# Patient Record
Sex: Female | Born: 1950 | Race: White | Hispanic: No | Marital: Married | Smoking: Never smoker
Health system: Southern US, Community
[De-identification: ages and names within clinical notes are randomized; demographics above are authoritative.]

## PROBLEM LIST (undated history)

## (undated) DIAGNOSIS — F419 Anxiety disorder, unspecified: Secondary | ICD-10-CM

## (undated) DIAGNOSIS — M1990 Unspecified osteoarthritis, unspecified site: Secondary | ICD-10-CM

## (undated) DIAGNOSIS — F329 Major depressive disorder, single episode, unspecified: Secondary | ICD-10-CM

## (undated) DIAGNOSIS — I639 Cerebral infarction, unspecified: Secondary | ICD-10-CM

## (undated) DIAGNOSIS — E538 Deficiency of other specified B group vitamins: Secondary | ICD-10-CM

## (undated) DIAGNOSIS — G4730 Sleep apnea, unspecified: Secondary | ICD-10-CM

## (undated) DIAGNOSIS — I10 Essential (primary) hypertension: Secondary | ICD-10-CM

## (undated) DIAGNOSIS — F32A Depression, unspecified: Secondary | ICD-10-CM

## (undated) DIAGNOSIS — Z8719 Personal history of other diseases of the digestive system: Secondary | ICD-10-CM

## (undated) DIAGNOSIS — J45909 Unspecified asthma, uncomplicated: Secondary | ICD-10-CM

## (undated) DIAGNOSIS — R0602 Shortness of breath: Secondary | ICD-10-CM

## (undated) DIAGNOSIS — J189 Pneumonia, unspecified organism: Secondary | ICD-10-CM

## (undated) DIAGNOSIS — K219 Gastro-esophageal reflux disease without esophagitis: Secondary | ICD-10-CM

## (undated) DIAGNOSIS — E7800 Pure hypercholesterolemia, unspecified: Secondary | ICD-10-CM

## (undated) HISTORY — DX: Unspecified asthma, uncomplicated: J45.909

## (undated) HISTORY — PX: KNEE ARTHROSCOPY: SUR90

## (undated) HISTORY — DX: Sleep apnea, unspecified: G47.30

## (undated) HISTORY — PX: UMBILICAL HERNIA REPAIR: SHX196

## (undated) HISTORY — PX: COLONOSCOPY: SHX174

## (undated) HISTORY — DX: Cerebral infarction, unspecified: I63.9

## (undated) HISTORY — PX: TUBAL LIGATION: SHX77

---

## 2011-03-11 ENCOUNTER — Other Ambulatory Visit: Payer: Self-pay | Admitting: Otolaryngology

## 2011-04-20 NOTE — H&P (Signed)
Chief Complaint:  right knee pain.  Subjective: Patient is admitted for right total knee arthroplasty.  Patient is a 61 y.o. female who has been seen by Dr. Shelle Iron for ongoing hip pain.  They have been followed and found to have continued progressive pain.  They have been treated conservatively in the past including medications.  Despite conservative measures, they continue to have pain.  X-rays show that the patient has tricompartmental OA of the right knee with slight valgus deformity.  It is felt that they would benefit from undergoing surgical intervention.  Risks and benefits have been discussed with the patient and they elect to proceed with surgery.  The patient has no contraindications to the upcoming procedure such as ongoing infection or progressive neurological disease.  Allergies: NKDA  Medications: Lisinopril Diclofenac Effexor Diclysomin Wellbutrin Advair Pro-air   Past Medical History: HTN Asthma Depression/Aniexty    Past Surgical History: Tubal ligation and hernia repair   Family History: Cancer. father Cerebrovascular Accident. mother and grandmother mothers side Depression. mother, father, sister, brother and grandfather fathers side Diabetes Mellitus. father Hypertension. mother, father, sister, brother, grandmother mothers side, grandfather mothers side, grandmother fathers side and grandfather fathers side Osteoporosis. grandfather mothers side and grandmother fathers side    Social History: Children. 3 Current work status. retired Financial planner (Currently). no Drug/Alcohol Rehab (Previously). no Exercise. Exercises weekly; does running / walking Illicit drug use. yes Living situation. live with spouse Marital status. married Number of flights of stairs before winded. greater than 5 Pain Contract. no Tobacco use. never smoker    Review of Systems General: No chills, fevers, night sweats, fatigue. Neuro:  No seizures,  syncope, paralysis, visual problems. Respiratory:  No shortness of breath, productive cough, hemoptysis. Cardiovascular: No chest pain, angina, palpitations, orthopnea. GI: No nausea, vomiting, diarrhea, constipation. GU:  No dysuria, hematuria, discharge. Musculoskeletal:  Joint pain   Physical Exam:  Vitals: Pulse:70 Respirations:10 Blood Pressure:96/70  GENERAL: Patient is a 61 y.o. female, well-nourished, well-developed, no acute distress. Alert, oriented, cooperative. HENT:  Normocephalic, atraumatic. Pupils round and reactive. EOMs intact. NECK:  Supple, no bruits. CHEST:  Clear to anterior and posterior chest walls. No rhonchi, rales, wheezes. HEART:  Regular, rate and rhythm.  No murmurs.  S1 and S2 noted. ABDOMEN:  Soft, nontender, bowel sounds present. RECTAL/BREAST/GENITALIA:  Not done, not pertinent to present illness. EXTREMITIES: TTP medial joint line right knee, mild effusion, -3 to 110, mild valgus deformity   Assessment/Plan: End stage arthritis, right knee  The patient is being admitted to [facility] to undergo a right total knee arthroplasty.  Surgery will be performed by Dr. Jene Every.  Risks and benefits have been discussed with the patient and they elect to proceed wth the procedure.

## 2011-04-27 ENCOUNTER — Encounter (HOSPITAL_COMMUNITY)
Admission: RE | Admit: 2011-04-27 | Discharge: 2011-04-27 | Disposition: A | Discharge status: Home / Self Care | Payer: BC Managed Care – PPO | Source: Ambulatory Visit | Attending: Specialist | Admitting: Specialist

## 2011-04-27 ENCOUNTER — Encounter (HOSPITAL_COMMUNITY): Payer: Self-pay

## 2011-04-27 ENCOUNTER — Ambulatory Visit (HOSPITAL_COMMUNITY)
Admission: RE | Admit: 2011-04-27 | Discharge: 2011-04-27 | Disposition: A | Discharge status: Home / Self Care | Payer: BC Managed Care – PPO | Source: Ambulatory Visit | Attending: Otolaryngology | Admitting: Otolaryngology

## 2011-04-27 ENCOUNTER — Ambulatory Visit (HOSPITAL_COMMUNITY)
Admission: RE | Admit: 2011-04-27 | Discharge: 2011-04-27 | Disposition: A | Discharge status: Home / Self Care | Payer: BC Managed Care – PPO | Source: Ambulatory Visit | Attending: Specialist | Admitting: Specialist

## 2011-04-27 DIAGNOSIS — M1710 Unilateral primary osteoarthritis, unspecified knee: Secondary | ICD-10-CM | POA: Insufficient documentation

## 2011-04-27 DIAGNOSIS — Z01818 Encounter for other preprocedural examination: Secondary | ICD-10-CM | POA: Insufficient documentation

## 2011-04-27 DIAGNOSIS — Z01812 Encounter for preprocedural laboratory examination: Secondary | ICD-10-CM | POA: Insufficient documentation

## 2011-04-27 HISTORY — DX: Depression, unspecified: F32.A

## 2011-04-27 HISTORY — DX: Essential (primary) hypertension: I10

## 2011-04-27 HISTORY — DX: Shortness of breath: R06.02

## 2011-04-27 HISTORY — DX: Deficiency of other specified B group vitamins: E53.8

## 2011-04-27 HISTORY — DX: Anxiety disorder, unspecified: F41.9

## 2011-04-27 HISTORY — DX: Unspecified osteoarthritis, unspecified site: M19.90

## 2011-04-27 HISTORY — DX: Personal history of other diseases of the digestive system: Z87.19

## 2011-04-27 HISTORY — DX: Pure hypercholesterolemia, unspecified: E78.00

## 2011-04-27 HISTORY — DX: Major depressive disorder, single episode, unspecified: F32.9

## 2011-04-27 HISTORY — DX: Gastro-esophageal reflux disease without esophagitis: K21.9

## 2011-04-27 HISTORY — DX: Pneumonia, unspecified organism: J18.9

## 2011-04-27 LAB — URINALYSIS, ROUTINE W REFLEX MICROSCOPIC
Bilirubin Urine: NEGATIVE
Hgb urine dipstick: NEGATIVE
Ketones, ur: NEGATIVE mg/dL
Nitrite: NEGATIVE
Specific Gravity, Urine: 1.008 (ref 1.005–1.030)
Urobilinogen, UA: 0.2 mg/dL (ref 0.0–1.0)

## 2011-04-27 LAB — COMPREHENSIVE METABOLIC PANEL
ALT: 21 U/L (ref 0–35)
Alkaline Phosphatase: 95 U/L (ref 39–117)
BUN: 19 mg/dL (ref 6–23)
CO2: 26 mEq/L (ref 19–32)
Chloride: 97 mEq/L (ref 96–112)
GFR calc Af Amer: 73 mL/min — ABNORMAL LOW (ref >90)
GFR calc non Af Amer: 63 mL/min — ABNORMAL LOW (ref >90)
Glucose, Bld: 125 mg/dL — ABNORMAL HIGH (ref 70–99)
Potassium: 4 mEq/L (ref 3.5–5.1)
Sodium: 133 mEq/L — ABNORMAL LOW (ref 135–145)
Total Bilirubin: 0.1 mg/dL — ABNORMAL LOW (ref 0.3–1.2)
Total Protein: 7.3 g/dL (ref 6.0–8.3)

## 2011-04-27 LAB — DIFFERENTIAL
Basophils Absolute: 0.1 10*3/uL (ref 0.0–0.1)
Basophils Relative: 2 % — ABNORMAL HIGH (ref 0–1)
Eosinophils Absolute: 0.5 10*3/uL (ref 0.0–0.7)
Eosinophils Relative: 6 % — ABNORMAL HIGH (ref 0–5)
Neutrophils Relative %: 63 % (ref 43–77)

## 2011-04-27 LAB — ABO/RH: ABO/RH(D): O POS

## 2011-04-27 LAB — CBC
HCT: 39.7 % (ref 36.0–46.0)
Hemoglobin: 13.6 g/dL (ref 12.0–15.0)
MCHC: 34.3 g/dL (ref 30.0–36.0)
RBC: 4.15 MIL/uL (ref 3.87–5.11)

## 2011-04-27 LAB — PROTIME-INR
INR: 0.93 (ref 0.00–1.49)
Prothrombin Time: 12.7 seconds (ref 11.6–15.2)

## 2011-04-27 LAB — APTT: aPTT: 30 seconds (ref 24–37)

## 2011-04-27 LAB — SURGICAL PCR SCREEN: Staphylococcus aureus: NEGATIVE

## 2011-04-27 IMAGING — CR DG KNEE 1-2V*R*
2 series · 2 of 2 positions shown · non-contrast
Comparison: None.

CLINICAL DATA: Preoperative evaluation prior to arthroplasty.

RIGHT KNEE - 1-2 VIEW [DATE]:

[t knee ap right]
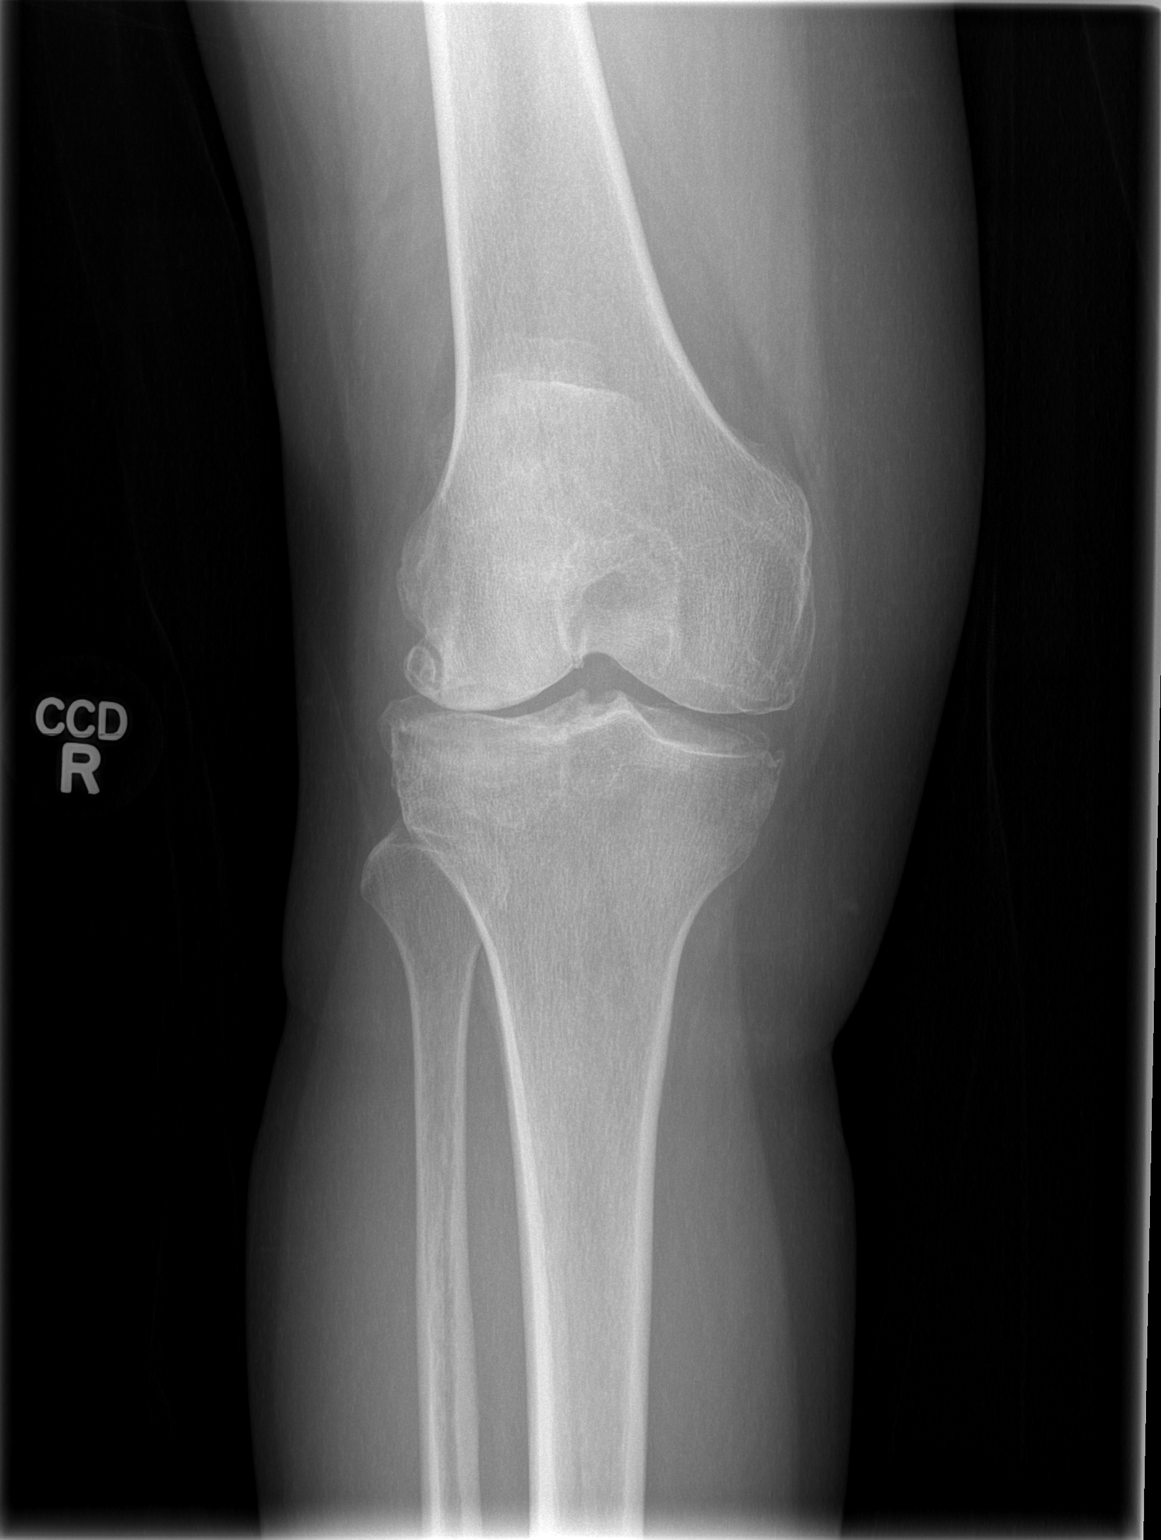

[t knee lat right]
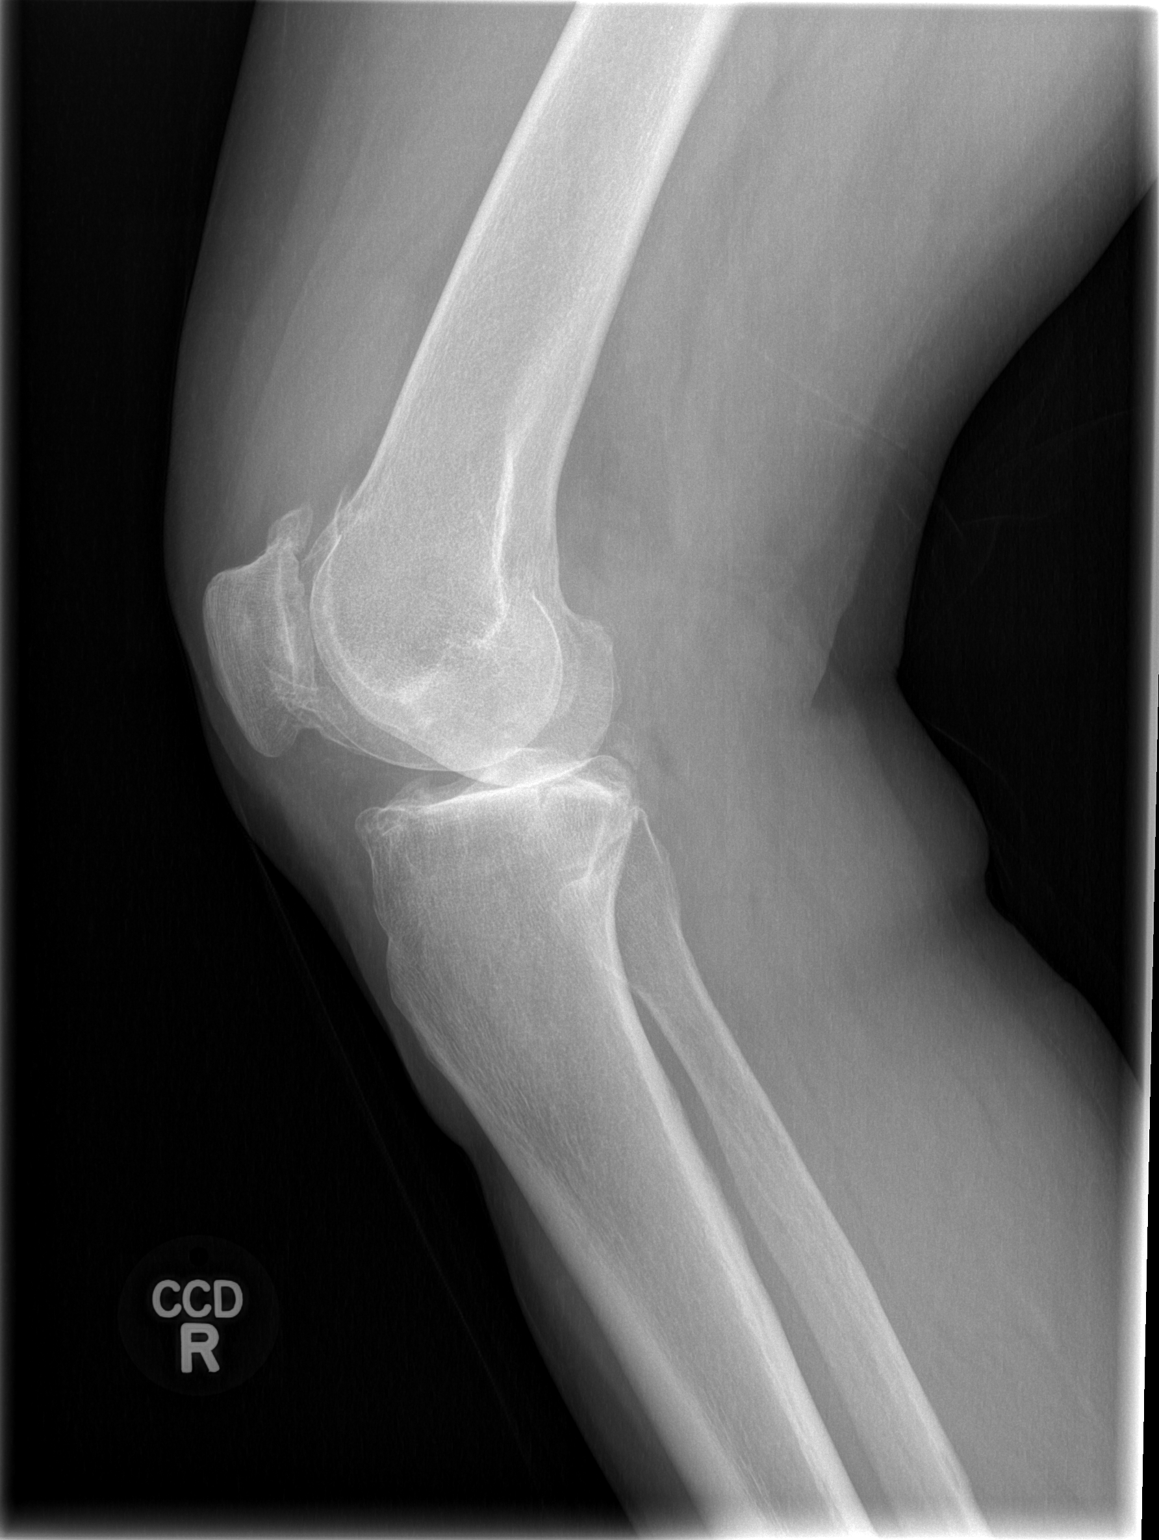

[2 of 2 positions shown; findings below may reference images not displayed]

FINDINGS: Near complete loss of the lateral compartment joint
space.  Severe patellofemoral compartment joint space narrowing and
mild medial compartment joint space narrowing.  Associated
hypertrophic spurring involving all compartments.  Moderately large
joint effusion.  Well-preserved bone mineral density.
IMPRESSION: Tricompartment osteoarthritis, worst in the lateral and
patellofemoral compartments.

## 2011-04-27 MED ORDER — CHLORHEXIDINE GLUCONATE 4 % EX LIQD: 60.0000 mL | Freq: Once | CUTANEOUS | Status: DC

## 2011-04-27 NOTE — Patient Instructions (Signed)
20 Chloe Gray  04/27/2011   Your procedure is scheduled on:  04/30/11  Surgery 0730-1000  Thursday  Report to [facility] at 0515 AM.  Call this number if you have problems the morning of surgery: [phone]   Remember:   Do not eat food:After Midnight.  Wednesday NIGHT  May have clear liquids:until Midnight . Wednesday NIGHT  Clear liquids include soda, tea, black coffee, apple or grape juice, broth.  Take these medicines the morning of surgery with A SIP OF WATER:  Beryle Flock, ZEGRID, EFFEXOR  With sip water     ADVAIR BEFORE YOU COME TO HOSPITAL     MAy use Proventil  If needed    BRING INHALERS WITH YOU TO HOSPITAL   Do not wear jewelry, make-up or nail polish.  Do not wear lotions, powders, or perfumes. You may wear deodorant.  Do not shave 48 hours prior to surgery.  Do not bring valuables to the hospital.  Contacts, dentures or bridgework may not be worn into surgery.  Leave suitcase in the car. After surgery it may be brought to your room.  For patients admitted to the hospital, checkout time is 11:00 AM the day of discharge.   Patients discharged the day of surgery will not be allowed to drive home.  Name and phone number of your driver: husband  Special Instructions: CHG Shower Use Special Wash: 1/2 bottle night before surgery and 1/2 bottle morning of surgery. Regular soap face and privates   Please read over the following fact sheets that you were given: MRSA Information

## 2011-04-30 ENCOUNTER — Encounter (HOSPITAL_COMMUNITY)
Admission: RE | Disposition: A | Discharge status: Home Health | Payer: Self-pay | Source: Ambulatory Visit | Attending: Specialist

## 2011-04-30 ENCOUNTER — Encounter (HOSPITAL_COMMUNITY): Payer: Self-pay | Admitting: Anesthesiology

## 2011-04-30 ENCOUNTER — Inpatient Hospital Stay (HOSPITAL_COMMUNITY): Payer: BC Managed Care – PPO | Admitting: Anesthesiology

## 2011-04-30 ENCOUNTER — Inpatient Hospital Stay (HOSPITAL_COMMUNITY)
Admission: RE | Admit: 2011-04-30 | Discharge: 2011-05-03 | DRG: 209 | Disposition: A | Discharge status: Home Health | Payer: BC Managed Care – PPO | Source: Ambulatory Visit | Attending: Specialist | Admitting: Specialist

## 2011-04-30 ENCOUNTER — Encounter (HOSPITAL_COMMUNITY): Payer: Self-pay | Admitting: *Deleted

## 2011-04-30 ENCOUNTER — Inpatient Hospital Stay (HOSPITAL_COMMUNITY): Payer: BC Managed Care – PPO

## 2011-04-30 DIAGNOSIS — M21869 Other specified acquired deformities of unspecified lower leg: Secondary | ICD-10-CM | POA: Diagnosis present

## 2011-04-30 DIAGNOSIS — M1710 Unilateral primary osteoarthritis, unspecified knee: Principal | ICD-10-CM | POA: Diagnosis present

## 2011-04-30 DIAGNOSIS — I10 Essential (primary) hypertension: Secondary | ICD-10-CM | POA: Diagnosis present

## 2011-04-30 DIAGNOSIS — K219 Gastro-esophageal reflux disease without esophagitis: Secondary | ICD-10-CM | POA: Diagnosis present

## 2011-04-30 DIAGNOSIS — M1711 Unilateral primary osteoarthritis, right knee: Secondary | ICD-10-CM | POA: Diagnosis present

## 2011-04-30 DIAGNOSIS — K449 Diaphragmatic hernia without obstruction or gangrene: Secondary | ICD-10-CM | POA: Diagnosis present

## 2011-04-30 DIAGNOSIS — E871 Hypo-osmolality and hyponatremia: Secondary | ICD-10-CM | POA: Diagnosis not present

## 2011-04-30 DIAGNOSIS — M1990 Unspecified osteoarthritis, unspecified site: Secondary | ICD-10-CM

## 2011-04-30 DIAGNOSIS — J45909 Unspecified asthma, uncomplicated: Secondary | ICD-10-CM | POA: Diagnosis present

## 2011-04-30 HISTORY — PX: TOTAL KNEE ARTHROPLASTY: SHX125

## 2011-04-30 LAB — TYPE AND SCREEN

## 2011-04-30 SURGERY — ARTHROPLASTY, KNEE, TOTAL
Anesthesia: General | Site: Knee | Laterality: Right | Wound class: Clean

## 2011-04-30 MED ORDER — PROPOFOL 10 MG/ML IV EMUL
INTRAVENOUS | Status: DC | PRN
  Administered 2011-04-30: 140 mg via INTRAVENOUS

## 2011-04-30 MED ORDER — ACETAMINOPHEN 650 MG RE SUPP: 650.0000 mg | Freq: Four times a day (QID) | RECTAL | Status: DC | PRN

## 2011-04-30 MED ORDER — SODIUM CHLORIDE 0.9 % IR SOLN
Status: DC | PRN
  Administered 2011-04-30: 3000 mL

## 2011-04-30 MED ORDER — PHENOL 1.4 % MT LIQD
1.0000 | OROMUCOSAL | Status: DC | PRN
  Filled 2011-04-30: qty 177

## 2011-04-30 MED ORDER — OXYCODONE HCL 5 MG PO TABS
5.0000 mg | ORAL_TABLET | ORAL | Status: DC
  Administered 2011-04-30 – 2011-05-02 (×12): 10 mg via ORAL
  Filled 2011-04-30 (×12): qty 2

## 2011-04-30 MED ORDER — BUPIVACAINE-EPINEPHRINE 0.5% -1:200000 IJ SOLN
INTRAMUSCULAR | Status: DC | PRN
  Administered 2011-04-30: 27 mL

## 2011-04-30 MED ORDER — DOCUSATE SODIUM 100 MG PO CAPS
100.0000 mg | ORAL_CAPSULE | Freq: Two times a day (BID) | ORAL | Status: DC
  Administered 2011-04-30 – 2011-05-03 (×7): 100 mg via ORAL
  Filled 2011-04-30 (×8): qty 1

## 2011-04-30 MED ORDER — ACETAMINOPHEN 325 MG PO TABS
650.0000 mg | ORAL_TABLET | Freq: Four times a day (QID) | ORAL | Status: DC | PRN
  Administered 2011-05-01: 650 mg via ORAL
  Filled 2011-04-30: qty 2

## 2011-04-30 MED ORDER — ACETAMINOPHEN 10 MG/ML IV SOLN
INTRAVENOUS | Status: DC | PRN
  Administered 2011-04-30: 1000 mg via INTRAVENOUS

## 2011-04-30 MED ORDER — MIDAZOLAM HCL 5 MG/5ML IJ SOLN
INTRAMUSCULAR | Status: DC | PRN
  Administered 2011-04-30: 2 mg via INTRAVENOUS

## 2011-04-30 MED ORDER — VENLAFAXINE HCL ER 150 MG PO CP24
150.0000 mg | ORAL_CAPSULE | Freq: Every day | ORAL | Status: DC
  Filled 2011-04-30: qty 1

## 2011-04-30 MED ORDER — SODIUM CHLORIDE 0.9 % IR SOLN
Status: DC | PRN
  Administered 2011-04-30: 1000 mL

## 2011-04-30 MED ORDER — LISINOPRIL-HYDROCHLOROTHIAZIDE 20-12.5 MG PO TABS: 1.0000 | ORAL_TABLET | Freq: Two times a day (BID) | ORAL | Status: DC

## 2011-04-30 MED ORDER — ALBUTEROL SULFATE HFA 108 (90 BASE) MCG/ACT IN AERS
2.0000 | INHALATION_SPRAY | Freq: Four times a day (QID) | RESPIRATORY_TRACT | Status: DC | PRN
  Filled 2011-04-30: qty 6.7

## 2011-04-30 MED ORDER — BUPROPION HCL ER (XL) 300 MG PO TB24: 450.0000 mg | ORAL_TABLET | Freq: Every day | ORAL | Status: DC

## 2011-04-30 MED ORDER — PANTOPRAZOLE SODIUM 40 MG PO PACK
40.0000 mg | PACK | Freq: Every day | ORAL | Status: DC
  Administered 2011-05-01 – 2011-05-02 (×2): 40 mg via ORAL
  Filled 2011-04-30 (×6): qty 20

## 2011-04-30 MED ORDER — BUPROPION HCL ER (XL) 300 MG PO TB24
300.0000 mg | ORAL_TABLET | Freq: Every day | ORAL | Status: DC
  Administered 2011-05-01 – 2011-05-03 (×3): 300 mg via ORAL
  Filled 2011-04-30 (×3): qty 1

## 2011-04-30 MED ORDER — METHOCARBAMOL 500 MG PO TABS
500.0000 mg | ORAL_TABLET | Freq: Four times a day (QID) | ORAL | Status: DC | PRN
  Administered 2011-05-01 – 2011-05-03 (×3): 500 mg via ORAL
  Filled 2011-04-30 (×4): qty 1

## 2011-04-30 MED ORDER — ONDANSETRON HCL 4 MG PO TABS: 4.0000 mg | ORAL_TABLET | Freq: Four times a day (QID) | ORAL | Status: DC | PRN

## 2011-04-30 MED ORDER — SUCCINYLCHOLINE CHLORIDE 20 MG/ML IJ SOLN
INTRAMUSCULAR | Status: DC | PRN
  Administered 2011-04-30: 100 mg via INTRAVENOUS

## 2011-04-30 MED ORDER — ONDANSETRON HCL 4 MG/2ML IJ SOLN
4.0000 mg | Freq: Four times a day (QID) | INTRAMUSCULAR | Status: DC | PRN
  Administered 2011-05-01: 4 mg via INTRAVENOUS
  Filled 2011-04-30: qty 2

## 2011-04-30 MED ORDER — MENTHOL 3 MG MT LOZG
1.0000 | LOZENGE | OROMUCOSAL | Status: DC | PRN
  Filled 2011-04-30: qty 9

## 2011-04-30 MED ORDER — HYDROMORPHONE HCL PF 1 MG/ML IJ SOLN
INTRAMUSCULAR | Status: AC
  Administered 2011-05-01: 1 mg via INTRAVENOUS
  Filled 2011-04-30: qty 1

## 2011-04-30 MED ORDER — LISINOPRIL 20 MG PO TABS
20.0000 mg | ORAL_TABLET | Freq: Two times a day (BID) | ORAL | Status: DC
  Administered 2011-04-30 – 2011-05-03 (×7): 20 mg via ORAL
  Filled 2011-04-30 (×8): qty 1

## 2011-04-30 MED ORDER — GABAPENTIN 400 MG PO CAPS
400.0000 mg | ORAL_CAPSULE | Freq: Three times a day (TID) | ORAL | Status: DC
  Administered 2011-04-30 – 2011-05-03 (×9): 400 mg via ORAL
  Filled 2011-04-30 (×11): qty 1

## 2011-04-30 MED ORDER — ALPRAZOLAM 1 MG PO TABS
1.0000 mg | ORAL_TABLET | Freq: Three times a day (TID) | ORAL | Status: DC
  Administered 2011-04-30 – 2011-05-03 (×7): 1 mg via ORAL
  Filled 2011-04-30 (×7): qty 1

## 2011-04-30 MED ORDER — BUPROPION HCL ER (XL) 150 MG PO TB24
150.0000 mg | ORAL_TABLET | Freq: Every day | ORAL | Status: DC
  Administered 2011-04-30 – 2011-05-02 (×3): 150 mg via ORAL
  Filled 2011-04-30 (×4): qty 1

## 2011-04-30 MED ORDER — CEFAZOLIN SODIUM-DEXTROSE 2-3 GM-% IV SOLR
2.0000 g | Freq: Four times a day (QID) | INTRAVENOUS | Status: AC
  Administered 2011-04-30 – 2011-05-01 (×3): 2 g via INTRAVENOUS
  Filled 2011-04-30 (×5): qty 50

## 2011-04-30 MED ORDER — FENTANYL CITRATE 0.05 MG/ML IJ SOLN
INTRAMUSCULAR | Status: DC | PRN
  Administered 2011-04-30 (×3): 50 ug via INTRAVENOUS

## 2011-04-30 MED ORDER — SODIUM CHLORIDE 0.9 % IV SOLN
INTRAVENOUS | Status: DC
  Administered 2011-04-30: 20:00:00 via INTRAVENOUS
  Administered 2011-04-30: 100 mL/h via INTRAVENOUS
  Administered 2011-05-01 (×2): via INTRAVENOUS

## 2011-04-30 MED ORDER — CEFAZOLIN SODIUM-DEXTROSE 2-3 GM-% IV SOLR
2.0000 g | INTRAVENOUS | Status: AC
  Administered 2011-04-30: 2 g via INTRAVENOUS

## 2011-04-30 MED ORDER — FLUTICASONE-SALMETEROL 250-50 MCG/DOSE IN AEPB
1.0000 | INHALATION_SPRAY | Freq: Two times a day (BID) | RESPIRATORY_TRACT | Status: DC
  Administered 2011-04-30 – 2011-05-03 (×5): 1 via RESPIRATORY_TRACT
  Filled 2011-04-30 (×3): qty 14

## 2011-04-30 MED ORDER — RIVAROXABAN 10 MG PO TABS
10.0000 mg | ORAL_TABLET | Freq: Every day | ORAL | Status: DC
  Administered 2011-05-01 – 2011-05-03 (×3): 10 mg via ORAL
  Filled 2011-04-30 (×3): qty 1

## 2011-04-30 MED ORDER — HYDROCHLOROTHIAZIDE 12.5 MG PO CAPS
12.5000 mg | ORAL_CAPSULE | Freq: Every day | ORAL | Status: DC
  Administered 2011-04-30 – 2011-05-01 (×2): 12.5 mg via ORAL
  Filled 2011-04-30 (×3): qty 1

## 2011-04-30 MED ORDER — SODIUM CHLORIDE 0.9 % IR SOLN
Status: DC | PRN
  Administered 2011-04-30: 08:00:00

## 2011-04-30 MED ORDER — LACTATED RINGERS IV SOLN
INTRAVENOUS | Status: DC
  Administered 2011-04-30 (×2): via INTRAVENOUS

## 2011-04-30 MED ORDER — ROPIVACAINE HCL 5 MG/ML IJ SOLN
INTRAMUSCULAR | Status: DC | PRN
  Administered 2011-04-30: 30 mL

## 2011-04-30 MED ORDER — DIPHENHYDRAMINE HCL 12.5 MG/5ML PO ELIX
12.5000 mg | ORAL_SOLUTION | ORAL | Status: DC | PRN
  Administered 2011-05-02 (×2): 12.5 mg via ORAL
  Filled 2011-04-30 (×2): qty 5

## 2011-04-30 MED ORDER — VENLAFAXINE HCL ER 150 MG PO CP24
225.0000 mg | ORAL_CAPSULE | Freq: Every day | ORAL | Status: DC
  Administered 2011-04-30 – 2011-05-02 (×3): 225 mg via ORAL
  Filled 2011-04-30 (×4): qty 1

## 2011-04-30 MED ORDER — BUPROPION HCL ER (XL) 150 MG PO TB24
150.0000 mg | ORAL_TABLET | Freq: Every day | ORAL | Status: DC
  Administered 2011-04-30: 150 mg via ORAL
  Filled 2011-04-30: qty 1

## 2011-04-30 MED ORDER — VENLAFAXINE HCL ER 150 MG PO CP24: 150.0000 mg | ORAL_CAPSULE | Freq: Two times a day (BID) | ORAL | Status: DC

## 2011-04-30 MED ORDER — METOCLOPRAMIDE HCL 5 MG/ML IJ SOLN: 5.0000 mg | Freq: Three times a day (TID) | INTRAMUSCULAR | Status: DC | PRN

## 2011-04-30 MED ORDER — HYDROMORPHONE HCL PF 1 MG/ML IJ SOLN
0.2500 mg | INTRAMUSCULAR | Status: DC | PRN
  Administered 2011-04-30 (×2): 0.25 mg via INTRAVENOUS
  Administered 2011-04-30: 0.5 mg via INTRAVENOUS

## 2011-04-30 MED ORDER — ALBUTEROL SULFATE (5 MG/ML) 0.5% IN NEBU: 2.5000 mg | INHALATION_SOLUTION | RESPIRATORY_TRACT | Status: DC | PRN

## 2011-04-30 MED ORDER — PROMETHAZINE HCL 25 MG/ML IJ SOLN: 6.2500 mg | INTRAMUSCULAR | Status: DC | PRN

## 2011-04-30 MED ORDER — HYDROMORPHONE HCL PF 1 MG/ML IJ SOLN
0.5000 mg | INTRAMUSCULAR | Status: DC | PRN
  Administered 2011-04-30 – 2011-05-02 (×5): 1 mg via INTRAVENOUS
  Filled 2011-04-30 (×6): qty 1

## 2011-04-30 MED ORDER — VENLAFAXINE HCL ER 150 MG PO CP24
150.0000 mg | ORAL_CAPSULE | Freq: Every day | ORAL | Status: DC
  Administered 2011-05-01 – 2011-05-03 (×3): 150 mg via ORAL
  Filled 2011-04-30 (×3): qty 1

## 2011-04-30 MED ORDER — BUPROPION HCL ER (XL) 300 MG PO TB24
300.0000 mg | ORAL_TABLET | Freq: Every day | ORAL | Status: DC
  Filled 2011-04-30: qty 1

## 2011-04-30 MED ORDER — METOCLOPRAMIDE HCL 10 MG PO TABS: 5.0000 mg | ORAL_TABLET | Freq: Three times a day (TID) | ORAL | Status: DC | PRN

## 2011-04-30 MED ORDER — NON FORMULARY: 40.0000 mg | Freq: Every day | Status: DC

## 2011-04-30 MED ORDER — ONDANSETRON HCL 4 MG/2ML IJ SOLN
INTRAMUSCULAR | Status: DC | PRN
  Administered 2011-04-30: 4 mg via INTRAVENOUS

## 2011-04-30 MED ORDER — METHOCARBAMOL 100 MG/ML IJ SOLN
500.0000 mg | Freq: Four times a day (QID) | INTRAVENOUS | Status: DC | PRN
  Administered 2011-04-30 – 2011-05-01 (×2): 500 mg via INTRAVENOUS
  Filled 2011-04-30 (×2): qty 5

## 2011-04-30 SURGICAL SUPPLY — 61 items
BAG ZIPLOCK 12X15 (MISCELLANEOUS) ×2 IMPLANT
BANDAGE ELASTIC 4 VELCRO ST LF (GAUZE/BANDAGES/DRESSINGS) ×2 IMPLANT
BANDAGE ELASTIC 6 VELCRO ST LF (GAUZE/BANDAGES/DRESSINGS) ×2 IMPLANT
BANDAGE ESMARK 6X9 LF (GAUZE/BANDAGES/DRESSINGS) ×1 IMPLANT
BLADE SAG 18X100X1.27 (BLADE) ×2 IMPLANT
BLADE SAW SGTL 13.0X1.19X90.0M (BLADE) ×2 IMPLANT
BNDG ESMARK 6X9 LF (GAUZE/BANDAGES/DRESSINGS) ×2
CEMENT HV SMART SET (Cement) ×4 IMPLANT
CHLORAPREP W/TINT 26ML (MISCELLANEOUS) IMPLANT
CLOTH BEACON ORANGE TIMEOUT ST (SAFETY) ×2 IMPLANT
CUFF TOURN SGL QUICK 34 (TOURNIQUET CUFF) ×1
CUFF TRNQT CYL 34X4X40X1 (TOURNIQUET CUFF) ×1 IMPLANT
DECANTER SPIKE VIAL GLASS SM (MISCELLANEOUS) ×2 IMPLANT
DRAPE LG THREE QUARTER DISP (DRAPES) ×2 IMPLANT
DRAPE ORTHO SPLIT 77X108 STRL (DRAPES) ×2
DRAPE POUCH INSTRU U-SHP 10X18 (DRAPES) ×2 IMPLANT
DRAPE SURG ORHT 6 SPLT 77X108 (DRAPES) ×2 IMPLANT
DRAPE U-SHAPE 47X51 STRL (DRAPES) ×2 IMPLANT
DRSG ADAPTIC 3X8 NADH LF (GAUZE/BANDAGES/DRESSINGS) ×2 IMPLANT
DRSG PAD ABDOMINAL 8X10 ST (GAUZE/BANDAGES/DRESSINGS) ×2 IMPLANT
DURAPREP 26ML APPLICATOR (WOUND CARE) ×2 IMPLANT
ELECT REM PT RETURN 9FT ADLT (ELECTROSURGICAL) ×2
ELECTRODE REM PT RTRN 9FT ADLT (ELECTROSURGICAL) ×1 IMPLANT
EVACUATOR 1/8 PVC DRAIN (DRAIN) ×2 IMPLANT
FACESHIELD LNG OPTICON STERILE (SAFETY) ×10 IMPLANT
GLOVE BIO SURGEON STRL SZ 6.5 (GLOVE) IMPLANT
GLOVE BIOGEL PI IND STRL 6.5 (GLOVE) ×1 IMPLANT
GLOVE BIOGEL PI IND STRL 8 (GLOVE) ×1 IMPLANT
GLOVE BIOGEL PI INDICATOR 6.5 (GLOVE) ×1
GLOVE BIOGEL PI INDICATOR 8 (GLOVE) ×1
GLOVE ECLIPSE 6.5 STRL STRAW (GLOVE) IMPLANT
GLOVE SURG SS PI 6.5 STRL IVOR (GLOVE) ×12 IMPLANT
GLOVE SURG SS PI 7.5 STRL IVOR (GLOVE) ×4 IMPLANT
GLOVE SURG SS PI 8.0 STRL IVOR (GLOVE) ×4 IMPLANT
GOWN PREVENTION PLUS LG XLONG (DISPOSABLE) ×6 IMPLANT
GOWN PREVENTION PLUS XLARGE (GOWN DISPOSABLE) IMPLANT
GOWN STRL REIN XL XLG (GOWN DISPOSABLE) ×4 IMPLANT
HANDPIECE INTERPULSE COAX TIP (DISPOSABLE) ×1
IMMOBILIZER KNEE 20 (SOFTGOODS) ×2
IMMOBILIZER KNEE 20 THIGH 36 (SOFTGOODS) ×1 IMPLANT
KIT BASIN OR (CUSTOM PROCEDURE TRAY) ×2 IMPLANT
MANIFOLD NEPTUNE II (INSTRUMENTS) ×2 IMPLANT
NS IRRIG 1000ML POUR BTL (IV SOLUTION) ×2 IMPLANT
PACK TOTAL JOINT (CUSTOM PROCEDURE TRAY) ×2 IMPLANT
PADDING CAST COTTON 6X4 STRL (CAST SUPPLIES) ×2 IMPLANT
POSITIONER SURGICAL ARM (MISCELLANEOUS) ×2 IMPLANT
SET HNDPC FAN SPRY TIP SCT (DISPOSABLE) ×1 IMPLANT
SPONGE GAUZE 4X4 12PLY (GAUZE/BANDAGES/DRESSINGS) ×2 IMPLANT
SPONGE SURGIFOAM ABS GEL 100 (HEMOSTASIS) ×2 IMPLANT
STAPLER VISISTAT (STAPLE) ×2 IMPLANT
SUCTION FRAZIER 12FR DISP (SUCTIONS) ×2 IMPLANT
SUT BONE WAX W31G (SUTURE) ×2 IMPLANT
SUT VIC AB 1 CT1 27 (SUTURE) ×4
SUT VIC AB 1 CT1 27XBRD ANTBC (SUTURE) ×4 IMPLANT
SUT VIC AB 2-0 CT1 27 (SUTURE) ×3
SUT VIC AB 2-0 CT1 TAPERPNT 27 (SUTURE) ×3 IMPLANT
SYR 30ML LL (SYRINGE) ×2 IMPLANT
TOWER CARTRIDGE SMART MIX (DISPOSABLE) ×2 IMPLANT
TRAY FOLEY CATH 14FRSI W/METER (CATHETERS) ×2 IMPLANT
WATER STERILE IRR 1500ML POUR (IV SOLUTION) ×2 IMPLANT
WRAP KNEE MAXI GEL POST OP (GAUZE/BANDAGES/DRESSINGS) ×2 IMPLANT

## 2011-04-30 NOTE — Anesthesia Postprocedure Evaluation (Signed)
  Anesthesia Post-op Note  Patient: Chloe Gray  Procedure(s) Performed:  TOTAL KNEE ARTHROPLASTY - Femoral nerve block  Patient Location: PACU  Anesthesia Type: General  Level of Consciousness: awake and alert   Airway and Oxygen Therapy: Patient Spontanous Breathing  Post-op Pain: mild  Post-op Assessment: Post-op Vital signs reviewed, Patient's Cardiovascular Status Stable, Respiratory Function Stable, Patent Airway and No signs of Nausea or vomiting  Post-op Vital Signs: stable  Complications: No apparent anesthesia complications

## 2011-04-30 NOTE — Interval H&P Note (Signed)
History and Physical Interval Note:  04/30/2011 7:21 AM  Chloe Gray  has presented today for surgery, with the diagnosis of osteoarthritis right knee  The various methods of treatment have been discussed with the patient and family. After consideration of risks, benefits and other options for treatment, the patient has consented to  Procedure(s): TOTAL KNEE ARTHROPLASTY as a surgical intervention .  The patients' history has been reviewed, patient examined, no change in status, stable for surgery.  I have reviewed the patients' chart and labs.  Questions were answered to the patient's satisfaction.     [name] C

## 2011-04-30 NOTE — Transfer of Care (Signed)
Immediate Anesthesia Transfer of Care Note  Patient: Chloe Gray  Procedure(s) Performed:  TOTAL KNEE ARTHROPLASTY - Femoral nerve block  Patient Location: PACU  Anesthesia Type: GA combined with regional for post-op pain  Level of Consciousness: awake, sedated and patient cooperative  Airway & Oxygen Therapy: Patient Spontanous Breathing and Patient connected to face mask oxygen  Post-op Assessment: Report given to PACU RN and Post -op Vital signs reviewed and stable  Post vital signs: Reviewed and stable  Complications: No apparent anesthesia complications

## 2011-04-30 NOTE — Anesthesia Procedure Notes (Signed)
Anesthesia Regional Block:  Femoral nerve block  Pre-Anesthetic Checklist: ,, timeout performed, Correct Patient, Correct Site, Correct Laterality, Correct Procedure, Correct Position, site marked, Risks and benefits discussed,  Surgical consent,  Pre-op evaluation,  At surgeon's request and post-op pain management  Laterality: Right  Prep: chloraprep       Needles:  Injection technique: Single-shot  Needle Type: Stimiplex     Needle Length:cm 9 cm Needle Gauge: 21 G    Additional Needles:  Procedures: ultrasound guided Femoral nerve block Narrative:  Start time: 04/30/2011 7:01 AM End time: 04/30/2011 7:11 AM Injection made incrementally with aspirations every 5 mL.  Performed by: Personally  Anesthesiologist: Eilene Ghazi MD  Additional Notes: Patient tolerated the procedure well without complications  Femoral nerve block

## 2011-04-30 NOTE — Brief Op Note (Signed)
04/30/2011  9:26 AM  PATIENT:  Chloe Gray  61 y.o. female  PRE-OPERATIVE DIAGNOSIS:  osteoarthritis right knee  POST-OPERATIVE DIAGNOSIS:  osteoarthritis right knee  PROCEDURE:  Procedure(s): TOTAL KNEE ARTHROPLASTY  SURGEON:  Surgeon(s): Javier Docker, MD  PHYSICIAN ASSISTANT:   ASSISTANTS: Strader   ANESTHESIA:   general  EBL:  Total I/O In: 1000 [I.V.:1000] Out: 250 [Urine:250]  BLOOD ADMINISTERED:none  DRAINS: (1) Hemovact drain(s) in the o with  Suction Open   LOCAL MEDICATIONS USED:  MARCAINE 25CC  SPECIMEN:  No Specimen  DISPOSITION OF SPECIMEN:  N/A  COUNTS:  YES  TOURNIQUET:   Total Tourniquet Time Documented: Thigh (Right) - 74 minutes  DICTATION: .Other Dictation: Dictation Number [id number]  PLAN OF CARE: Admit to inpatient   PATIENT DISPOSITION:  PACU - hemodynamically stable.   Delay start of Pharmacological VTE agent (>24hrs) due to surgical blood loss or risk of bleeding:  {YES/NO/NOT APPLICABLE:20182

## 2011-04-30 NOTE — Anesthesia Preprocedure Evaluation (Addendum)
Anesthesia Evaluation    Airway Mallampati: II TM Distance: >3 FB Neck ROM: Full    Dental No notable dental hx.    Pulmonary asthma ,  clear to auscultation  Pulmonary exam normal       Cardiovascular hypertension, Pt. on medications Regular Normal    Neuro/Psych    GI/Hepatic hiatal hernia, GERD-  ,  Endo/Other    Renal/GU      Musculoskeletal   Abdominal   Peds  Hematology   Anesthesia Other Findings   Reproductive/Obstetrics                           Anesthesia Physical Anesthesia Plan  ASA: II  Anesthesia Plan: General   Post-op Pain Management:    Induction: Intravenous  Airway Management Planned: Oral ETT  Additional Equipment:   Intra-op Plan:   Post-operative Plan: Extubation in OR  Informed Consent: I have reviewed the patients History and Physical, chart, labs and discussed the procedure including the risks, benefits and alternatives for the proposed anesthesia with the patient or authorized representative who has indicated his/her understanding and acceptance.   Dental advisory given  Plan Discussed with: CRNA  Anesthesia Plan Comments:        Anesthesia Quick Evaluation

## 2011-05-01 ENCOUNTER — Encounter (HOSPITAL_COMMUNITY): Payer: Self-pay | Admitting: Specialist

## 2011-05-01 DIAGNOSIS — M1710 Unilateral primary osteoarthritis, unspecified knee: Secondary | ICD-10-CM | POA: Diagnosis present

## 2011-05-01 DIAGNOSIS — M1711 Unilateral primary osteoarthritis, right knee: Secondary | ICD-10-CM | POA: Diagnosis present

## 2011-05-01 HISTORY — DX: Unilateral primary osteoarthritis, right knee: M17.11

## 2011-05-01 LAB — CBC
Platelets: 314 10*3/uL (ref 150–400)
RBC: 3.45 MIL/uL — ABNORMAL LOW (ref 3.87–5.11)
WBC: 12.7 10*3/uL — ABNORMAL HIGH (ref 4.0–10.5)

## 2011-05-01 LAB — BASIC METABOLIC PANEL
CO2: 26 mEq/L (ref 19–32)
Chloride: 94 mEq/L — ABNORMAL LOW (ref 96–112)
Sodium: 128 mEq/L — ABNORMAL LOW (ref 135–145)

## 2011-05-01 MED ORDER — RIVAROXABAN 10 MG PO TABS: 10.0000 mg | ORAL_TABLET | Freq: Every day | ORAL | Status: DC

## 2011-05-01 MED ORDER — OXYCODONE HCL 5 MG PO TABS: 5.0000 mg | ORAL_TABLET | ORAL | Status: AC

## 2011-05-01 MED ORDER — DSS 100 MG PO CAPS: 100.0000 mg | ORAL_CAPSULE | Freq: Two times a day (BID) | ORAL | Status: AC

## 2011-05-01 NOTE — Op Note (Signed)
NAMEMarland Kitchen  Chloe Gray, Chloe Gray NO.:  [id number]  MEDICAL RECORD NO.:  [id number]  LOCATION:  1601                         FACILITY:  [facility]  PHYSICIAN:  Jene Every, M.D.    DATE OF BIRTH:  [birth year]  DATE OF PROCEDURE:  04/30/2011 DATE OF DISCHARGE:                              OPERATIVE REPORT   PREOPERATIVE DIAGNOSIS:  End-stage osteoarthritis with the varus deformity of the right knee.  POSTOPERATIVE DIAGNOSIS:  End-stage osteoarthritis with the varus deformity of the right knee.  PROCEDURE PERFORMED:  Right total knee arthroplasty.  ANESTHESIA:  General.  ASSISTANT:  Roma Schanz, P.A.  HISTORY:  A 61 year old woman with end-stage bone-on-bone arthritis, refractory to conservative treatment, indicated for replacement of degenerative joint.  Risks and benefits discussed including bleeding, infection, damage to neurovascular structures, suboptimal range of motion, DVT, PE, anesthetic complications, need for manipulation etc.  TECHNIQUE:  Patient in supine position, after induction of adequate general anesthesia, 2 g of Kefzol, the right lower extremity was prepped and draped, and exsanguinated in the usual sterile fashion, titrated to 250 mmHg.  Midline incision was made, knee flexed, medial parapatellar arthrotomy performed.  Tricompartmental bone-on-bone osteoarthritis was noted.  Medial and lateral menisci remnants and ACL were removed, step drill was utilized to enter the femoral canal and irrigated.  The __________ reamer placed 5 degrees right.  Oscillating saw performed the distal femoral cut.  Sizer was then incised off the anterior cortex to a 3 and pinned in 3 degrees of external rotation.  The anterior and posterior chamfer cuts performed, soft tissues protected at all times. Attention turned towards the tibia, the subluxed external alignment guide 10 off high side, which was medial and bisecting the ankle joint parallel to the tibial shaft  and oscillating saw performed that cut. Next, the flexion and extension gaps were tested and they were equivalent utilized a 10 spacer.  We then completed the tibia maximizing the component to a 3, just to the medial portion of the tibial tubercle. Maximal coverage, pin drilled, punch guide utilized.  We then completed the box cut of the femur protecting the soft tissues.  We then placed a trial of femur and tibia 10-mm insert, full extension and full flexion. Good stability of varus and valgus stress at 0 and 30 degrees for range. Patellofemoral tracking was unremarkable.  We then cleaned the patella, sized to a 15  from a 23.  Drilled our peg holes  medializing this and placed a trial patella.  We then checked patellofemoral tracking, which was satisfactory.  All instrumentation was then removed, checked posteriorly.  No residual intervening soft tissue.  __________ cauterized pulsatile lavage cleaning the joint.  Cement mixed on the back table in appropriate fashion.  Knee was flexed.  All surfaces thoroughly dried.  Cement was injected into the proximal tibia, digitally pressurized.  We impacted the 3 tibial tray and then cemented femur with 10-mm insert reduced, held axial load throughout the curing of the cement.  The __________ patella and clamped.  After curing of the cement, all redundant cement was then removed.  A trialed of 10 to be optimal, then removed that and again, redundant cement  removed, placing a  permanent 10 insert after copious irrigation with flexion, good extension, good patellofemoral tracking, good stability to varus and valgus stress from 0 to 30 degrees, negative anterior drawer.  Next, Hemovac placed and brought out through a stab wound in the skin. Marcaine with epinephrine injected into the joint.  Patellar arthrotomy with #1 Vicryl interrupted figure-of-8 suture subcutaneous and #2-0 Vicryl sutures.  Skin was reapproximated with 4-0 subcuticular  Prolene. Wound reinforced with Steri-Strips, sterile dressing applied, placed supine on the hospital bed, extubated without difficulty and transported to the recovery room in satisfactory condition.  She had flexion to 90 degrees against gravity.  Patient tolerated the procedure well and no complications.  Tourniquet time was 74 minutes. No blood loss.     Jene Every, M.D.     Cordelia Pen  D:  04/30/2011  T:  05/01/2011  Job:  [id number]

## 2011-05-01 NOTE — Progress Notes (Signed)
  CARE MANAGEMENT NOTE 05/01/2011  Patient:  SOLARIS, KRAM   Account Number:  [id number]  Date Initiated:  05/01/2011  Documentation initiated by:  Colleen Can  Subjective/Objective Assessment:   DX: osteoarthritis rt knee:  TOTAL KNEE REPLACEMNT     Action/Plan:   CM SPOKE WITH PATIENT AND SPOUSE. zcurrent plans are for patient to return to her home in [place] where her spouse will be caregiver. Patient already has access to DME   Anticipated DC Date:  05/03/2011   Anticipated DC Plan:  HOME W HOME HEALTH SERVICES  In-house referral  NA      DC Planning Services  CM consult      [facility] Choice  HOME HEALTH   Choice offered to / List presented to:  C-3 Spouse   DME arranged  NA      DME agency  NA        Status of service:  In process, will continue to follow Medicare Important Message given?   (If response is "NO", the following Medicare IM given date fields will be blank) Date Medicare IM given:   Date Additional Medicare IM given:    Discharge Disposition:    Per UR Regulation:    Comments:  05/01/2011 Raynelle Bring BSN CCM [phone] Spouse wants to review list of agencies with his daughter before making a decision. Wll advise wekend case manager to follow up for [facility] choice. List of agencies placed in shadow chart.

## 2011-05-01 NOTE — Evaluation (Signed)
Physical Therapy Evaluation Patient Details Name: Chloe Gray MRN: [id number] DOB: [birth year] Today's Date: 05/01/2011  [phone] EVII, TA  Problem List: There is no problem list on file for this patient.   Past Medical History:  Past Medical History  Diagnosis Date  . Vitamin B 12 deficiency   . Hypercholesterolemia   . Shortness of breath     with asthma  . Pneumonia   . GERD (gastroesophageal reflux disease)   . H/O hiatal hernia   . Arthritis   . Anxiety   . Depression     states well controlled  . Hypertension     clearance note with OV Dr Lorin Picket, EKG on chart 03/09/11  . Asthma     PFT 12/12  PCP on chart   Past Surgical History:  Past Surgical History  Procedure Date  . Umbilical hernia repair   . Tubal ligation   . Colonoscopy   . Knee arthroscopy     PT Assessment/Plan/Recommendation PT Assessment Clinical Impression Statement: Pt presents s/p R TKR POD 1 with decreased strength and mobility.  PT noted increased anxiety with mobility/ambulation, however, pt did well as ambulation continued.  Pt will benefit from skilled PT in acute venue in order to address mobility/strength deficits.  PT recommends HHPT for pt at D/C for follow up therapy.   PT Recommendation/Assessment: Patient will need skilled PT in the acute care venue PT Problem List: Decreased strength;Decreased range of motion;Decreased activity tolerance;Decreased balance;Decreased mobility;Decreased knowledge of use of DME;Pain Barriers to Discharge: None PT Therapy Diagnosis : Difficulty walking;Abnormality of gait;Generalized weakness;Acute pain PT Plan PT Frequency: 7X/week PT Treatment/Interventions: DME instruction;Gait training;Stair training;Functional mobility training;Therapeutic activities;Therapeutic exercise;Patient/family education PT Recommendation Follow Up Recommendations: Home health PT Equipment Recommended: None recommended by PT PT Goals  Acute Rehab PT Goals PT Goal  Formulation: With patient Time For Goal Achievement: 7 days Pt will go Supine/Side to Sit: with supervision PT Goal: Supine/Side to Sit - Progress: Goal set today Pt will go Sit to Supine/Side: with supervision PT Goal: Sit to Supine/Side - Progress: Goal set today Pt will go Sit to Stand: with supervision PT Goal: Sit to Stand - Progress: Goal set today Pt will go Stand to Sit: with supervision PT Goal: Stand to Sit - Progress: Goal set today Pt will Ambulate: 51 - 150 feet;with supervision;with least restrictive assistive device PT Goal: Ambulate - Progress: Goal set today Pt will Go Up / Down Stairs: 3-5 stairs;with min assist;with least restrictive assistive device PT Goal: Up/Down Stairs - Progress: Goal set today Pt will Perform Home Exercise Program: with supervision, verbal cues required/provided PT Goal: Perform Home Exercise Program - Progress: Goal set today  PT Evaluation Precautions/Restrictions  Precautions Required Braces or Orthoses: Yes Knee Immobilizer: Discontinue once straight leg raise with < 10 degree lag Restrictions Weight Bearing Restrictions: Yes RUE Weight Bearing: Weight bearing as tolerated Prior Functioning  Home Living Lives With: Spouse Receives Help From: Family Type of Home: House Home Layout: One level Home Access: Stairs to enter Entrance Stairs-Rails: Left Entrance Stairs-Number of Steps: 3 Home Adaptive Equipment: Bedside commode/3-in-1;Walker - rolling Prior Function Level of Independence: Independent with basic ADLs;Independent with gait;Independent with transfers Driving: Yes Cognition Cognition Arousal/Alertness: Awake/alert Overall Cognitive Status: Appears within functional limits for tasks assessed Orientation Level: Oriented X4 Sensation/Coordination Sensation Light Touch: Appears Intact Coordination Gross Motor Movements are Fluid and Coordinated: Yes Extremity Assessment RLE Assessment RLE Assessment: Exceptions to  [facility] RLE Strength RLE Overall Strength Comments: Ankle motions  WFL, required assist for SLR with increased pain noted.  LLE Assessment LLE Assessment: Within Functional Limits Mobility (including Balance) Bed Mobility Bed Mobility: Yes Supine to Sit: 1: +2 Total assist;Patient percentage (comment);HOB elevated (Comment degrees) Supine to Sit Details (indicate cue type and reason): Pt assist 50%, +2 assist for B LE and to elevate trunk into sitting position.  Cues for hand placement and breathing to control pain.  Transfers Transfers: Yes Sit to Stand: 1: +2 Total assist;Patient percentage (comment);With upper extremity assist;From bed Sit to Stand Details (indicate cue type and reason): Pt assist 50-60%.  +2 assist for forward weight shift with cues for hand placement and LE management.   Stand to Sit: 1: +2 Total assist;With upper extremity assist;With armrests;To chair/3-in-1 Stand to Sit Details: Pt assist 70%.  +2 for controlled descent with assist for R LE and cues for hand placement and RLE management.  Ambulation/Gait Ambulation/Gait: Yes Ambulation/Gait Assistance: 1: +2 Total assist;Patient percentage (comment) Ambulation/Gait Assistance Details (indicate cue type and reason): Pt assist 70%.  + 2 for safety and line management with cues for sequencing/technique with RW and for pursed lip breathing to control pain/anxiety.  Ambulation Distance (Feet): 20 Feet Assistive device: Rolling walker Gait Pattern: Step-to pattern;Decreased stride length Gait velocity: decreased    Exercise    End of Session PT - End of Session Equipment Utilized During Treatment: Gait belt;Right knee immobilizer Activity Tolerance: Patient limited by pain Patient left: in chair;with call bell in reach;with family/visitor present Nurse Communication: Mobility status for transfers;Mobility status for ambulation General Behavior During Session: [facility] for tasks performed Cognition: [facility] for tasks  performed  Page, Meribeth Mattes 05/01/2011, 9:08 AM

## 2011-05-01 NOTE — Progress Notes (Signed)
05/01/11 Foley discontinued per md order.

## 2011-05-01 NOTE — Progress Notes (Signed)
Subjective: 1 Day Post-Op Procedure(s) (LRB): TOTAL KNEE ARTHROPLASTY (Right) Patient reports pain as 8 on 0-10 scale.  Denies CP or SOB  Patient has complaints of pain as expected.  We will start therapy today. Plan is to go home after hospital stay.  Objective: Vital signs in last 24 hours: Temp:  [97.4 F (36.3 C)-98.9 F (37.2 C)] 98.9 F (37.2 C) (02/01 0610) Pulse Rate:  [79-101] 101  (02/01 0610) Resp:  [12-16] 16  (02/01 0610) BP: (124-151)/(68-89) 150/89 mmHg (02/01 0610) SpO2:  [93 %-100 %] 96 % (02/01 0610) FiO2 (%):  [97 %] 97 % (01/31 1345) Weight:  [77.111 kg (170 lb)] 77.111 kg (170 lb) (01/31 1345)  Intake/Output from previous day:  Intake/Output Summary (Last 24 hours) at 05/01/11 0748 Last data filed at 05/01/11 0547  Gross per 24 hour  Intake   3790 ml  Output   3160 ml  Net    630 ml    Intake/Output this shift:    Labs: Results for orders placed during the hospital encounter of 04/30/11  CBC      Component Value Range   WBC 12.7 (*) 4.0 - 10.5 (K/uL)   RBC 3.45 (*) 3.87 - 5.11 (MIL/uL)   Hemoglobin 11.3 (*) 12.0 - 15.0 (g/dL)   HCT 62.1 (*) 30.8 - 46.0 (%)   MCV 96.8  78.0 - 100.0 (fL)   MCH 32.8  26.0 - 34.0 (pg)   MCHC 33.8  30.0 - 36.0 (g/dL)   RDW 65.7  84.6 - 96.2 (%)   Platelets 314  150 - 400 (K/uL)  BASIC METABOLIC PANEL      Component Value Range   Sodium 128 (*) 135 - 145 (mEq/L)   Potassium 3.6  3.5 - 5.1 (mEq/L)   Chloride 94 (*) 96 - 112 (mEq/L)   CO2 26  19 - 32 (mEq/L)   Glucose, Bld 134 (*) 70 - 99 (mg/dL)   BUN 9  6 - 23 (mg/dL)   Creatinine, Ser 9.52  0.50 - 1.10 (mg/dL)   Calcium 8.6  8.4 - 84.1 (mg/dL)   GFR calc non Af Amer 77 (*) >90 (mL/min)   GFR calc Af Amer 89 (*) >90 (mL/min)    Exam - Neurologically intact Neurovascular intact Sensation intact distally Dorsiflexion/Plantar flexion intact Incision: dressing C/D/I Compartment soft Dressing - clean, dry Motor function intact - moving foot and toes well on  exam.  Hemovac pulled without difficulty.  Assessment/Plan: 1 Day Post-Op Procedure(s) (LRB): TOTAL KNEE ARTHROPLASTY (Right)  Advance diet Up with therapy Continue foley due to anxiety with getting out of bed until after first PT visit Past Medical History  Diagnosis Date  . Vitamin B 12 deficiency   . Hypercholesterolemia   . Shortness of breath     with asthma  . Pneumonia   . GERD (gastroesophageal reflux disease)   . H/O hiatal hernia   . Arthritis   . Anxiety   . Depression     states well controlled  . Hypertension     clearance note with OV Dr Lorin Picket, EKG on chart 03/09/11  . Asthma     PFT 12/12  PCP on chart    DVT Prophylaxis - Xarelto Protocol Weight-Bearing as tolerated to right leg No vaccines. Hyponatremia will monitor - decrease IV fluids, fluid restrictions Repeat Na in am Pain control issues secondary to anxiety may need to adjust medications accordingly.    Chloe Gray R. 05/01/2011, 7:48 AM

## 2011-05-01 NOTE — Progress Notes (Signed)
Physical Therapy Treatment Patient Details Name: Chloe Gray MRN: [id number] DOB: April 02, 1950 Today's Date: 05/01/2011  [id number] 2TE  PT Assessment/Plan  PT - Assessment/Plan Comments on Treatment Session: Pt continues to have increased pain with activity.  Exercises required increased assist from therapist in order to complete with cues for pursed lip breathing to control pain.  Issued handout with exercises.  PT Plan: Discharge plan remains appropriate PT Frequency: 7X/week Recommendations for Other Services: OT consult Follow Up Recommendations: Home health PT Equipment Recommended: None recommended by PT PT Goals  Acute Rehab PT Goals PT Goal Formulation: With patient Time For Goal Achievement: 7 days Pt will go Supine/Side to Sit: with supervision PT Goal: Supine/Side to Sit - Progress: Goal set today Pt will go Sit to Supine/Side: with supervision PT Goal: Sit to Supine/Side - Progress: Goal set today Pt will go Sit to Stand: with supervision PT Goal: Sit to Stand - Progress: Goal set today Pt will go Stand to Sit: with supervision PT Goal: Stand to Sit - Progress: Goal set today Pt will Ambulate: 51 - 150 feet;with supervision;with least restrictive assistive device PT Goal: Ambulate - Progress: Goal set today Pt will Go Up / Down Stairs: 3-5 stairs;with min assist;with least restrictive assistive device PT Goal: Up/Down Stairs - Progress: Goal set today Pt will Perform Home Exercise Program: with supervision, verbal cues required/provided PT Goal: Perform Home Exercise Program - Progress: Progressing toward goal  PT Treatment Precautions/Restrictions  Precautions Precautions: Knee Required Braces or Orthoses: Yes Knee Immobilizer: Discontinue once straight leg raise with < 10 degree lag Restrictions Weight Bearing Restrictions: Yes RUE Weight Bearing: Weight bearing as tolerated Mobility (including Balance)   Exercise  Total Joint Exercises Ankle Circles/Pumps:  AROM;Both;20 reps;Supine Quad Sets: AROM;Right;10 reps;Supine Short Arc Quad: AAROM;Right;10 reps;Supine Heel Slides: AAROM;Right;10 reps;Supine Hip ABduction/ADduction: AAROM;Right;10 reps;Supine Straight Leg Raises: AAROM;Right;10 reps;Supine End of Session PT - End of Session Equipment Utilized During Treatment: Gait belt;Right knee immobilizer Activity Tolerance: Patient limited by pain Patient left: in bed;with call bell in reach;with family/visitor present Nurse Communication: Mobility status for transfers;Mobility status for ambulation General Behavior During Session: [facility] for tasks performed Cognition: [facility] for tasks performed  Page, Meribeth Mattes 05/01/2011, 12:45 PM

## 2011-05-02 LAB — CBC
HCT: 29.9 % — ABNORMAL LOW (ref 36.0–46.0)
Hemoglobin: 10.1 g/dL — ABNORMAL LOW (ref 12.0–15.0)
MCV: 94.9 fL (ref 78.0–100.0)
Platelets: 321 10*3/uL (ref 150–400)
RBC: 3.15 MIL/uL — ABNORMAL LOW (ref 3.87–5.11)
RDW: 12.2 % (ref 11.5–15.5)
WBC: 16.4 10*3/uL — ABNORMAL HIGH (ref 4.0–10.5)

## 2011-05-02 LAB — BASIC METABOLIC PANEL
Calcium: 8.6 mg/dL (ref 8.4–10.5)
GFR calc Af Amer: >90 mL/min (ref >90)
GFR calc non Af Amer: >90 mL/min (ref >90)
Glucose, Bld: 152 mg/dL — ABNORMAL HIGH (ref 70–99)
Potassium: 3.4 mEq/L — ABNORMAL LOW (ref 3.5–5.1)
Sodium: 124 mEq/L — ABNORMAL LOW (ref 135–145)

## 2011-05-02 MED ORDER — OXYCODONE HCL 5 MG PO TABS
5.0000 mg | ORAL_TABLET | ORAL | Status: DC | PRN
  Administered 2011-05-02 – 2011-05-03 (×4): 10 mg via ORAL
  Filled 2011-05-02 (×4): qty 2

## 2011-05-02 NOTE — Progress Notes (Signed)
Physical Therapy Treatment Patient Details Name: Caryn Gienger MRN: [id number] DOB: [birth year] Today's Date: 05/02/2011  [id number] 2G  PT Assessment/Plan  PT - Assessment/Plan Comments on Treatment Session: Pt continues to have increased pain and anxiety with ambulation/mobility.  O2 sats at 90%  on EOB with cues for pursed lip breathing throughout session.  O2 sats increased to 93% on room air.  Pt to D/C tomorrow.  PT Plan: Discharge plan remains appropriate PT Frequency: 7X/week Recommendations for Other Services: OT consult Follow Up Recommendations: Home health PT Equipment Recommended: None recommended by PT PT Goals  Acute Rehab PT Goals PT Goal Formulation: With patient Time For Goal Achievement: 7 days Pt will go Supine/Side to Sit: with supervision PT Goal: Supine/Side to Sit - Progress: Progressing toward goal Pt will go Sit to Stand: with supervision PT Goal: Sit to Stand - Progress: Progressing toward goal Pt will go Stand to Sit: with supervision PT Goal: Stand to Sit - Progress: Progressing toward goal Pt will Ambulate: 51 - 150 feet;with supervision;with least restrictive assistive device PT Goal: Ambulate - Progress: Progressing toward goal  PT Treatment Precautions/Restrictions  Precautions Precautions: Knee Required Braces or Orthoses: Yes Knee Immobilizer: Discontinue once straight leg raise with < 10 degree lag Restrictions Weight Bearing Restrictions: Yes RUE Weight Bearing: Weight bearing as tolerated Mobility (including Balance) Bed Mobility Bed Mobility: Yes Supine to Sit: 4: Min assist;HOB elevated (Comment degrees) Supine to Sit Details (indicate cue type and reason): Requires assist for R LE with cues for hand placement to assist trunk into sitting.  Transfers Transfers: Yes Sit to Stand: 4: Min assist;From elevated surface;With upper extremity assist;From bed Sit to Stand Details (indicate cue type and reason): +2 assist for safety due to  dizziness.  Cues for hand placement and LE management.  Stand to Sit: 4: Min assist;3: Mod assist;With upper extremity assist;With armrests;To chair/3-in-1 Stand to Sit Details: +2 assist for safety.  Assist for R LE and controlled descent with cues for hand placement and LE management  Ambulation/Gait Ambulation/Gait: Yes Ambulation/Gait Assistance: 4: Min assist;3: Mod assist Ambulation/Gait Assistance Details (indicate cue type and reason): +2 for safety due to slight lightheadedness.  Cues given for sequencing/technique and for pursed lip breathing to control pain/anxiety.  Ambulation Distance (Feet): 40 Feet Assistive device: Rolling walker Gait Pattern: Step-to pattern;Decreased stride length Gait velocity: decreased    Exercise    End of Session PT - End of Session Equipment Utilized During Treatment: Gait belt;Right knee immobilizer Activity Tolerance: Patient limited by pain Patient left: in chair;with call bell in reach;with family/visitor present General Behavior During Session: [facility] for tasks performed Cognition: [facility] for tasks performed  Page, Meribeth Mattes 05/02/2011, 10:54 AM

## 2011-05-02 NOTE — Progress Notes (Signed)
Physical Therapy Treatment Patient Details Name: Chloe Gray MRN: [id number] DOB: September 05, 1950 Today's Date: 05/02/2011  [id number] TA  PT Assessment/Plan  PT - Assessment/Plan Comments on Treatment Session: Pt with increased amounts of pain during pm session.  Pt refused ambulation and exercises, however was assisted back to bed from chair.  RN notified and meds given.  Husband states concern with pt not participating in therapy, however PT explained that it was best to defer therapy due to pain issues. Pt does perform mobility well, however pain is main limitation at this point.   PT Plan: Discharge plan remains appropriate PT Frequency: 7X/week Recommendations for Other Services: OT consult Follow Up Recommendations: Home health PT Equipment Recommended: None recommended by OT PT Goals  Acute Rehab PT Goals PT Goal Formulation: With patient Time For Goal Achievement: 7 days Pt will go Sit to Supine/Side: with supervision PT Goal: Sit to Supine/Side - Progress: Progressing toward goal Pt will go Sit to Stand: with supervision PT Goal: Sit to Stand - Progress: Progressing toward goal Pt will go Stand to Sit: with supervision PT Goal: Stand to Sit - Progress: Progressing toward goal  PT Treatment Precautions/Restrictions  Precautions Precautions: Knee Required Braces or Orthoses: Yes Knee Immobilizer: Discontinue once straight leg raise with < 10 degree lag Restrictions Weight Bearing Restrictions: No RUE Weight Bearing: Weight bearing as tolerated Mobility (including Balance) Bed Mobility Sit to Supine: 1: +2 Total assist;Patient percentage (comment) Sit to Supine - Details (indicate cue type and reason): Pt assist 60%.  Requires assist with B LE and min assist at trunk to get back into bed due to increased pain. Cues for hand placement to assist trunk.  Transfers Transfers: Yes Sit to Stand: 4: Min assist;With upper extremity assist;With armrests;From chair/3-in-1 Sit to  Stand Details (indicate cue type and reason): Requires cues for hand placement and LE management.  Stand to Sit: 4: Min assist;With upper extremity assist;To bed Stand to Sit Details: +2 for safety due to increased pain.  Cues for hand placement and LE management.  Stand Pivot Transfers: 4: Min assist Stand Pivot Transfer Details (indicate cue type and reason): Performed stand pivot only from chair to bed due to increased pain.  Cues for sequencing/technique with RW Ambulation/Gait Ambulation/Gait: No Assistive device: Rolling walker Gait Pattern: Step-to pattern;Decreased stride length    Exercise    End of Session PT - End of Session Equipment Utilized During Treatment: Right knee immobilizer Activity Tolerance: Patient limited by pain Patient left: in bed;with call bell in reach;with family/visitor present Nurse Communication: Other (comment) (RN notified of pain) General Behavior During Session: [facility] for tasks performed Cognition: [facility] for tasks performed  Page, Meribeth Mattes 05/02/2011, 3:16 PM

## 2011-05-02 NOTE — Evaluation (Signed)
Occupational Therapy Evaluation Patient Details Name: Chloe Gray MRN: [id number] DOB: 01/02/1951 Today's Date: 05/02/2011 11:05-11:28  Problem List:  Patient Active Problem List  Diagnoses  . Osteoarthritis of right knee    Past Medical History:  Past Medical History  Diagnosis Date  . Vitamin B 12 deficiency   . Hypercholesterolemia   . Shortness of breath     with asthma  . Pneumonia   . GERD (gastroesophageal reflux disease)   . H/O hiatal hernia   . Arthritis   . Anxiety   . Depression     states well controlled  . Hypertension     clearance note with OV Dr Lorin Picket, EKG on chart 03/09/11  . Asthma     PFT 12/12  PCP on chart   Past Surgical History:  Past Surgical History  Procedure Date  . Umbilical hernia repair   . Tubal ligation   . Colonoscopy   . Knee arthroscopy   . Total knee arthroplasty 04/30/2011    Procedure: TOTAL KNEE ARTHROPLASTY;  Surgeon: Javier Docker, MD;  Location: [facility];  Service: Orthopedics;  Laterality: Right;  Femoral nerve block    OT Assessment/Plan/Recommendation OT Assessment Clinical Impression Statement: This 61 yo female s/p RTKR presents to acute OT with all education now completed with pt and husband. No further OT needs, will D/C from acute OT. Pt's daughter is an OT so she can A prn in the evenings. OT Recommendation/Assessment: Patient does not need any further OT services OT Recommendation Follow Up Recommendations: No OT follow up Equipment Recommended: None recommended by OT OT Goals    OT Evaluation Precautions/Restrictions  Precautions Precautions: Knee Required Braces or Orthoses: Yes Knee Immobilizer: Discontinue once straight leg raise with < 10 degree lag Restrictions Weight Bearing Restrictions: No RUE Weight Bearing: Weight bearing as tolerated Prior Functioning Home Living Lives With: Spouse Receives Help From: Family;Other (Comment) (pt's daughter is an OT in [place]) Type of Home: House Home  Layout: One level Home Access: Stairs to enter Entrance Stairs-Rails: Left Entrance Stairs-Number of Steps: 3 Bathroom Shower/Tub: Walk-in Contractor: Standard Bathroom Accessibility: Yes How Accessible: Accessible via walker Home Adaptive Equipment: Bedside commode/3-in-1;Walker - rolling;Grab bars in shower;Hand-held shower hose;Shower chair with back;Other (comment) (one bar on inside wall) Prior Function Level of Independence: Independent with basic ADLs;Independent with homemaking with ambulation;Independent with gait;Independent with transfers Driving: Yes ADL ADL ADL Comments: Went over with pt and pt's husband observing how to get into/out of the shower stall with RW to/from 3-n-1 two ways backing into with LLE first and out last as well as going in sideways (more of a process for this), also showed husband (who has also had knee surgery 1 1/2 years ago) how he could  steady the RW for  pt to be able to use it for sit to stand from a shower seat if she wanted to do this instead. Asked if they had any questions about any BADLs and they said no. Did make them aware  that with dressing  the RLE goes in the clothes first and comes out last.  Made husband aware how the KI should fit on the pt. Vision/Perception    Cognition Cognition Arousal/Alertness: Awake/alert Overall Cognitive Status: Appears within functional limits for tasks assessed Orientation Level: Oriented X4 Sensation/Coordination   Extremity Assessment   Mobility  Exercises   End of Session General Behavior During Session: [facility] for tasks performed Cognition: [facility] for tasks performed   Evette Georges  [phone] 05/02/2011, 11:43 AM

## 2011-05-02 NOTE — Progress Notes (Signed)
Subjective: Patient bedside chair sitting up comfortable-appearing patient has no other complaints   Objective: Vital signs in last 24 hours: Temp:  [98.5 F (36.9 C)-99.4 F (37.4 C)] 99.4 F (37.4 C) (02/02 0544) Pulse Rate:  [104-109] 104  (02/02 0544) Resp:  [16] 16  (02/02 0544) BP: (132-137)/(82) 137/82 mmHg (02/02 0544) SpO2:  [93 %-96 %] 93 % (02/02 0544)  Intake/Output from previous day: 02/01 0701 - 02/02 0700 In: 3111.7 [P.O.:1200; I.V.:1911.7] Out: 2475 [Urine:2475] Intake/Output this shift:     Basename 05/02/11 0348 05/01/11 0410  HGB 10.1* 11.3*    Basename 05/02/11 0348 05/01/11 0410  WBC 16.4* 12.7*  RBC 3.15* 3.45*  HCT 29.9* 33.4*  PLT 321 314    Basename 05/02/11 0348 05/01/11 0410  NA 124* 128*  K 3.4* 3.6  CL 90* 94*  CO2 25 26  BUN 8 9  CREATININE 0.75 0.82  GLUCOSE 152* 134*  CALCIUM 8.6 8.6   No results found for this basename: LABPT:2,INR:2 in the last 72 hours  ppatient is conscious alert appropriate  sitting in a bedside chair.  Patient appears to be in no distress. Right lower extremity dressing taken down no signs of any pressure blisters room drainage  or erythema.  We'll well approximated with staples.  Calf and thigh are soft and nontender.  Foot is no neuromotor vascularly intact.  Assessment/Plan: A #2 status post right total knee arthroplasty doing well Hyponatremia currently asymptomatic IV is running 100 and hydrochlorothiazide. Will DC IV fluids free water restrictions and DC hydrochlorothiazide we'll recheck sodium levels tomorrow A chest pain medicines to when necessary schedule Continue total knee protocol physical therapy and then CPM   Classie Weng W 05/02/2011, 7:44 AM

## 2011-05-03 LAB — BASIC METABOLIC PANEL
CO2: 27 mEq/L (ref 19–32)
Calcium: 8.8 mg/dL (ref 8.4–10.5)
GFR calc non Af Amer: 73 mL/min — ABNORMAL LOW (ref >90)
Glucose, Bld: 136 mg/dL — ABNORMAL HIGH (ref 70–99)
Potassium: 3.5 mEq/L (ref 3.5–5.1)
Sodium: 129 mEq/L — ABNORMAL LOW (ref 135–145)

## 2011-05-03 LAB — CBC
Hemoglobin: 9.3 g/dL — ABNORMAL LOW (ref 12.0–15.0)
MCH: 32.4 pg (ref 26.0–34.0)
Platelets: 317 10*3/uL (ref 150–400)
RBC: 2.87 MIL/uL — ABNORMAL LOW (ref 3.87–5.11)

## 2011-05-03 NOTE — Progress Notes (Signed)
Physical Therapy Treatment Patient Details Name: Chloe Gray MRN: [id number] DOB: 01-May-1950 Today's Date: 05/03/2011  [phone] 2G  PT Assessment/Plan  PT - Assessment/Plan Comments on Treatment Session: Pt continues to have increased pain.  Also noted that pt is more lethargic during session.  Spoke with RN about lethargy, RN stated to trial ambulation while wearing O2.  Will ambulate in pm w/ O2 and monitor sats.   PT Plan: Discharge plan remains appropriate PT Frequency: 7X/week Follow Up Recommendations: Home health PT Equipment Recommended: Other (comment) (May need O2 for  home use) PT Goals  Acute Rehab PT Goals PT Goal Formulation: With patient Time For Goal Achievement: 7 days Pt will go Supine/Side to Sit: with supervision PT Goal: Supine/Side to Sit - Progress: Progressing toward goal Pt will go Sit to Supine/Side: with supervision PT Goal: Sit to Supine/Side - Progress: Progressing toward goal Pt will go Sit to Stand: with supervision PT Goal: Sit to Stand - Progress: Progressing toward goal Pt will go Stand to Sit: with supervision PT Goal: Stand to Sit - Progress: Progressing toward goal Pt will Ambulate: 51 - 150 feet;with supervision;with least restrictive assistive device PT Goal: Ambulate - Progress: Progressing toward goal Pt will Go Up / Down Stairs: 3-5 stairs;with min assist;with least restrictive assistive device PT Goal: Up/Down Stairs - Progress: Met  PT Treatment Precautions/Restrictions  Precautions Precautions: Knee Required Braces or Orthoses: Yes Knee Immobilizer: Discontinue once straight leg raise with < 10 degree lag Restrictions Weight Bearing Restrictions: No RUE Weight Bearing: Weight bearing as tolerated Mobility (including Balance) Bed Mobility Bed Mobility: Yes Supine to Sit: 4: Min assist Supine to Sit Details (indicate cue type and reason): Requires assist with RLE and cues for hand placement. Sit to Supine: 4: Min assist Sit to  Supine - Details (indicate cue type and reason): Requires assist with RLE with cues to bend LLE for hip adjustment in bed Transfers Transfers: Yes Sit to Stand: 4: Min assist;With upper extremity assist;From bed Sit to Stand Details (indicate cue type and reason): Min/guard for safety with cues for hand placement and RLE management Stand to Sit: 4: Min assist;With upper extremity assist;To bed Stand to Sit Details: Requires cues for hand placement, keeping RW in place until ready to sit and LE managemetn.  Ambulation/Gait Ambulation/Gait: Yes Ambulation/Gait Assistance: 4: Min assist Ambulation/Gait Assistance Details (indicate cue type and reason): Requires verbal and tactile cuing for sequencing/placement of RW, cues for pursed lip breathing.  Ambulation Distance (Feet): 40 Feet (then another 40') Assistive device: Rolling walker Gait Pattern: Step-to pattern;Decreased stride length Gait velocity: decreased Stairs: Yes Stairs Assistance: 4: Min assist Stairs Assistance Details (indicate cue type and reason): Requires cues for sequencing/technique and to continue pursed lip breathing.  Stair Management Technique: No rails;Backwards;Forwards;With walker;Step to pattern Number of Stairs: 4  Height of Stairs: 6     Exercise    End of Session PT - End of Session Equipment Utilized During Treatment: Right knee immobilizer Activity Tolerance: Patient limited by pain Patient left: in bed;with call bell in reach;with family/visitor present Nurse Communication: Other (comment) (Communicated w/ RN about pt lethargic, RN states to amb w/O2) General Behavior During Session: Lethargic Cognition: [facility] for tasks performed  Page, Meribeth Mattes 05/03/2011, 10:03 AM

## 2011-05-03 NOTE — Progress Notes (Addendum)
CARE MANAGEMENT NOTE 05/03/2011  Patient:  Chloe Gray, Chloe Gray   Account Number:  [id number]  Date Initiated:  05/01/2011  Documentation initiated by:  Colleen Can  Subjective/Objective Assessment:   DX: osteoarthritis rt knee:  TOTAL KNEE REPLACEMNT     Action/Plan:   CM SPOKE WITH PATIENT AND SPOUSE. zcurrent plans are for patient to return to her home in [place] where her spouse will be caregiver. Patient already has access to DME   Anticipated DC Date:  05/03/2011   Anticipated DC Plan:  HOME W HOME HEALTH SERVICES  In-house referral  NA      DC Planning Services  CM consult      [facility] Choice  HOME HEALTH   Choice offered to / List presented to:  C-3 Spouse   DME arranged  NA      DME agency  NA     HH arranged  HH-2 PT  HH-3 OT  HH-4 NURSE'S AIDE      HH agency  [facility]   Status of service:  Completed, signed off Medicare Important Message given?   (If response is "NO", the following Medicare IM given date fields will be blank) Date Medicare IM given:   Date Additional Medicare IM given:    Discharge Disposition:    Per UR Regulation:    Comments: 05/03/11 Roxy Manns Shaquoya Cosper,RN,BSN [phone] Patient discharged 05/03/11. Per pt choice [facility] contacted concerning pt discharge. CM spoke with On-call rep Tomma Lightning contacted via hospital operator at [phone]. Per Tomma Lightning, pt contact concerning start of care will occur 05/04/11. CM left voicemail with clinical agency voicemail at [phone]. Pt discharged home without oxygen per MD. Pt agreed with plan of care.  05/03/11 0949 Leonie Green [phone] CM spoke with pt with husband at bedside concerning d/c planning. Per pt choice [facility] to provide [facility] services. Cm faxed documentation and MD orders to [facility] at [phone]. Pt may need home oxygen therapy. Currenlty on 2l 02 n/c. Per pt choice [facility] to provide home oxygen euipment if  needed for home use upon discharge. Spouse to assist in home care. [facility] rep Talmadge Coventry notified of referral.    05/01/2011 Raynelle Bring BSN CCM [phone] Spouse wants to review list of agencies with his daughter before making a decision. Wll advise wekend case manager to follow up for [facility] choice. List of agencies placed in shadow chart.

## 2011-05-03 NOTE — Progress Notes (Signed)
Chloe Gray  MRN: [id number] DOB/Age: 60/14/1952 60 y.o. Physician: Jacquelyne Balint Procedure: Procedure(s) (LRB): TOTAL KNEE ARTHROPLASTY (Right)     Subjective: Pt eating and comfortable. Ready to go home.   Vital Signs Temp:  [97.2 F (36.2 C)-99.3 F (37.4 C)] 97.2 F (36.2 C) (02/03 0530) Pulse Rate:  [100-104] 104  (02/03 0530) Resp:  [15-16] 16  (02/03 0530) BP: (114-121)/(69-73) 114/72 mmHg (02/03 0530) SpO2:  [85 %-93 %] 91 % (02/03 0530)  Lab Results  Basename 05/03/11 0425 05/02/11 0348  WBC 13.6* 16.4*  HGB 9.3* 10.1*  HCT 27.1* 29.9*  PLT 317 321   BMET  Basename 05/03/11 0425 05/02/11 0348  NA 129* 124*  K 3.5 3.4*  CL 93* 90*  CO2 27 25  GLUCOSE 136* 152*  BUN 10 8  CREATININE 0.86 0.75  CALCIUM 8.8 8.6   INR  Date Value Range Status  04/27/2011 0.93  0.00-1.49 (no units) Final     Exam NVI to operative extremity        Plan S/p R TKA  DC home today  [facility] for Dr.Kevin Supple 05/03/2011, 9:21 AM

## 2011-05-03 NOTE — Progress Notes (Signed)
Discharged from floor via w/c, family with pt. No changes in assessment. Chloe Gray  

## 2011-05-03 NOTE — Progress Notes (Signed)
CARE MANAGEMENT NOTE 05/03/2011  Patient:  MERRY, POND   Account Number:  [id number]  Date Initiated:  05/01/2011  Documentation initiated by:  Colleen Can  Subjective/Objective Assessment:   DX: osteoarthritis rt knee:  TOTAL KNEE REPLACEMNT     Action/Plan:   CM SPOKE WITH PATIENT AND SPOUSE. zcurrent plans are for patient to return to her home in [place] where her spouse will be caregiver. Patient already has access to DME   Anticipated DC Date:  05/03/2011   Anticipated DC Plan:  HOME W HOME HEALTH SERVICES  In-house referral  NA      DC Planning Services  CM consult      [facility] Choice  HOME HEALTH   Choice offered to / List presented to:  C-3 Spouse   DME arranged  NA      DME agency  NA     HH arranged  HH-2 PT  HH-3 OT  HH-4 NURSE'S AIDE      HH agency  [facility]   Status of service:  In process, will continue to follow Medicare Important Message given?   (If response is "NO", the following Medicare IM given date fields will be blank) Date Medicare IM given:   Date Additional Medicare IM given:    Discharge Disposition:    Per UR Regulation:    Comments:  05/03/11 0949 Leonie Green [phone] CM spoke with pt with husband at bedside concerning d/c planning. Per pt choice [facility] to provide [facility] services. Cm faxed documentation and MD orders to [facility] at [phone]. Pt may need home oxygen therapy. Currenlty on 2l 02 n/c. Per pt choice [facility] to provide home oxygen euipment if needed for home use upon discharge. Spouse to assist in home care. [facility] rep Talmadge Coventry notified of referral.

## 2011-05-04 NOTE — Discharge Summary (Signed)
Patient ID: Chloe Gray MRN: [id number] DOB/AGE: Oct 15, 1950 61 y.o.  Admit date: 04/30/2011 Discharge date: 05/04/2011  Admission Diagnoses:  Principal Problem:  *Osteoarthritis of right knee  Past Medical History  Diagnosis Date  . Vitamin B 12 deficiency   . Hypercholesterolemia   . Shortness of breath     with asthma  . Pneumonia   . GERD (gastroesophageal reflux disease)   . H/O hiatal hernia   . Arthritis   . Anxiety   . Depression     states well controlled  . Hypertension     clearance note with OV Dr Lorin Picket, EKG on chart 03/09/11  . Asthma     PFT 12/12  PCP on chart   Discharge Diagnoses:  Same s/p right TKA  Surgeries: Procedure(s): TOTAL KNEE ARTHROPLASTY on 04/30/2011   Consultants:  PT/OT  Discharged Condition: Improved  Hospital Course: Shalese Strahan is an 61 y.o. female who was admitted 04/30/2011 for operative treatment ofOsteoarthritis of right knee. Patient has severe unremitting pain that affects sleep, daily activities, and work/hobbies. After pre-op clearance the patient was taken to the operating room on 04/30/2011 and underwent  Procedure(s): TOTAL KNEE ARTHROPLASTY.    Patient was given perioperative antibiotics: Anti-infectives     Start     Dose/Rate Route Frequency Ordered Stop   04/30/11 1400   ceFAZolin (ANCEF) IVPB 2 g/50 mL premix        2 g 100 mL/hr over 30 Minutes Intravenous Every 6 hours 04/30/11 1332 05/01/11 0156   04/30/11 0816   polymyxin B 500,000 Units, bacitracin 50,000 Units in sodium chloride irrigation 0.9 % 500 mL irrigation  Status:  Discontinued          As needed 04/30/11 0817 04/30/11 0930   04/30/11 0515   ceFAZolin (ANCEF) IVPB 2 g/50 mL premix        2 g 100 mL/hr over 30 Minutes Intravenous 60 min pre-op 04/30/11 0509 04/30/11 0745           Patient was given sequential compression devices, early ambulation, and chemoprophylaxis to prevent DVT.  Patient benefited maximally from hospital stay and there were no  complications.  She did have asymptomatic hyponatremia but this improved prior to discharge.  Recent vital signs: No data found.    Recent laboratory studies:  Basename 05/03/11 0425 May 25, 2011 0348  WBC 13.6* 16.4*  HGB 9.3* 10.1*  HCT 27.1* 29.9*  PLT 317 321  NA 129* 124*  K 3.5 3.4*  CL 93* 90*  CO2 27 25  BUN 10 8  CREATININE 0.86 0.75  GLUCOSE 136* 152*  INR -- --  CALCIUM 8.8 --     Discharge Medications:   Medication List  As of 05/04/2011  8:17 AM   STOP taking these medications         diclofenac 75 MG EC tablet      ibuprofen 200 MG tablet         TAKE these medications         albuterol 108 (90 BASE) MCG/ACT inhaler   Commonly known as: PROVENTIL HFA;VENTOLIN HFA   Inhale 2 puffs into the lungs every 6 (six) hours as needed. For shortness of breath.      albuterol (2.5 MG/3ML) 0.083% nebulizer solution   Commonly known as: PROVENTIL   Take 2.5 mg by nebulization every 6 (six) hours as needed. For shortness of breath.      ALPRAZolam 1 MG tablet   Commonly known as: Prudy Feeler  Take 1 mg by mouth 3 (three) times daily with meals.      BIOTIN PO   Take 1 tablet by mouth daily.      buPROPion 300 MG 24 hr tablet   Commonly known as: WELLBUTRIN XL   Take 450 mg by mouth daily before breakfast.      buPROPion 300 MG 24 hr tablet   Commonly known as: WELLBUTRIN XL   Take 300 mg by mouth daily.      buPROPion 150 MG 24 hr tablet   Commonly known as: WELLBUTRIN XL   Take 150 mg by mouth daily.      cyanocobalamin 1000 MCG/ML injection   Commonly known as: (VITAMIN B-12)   Inject 100 mcg into the muscle every 30 (thirty) days.      DSS 100 MG Caps   Take 100 mg by mouth 2 (two) times daily.      Fluticasone-Salmeterol 250-50 MCG/DOSE Aepb   Commonly known as: ADVAIR   Inhale 1 puff into the lungs every 12 (twelve) hours.      gabapentin 400 MG capsule   Commonly known as: NEURONTIN   Take 400 mg by mouth 3 (three) times daily.       lisinopril-hydrochlorothiazide 20-12.5 MG per tablet   Commonly known as: PRINZIDE,ZESTORETIC   Take 1 tablet by mouth 2 (two) times daily.      omeprazole-sodium bicarbonate 40-1100 MG per capsule   Commonly known as: ZEGERID   Take 1 capsule by mouth 2 (two) times daily.      oxyCODONE 5 MG immediate release tablet   Commonly known as: Oxy IR/ROXICODONE   Take 1-2 tablets (5-10 mg total) by mouth every 3 (three) hours.      rivaroxaban 10 MG Tabs tablet   Commonly known as: XARELTO   Take 1 tablet (10 mg total) by mouth daily with breakfast.      venlafaxine 75 MG 24 hr capsule   Commonly known as: EFFEXOR-XR   Take 150-225 mg by mouth 2 (two) times daily. 3 capsules in the morning and 2 capsule at night.            Diagnostic Studies: Dg Chest 2 View  04/27/2011  *RADIOLOGY REPORT*  Clinical Data: Preop.  CHEST - 2 VIEW  Comparison: CT chest 09/05/2010 and chest radiograph 02/25/2006.  Findings: Trachea is midline.  Heart size normal.  Lungs are clear. No pleural fluid.  IMPRESSION: No acute findings.  Original Report Authenticated By: Reyes Ivan, M.D.   Dg Knee 1-2 Views Right  04/27/2011  *RADIOLOGY REPORT*  Clinical Data: Preoperative evaluation prior to arthroplasty.  RIGHT KNEE - 1-2 VIEW 04/27/2011:  Comparison: None.  Findings: Near complete loss of the lateral compartment joint space.  Severe patellofemoral compartment joint space narrowing and mild medial compartment joint space narrowing.  Associated hypertrophic spurring involving all compartments.  Moderately large joint effusion.  Well-preserved bone mineral density.  IMPRESSION: Tricompartment osteoarthritis, worst in the lateral and patellofemoral compartments.  Original Report Authenticated By: Arnell Sieving, M.D.   X-ray Knee Right Port  04/30/2011  *RADIOLOGY REPORT*  Clinical Data: Status post knee replacement.  PORTABLE RIGHT KNEE - 1-2 VIEW  Comparison: Plain films 04/27/2011.  Findings: The patient  has a new right total knee arthroplasty.  The device is located and no fracture is identified.  Gas in the soft tissues and surgical drain are noted.  Surgical staples are in place.  IMPRESSION: Right total knee replacement without  evidence of complication.  Original Report Authenticated By: Bernadene Bell. Maricela Curet, M.D.    Disposition: Home-Health Care Svc  Discharge Orders    Future Orders Please Complete By Expires   Diet - low sodium heart healthy      Call MD / Call 911      Comments:   If you experience chest pain or shortness of breath, CALL 911 and be transported to the hospital emergency room.  If you develope a fever above 101 F, pus (white drainage) or increased drainage or redness at the wound, or calf pain, call your surgeon's office.   Constipation Prevention      Comments:   Drink plenty of fluids.  Prune juice may be helpful.  You may use a stool softener, such as Colace (over the counter) 100 mg twice a day.  Use MiraLax (over the counter) for constipation as needed.   Increase activity slowly as tolerated      Discharge instructions      Comments:   Use knee immobilizer until can SLR x 10 Elevate above heart level at least 6 x a day for 20 minutes Daily dressing change OK to shower  No driving    TED hose      Comments:   Use stockings (TED hose) for 4 weeks on both leg(s).  You may remove them at night for sleeping.   Change dressing      Comments:   change the dressing daily with sterile 4 x 4 inch gauze dressing and apply TED hose.      Follow-up Information    Follow up with BEANE,JEFFREY C, MD in 12 days.   Contact information:   [facility] [address], [address] [place] [address] [phone]           Signed: Liam Graham. 05/04/2011, 8:17 AM

## 2013-09-26 DIAGNOSIS — Z1151 Encounter for screening for human papillomavirus (HPV): Secondary | ICD-10-CM | POA: Diagnosis not present

## 2013-09-26 DIAGNOSIS — Z1239 Encounter for other screening for malignant neoplasm of breast: Secondary | ICD-10-CM | POA: Diagnosis not present

## 2013-09-26 DIAGNOSIS — Z01419 Encounter for gynecological examination (general) (routine) without abnormal findings: Secondary | ICD-10-CM | POA: Diagnosis not present

## 2013-10-05 DIAGNOSIS — E785 Hyperlipidemia, unspecified: Secondary | ICD-10-CM | POA: Diagnosis not present

## 2013-10-05 DIAGNOSIS — R5381 Other malaise: Secondary | ICD-10-CM | POA: Diagnosis not present

## 2013-10-09 DIAGNOSIS — F3342 Major depressive disorder, recurrent, in full remission: Secondary | ICD-10-CM | POA: Diagnosis not present

## 2013-10-10 DIAGNOSIS — Z1231 Encounter for screening mammogram for malignant neoplasm of breast: Secondary | ICD-10-CM | POA: Diagnosis not present

## 2013-10-17 DIAGNOSIS — I10 Essential (primary) hypertension: Secondary | ICD-10-CM | POA: Diagnosis not present

## 2013-10-17 DIAGNOSIS — F341 Dysthymic disorder: Secondary | ICD-10-CM | POA: Diagnosis not present

## 2013-10-17 DIAGNOSIS — Z6831 Body mass index (BMI) 31.0-31.9, adult: Secondary | ICD-10-CM | POA: Diagnosis not present

## 2013-10-17 DIAGNOSIS — R7301 Impaired fasting glucose: Secondary | ICD-10-CM | POA: Diagnosis not present

## 2013-10-17 DIAGNOSIS — E785 Hyperlipidemia, unspecified: Secondary | ICD-10-CM | POA: Diagnosis not present

## 2014-03-20 DIAGNOSIS — F3341 Major depressive disorder, recurrent, in partial remission: Secondary | ICD-10-CM | POA: Diagnosis not present

## 2014-04-19 DIAGNOSIS — I10 Essential (primary) hypertension: Secondary | ICD-10-CM | POA: Diagnosis not present

## 2014-04-19 DIAGNOSIS — E785 Hyperlipidemia, unspecified: Secondary | ICD-10-CM | POA: Diagnosis not present

## 2014-08-27 DIAGNOSIS — E785 Hyperlipidemia, unspecified: Secondary | ICD-10-CM | POA: Diagnosis not present

## 2014-08-27 DIAGNOSIS — R4789 Other speech disturbances: Secondary | ICD-10-CM | POA: Diagnosis present

## 2014-08-27 DIAGNOSIS — R4182 Altered mental status, unspecified: Secondary | ICD-10-CM | POA: Diagnosis not present

## 2014-08-27 DIAGNOSIS — R079 Chest pain, unspecified: Secondary | ICD-10-CM | POA: Diagnosis not present

## 2014-08-27 DIAGNOSIS — Z91040 Latex allergy status: Secondary | ICD-10-CM | POA: Diagnosis not present

## 2014-08-27 DIAGNOSIS — I10 Essential (primary) hypertension: Secondary | ICD-10-CM | POA: Diagnosis present

## 2014-08-27 DIAGNOSIS — F418 Other specified anxiety disorders: Secondary | ICD-10-CM | POA: Diagnosis present

## 2014-08-27 DIAGNOSIS — I63412 Cerebral infarction due to embolism of left middle cerebral artery: Secondary | ICD-10-CM | POA: Diagnosis present

## 2014-08-27 DIAGNOSIS — E042 Nontoxic multinodular goiter: Secondary | ICD-10-CM | POA: Diagnosis present

## 2014-08-27 DIAGNOSIS — E780 Pure hypercholesterolemia: Secondary | ICD-10-CM | POA: Diagnosis present

## 2014-08-27 DIAGNOSIS — Z7951 Long term (current) use of inhaled steroids: Secondary | ICD-10-CM | POA: Diagnosis not present

## 2014-08-27 DIAGNOSIS — R0602 Shortness of breath: Secondary | ICD-10-CM | POA: Diagnosis not present

## 2014-08-27 DIAGNOSIS — M1990 Unspecified osteoarthritis, unspecified site: Secondary | ICD-10-CM | POA: Diagnosis present

## 2014-08-27 DIAGNOSIS — R2689 Other abnormalities of gait and mobility: Secondary | ICD-10-CM | POA: Diagnosis present

## 2014-08-27 DIAGNOSIS — Z96659 Presence of unspecified artificial knee joint: Secondary | ICD-10-CM | POA: Diagnosis present

## 2014-08-27 DIAGNOSIS — Z7982 Long term (current) use of aspirin: Secondary | ICD-10-CM | POA: Diagnosis not present

## 2014-08-27 DIAGNOSIS — R42 Dizziness and giddiness: Secondary | ICD-10-CM | POA: Diagnosis not present

## 2014-08-27 DIAGNOSIS — I639 Cerebral infarction, unspecified: Secondary | ICD-10-CM | POA: Diagnosis not present

## 2014-08-27 DIAGNOSIS — K219 Gastro-esophageal reflux disease without esophagitis: Secondary | ICD-10-CM | POA: Diagnosis present

## 2014-08-27 DIAGNOSIS — I5032 Chronic diastolic (congestive) heart failure: Secondary | ICD-10-CM | POA: Diagnosis present

## 2014-09-05 DIAGNOSIS — E041 Nontoxic single thyroid nodule: Secondary | ICD-10-CM | POA: Diagnosis not present

## 2014-09-05 DIAGNOSIS — Z6830 Body mass index (BMI) 30.0-30.9, adult: Secondary | ICD-10-CM | POA: Diagnosis not present

## 2014-09-05 DIAGNOSIS — I69328 Other speech and language deficits following cerebral infarction: Secondary | ICD-10-CM | POA: Diagnosis not present

## 2014-09-05 DIAGNOSIS — E871 Hypo-osmolality and hyponatremia: Secondary | ICD-10-CM | POA: Diagnosis not present

## 2014-09-11 DIAGNOSIS — I639 Cerebral infarction, unspecified: Secondary | ICD-10-CM | POA: Diagnosis not present

## 2014-09-11 DIAGNOSIS — E042 Nontoxic multinodular goiter: Secondary | ICD-10-CM | POA: Diagnosis not present

## 2014-09-18 DIAGNOSIS — F3341 Major depressive disorder, recurrent, in partial remission: Secondary | ICD-10-CM | POA: Diagnosis not present

## 2014-09-20 DIAGNOSIS — E871 Hypo-osmolality and hyponatremia: Secondary | ICD-10-CM | POA: Diagnosis not present

## 2014-09-20 DIAGNOSIS — I10 Essential (primary) hypertension: Secondary | ICD-10-CM | POA: Diagnosis not present

## 2014-10-16 DIAGNOSIS — E785 Hyperlipidemia, unspecified: Secondary | ICD-10-CM | POA: Diagnosis not present

## 2014-10-16 DIAGNOSIS — I10 Essential (primary) hypertension: Secondary | ICD-10-CM | POA: Diagnosis not present

## 2014-10-16 DIAGNOSIS — Z6830 Body mass index (BMI) 30.0-30.9, adult: Secondary | ICD-10-CM | POA: Diagnosis not present

## 2014-10-16 DIAGNOSIS — E871 Hypo-osmolality and hyponatremia: Secondary | ICD-10-CM | POA: Diagnosis not present

## 2014-10-26 DIAGNOSIS — M8589 Other specified disorders of bone density and structure, multiple sites: Secondary | ICD-10-CM | POA: Diagnosis not present

## 2014-10-26 DIAGNOSIS — Z1231 Encounter for screening mammogram for malignant neoplasm of breast: Secondary | ICD-10-CM | POA: Diagnosis not present

## 2014-12-17 DIAGNOSIS — F330 Major depressive disorder, recurrent, mild: Secondary | ICD-10-CM | POA: Diagnosis not present

## 2015-01-02 DIAGNOSIS — I639 Cerebral infarction, unspecified: Secondary | ICD-10-CM | POA: Diagnosis not present

## 2015-01-23 DIAGNOSIS — Z23 Encounter for immunization: Secondary | ICD-10-CM | POA: Diagnosis not present

## 2015-04-02 DIAGNOSIS — F330 Major depressive disorder, recurrent, mild: Secondary | ICD-10-CM | POA: Diagnosis not present

## 2015-04-03 DIAGNOSIS — J0100 Acute maxillary sinusitis, unspecified: Secondary | ICD-10-CM | POA: Diagnosis not present

## 2015-05-14 DIAGNOSIS — L814 Other melanin hyperpigmentation: Secondary | ICD-10-CM | POA: Diagnosis not present

## 2015-05-14 DIAGNOSIS — L820 Inflamed seborrheic keratosis: Secondary | ICD-10-CM | POA: Diagnosis not present

## 2015-05-14 DIAGNOSIS — L578 Other skin changes due to chronic exposure to nonionizing radiation: Secondary | ICD-10-CM | POA: Diagnosis not present

## 2015-05-14 DIAGNOSIS — L853 Xerosis cutis: Secondary | ICD-10-CM | POA: Diagnosis not present

## 2015-05-15 DIAGNOSIS — E785 Hyperlipidemia, unspecified: Secondary | ICD-10-CM | POA: Diagnosis not present

## 2015-05-15 DIAGNOSIS — I10 Essential (primary) hypertension: Secondary | ICD-10-CM | POA: Diagnosis not present

## 2015-05-15 DIAGNOSIS — R7301 Impaired fasting glucose: Secondary | ICD-10-CM | POA: Diagnosis not present

## 2015-05-15 DIAGNOSIS — Z8673 Personal history of transient ischemic attack (TIA), and cerebral infarction without residual deficits: Secondary | ICD-10-CM | POA: Diagnosis not present

## 2015-05-15 DIAGNOSIS — E871 Hypo-osmolality and hyponatremia: Secondary | ICD-10-CM | POA: Diagnosis not present

## 2015-05-15 DIAGNOSIS — E538 Deficiency of other specified B group vitamins: Secondary | ICD-10-CM | POA: Diagnosis not present

## 2015-05-15 DIAGNOSIS — F341 Dysthymic disorder: Secondary | ICD-10-CM | POA: Diagnosis not present

## 2015-05-15 DIAGNOSIS — Z6828 Body mass index (BMI) 28.0-28.9, adult: Secondary | ICD-10-CM | POA: Diagnosis not present

## 2015-06-11 DIAGNOSIS — F330 Major depressive disorder, recurrent, mild: Secondary | ICD-10-CM | POA: Diagnosis not present

## 2015-06-27 DIAGNOSIS — R799 Abnormal finding of blood chemistry, unspecified: Secondary | ICD-10-CM | POA: Diagnosis not present

## 2015-09-23 DIAGNOSIS — F330 Major depressive disorder, recurrent, mild: Secondary | ICD-10-CM | POA: Diagnosis not present

## 2015-10-22 DIAGNOSIS — Z01419 Encounter for gynecological examination (general) (routine) without abnormal findings: Secondary | ICD-10-CM | POA: Diagnosis not present

## 2015-10-22 DIAGNOSIS — Z1159 Encounter for screening for other viral diseases: Secondary | ICD-10-CM | POA: Diagnosis not present

## 2015-10-22 DIAGNOSIS — Z1239 Encounter for other screening for malignant neoplasm of breast: Secondary | ICD-10-CM | POA: Diagnosis not present

## 2015-11-11 DIAGNOSIS — F341 Dysthymic disorder: Secondary | ICD-10-CM | POA: Diagnosis not present

## 2015-11-11 DIAGNOSIS — J4530 Mild persistent asthma, uncomplicated: Secondary | ICD-10-CM | POA: Diagnosis not present

## 2015-11-11 DIAGNOSIS — E041 Nontoxic single thyroid nodule: Secondary | ICD-10-CM | POA: Diagnosis not present

## 2015-11-11 DIAGNOSIS — E538 Deficiency of other specified B group vitamins: Secondary | ICD-10-CM | POA: Diagnosis not present

## 2015-11-11 DIAGNOSIS — I10 Essential (primary) hypertension: Secondary | ICD-10-CM | POA: Diagnosis not present

## 2015-11-11 DIAGNOSIS — E871 Hypo-osmolality and hyponatremia: Secondary | ICD-10-CM | POA: Diagnosis not present

## 2015-11-11 DIAGNOSIS — E785 Hyperlipidemia, unspecified: Secondary | ICD-10-CM | POA: Diagnosis not present

## 2015-11-11 DIAGNOSIS — R7301 Impaired fasting glucose: Secondary | ICD-10-CM | POA: Diagnosis not present

## 2015-11-14 DIAGNOSIS — E041 Nontoxic single thyroid nodule: Secondary | ICD-10-CM | POA: Diagnosis not present

## 2015-11-14 DIAGNOSIS — E042 Nontoxic multinodular goiter: Secondary | ICD-10-CM | POA: Diagnosis not present

## 2015-12-09 DIAGNOSIS — F4320 Adjustment disorder, unspecified: Secondary | ICD-10-CM | POA: Diagnosis not present

## 2015-12-09 DIAGNOSIS — F330 Major depressive disorder, recurrent, mild: Secondary | ICD-10-CM | POA: Diagnosis not present

## 2015-12-10 DIAGNOSIS — Z1231 Encounter for screening mammogram for malignant neoplasm of breast: Secondary | ICD-10-CM | POA: Diagnosis not present

## 2016-01-07 DIAGNOSIS — Z23 Encounter for immunization: Secondary | ICD-10-CM | POA: Diagnosis not present

## 2017-08-24 DIAGNOSIS — I10 Essential (primary) hypertension: Secondary | ICD-10-CM | POA: Diagnosis not present

## 2017-08-24 DIAGNOSIS — R918 Other nonspecific abnormal finding of lung field: Secondary | ICD-10-CM

## 2017-08-24 DIAGNOSIS — R471 Dysarthria and anarthria: Secondary | ICD-10-CM

## 2017-08-24 DIAGNOSIS — I639 Cerebral infarction, unspecified: Secondary | ICD-10-CM

## 2017-08-24 DIAGNOSIS — K219 Gastro-esophageal reflux disease without esophagitis: Secondary | ICD-10-CM | POA: Diagnosis not present

## 2017-08-24 DIAGNOSIS — I5032 Chronic diastolic (congestive) heart failure: Secondary | ICD-10-CM | POA: Diagnosis not present

## 2017-08-24 DIAGNOSIS — E785 Hyperlipidemia, unspecified: Secondary | ICD-10-CM | POA: Diagnosis not present

## 2017-08-24 DIAGNOSIS — R531 Weakness: Secondary | ICD-10-CM

## 2017-08-25 DIAGNOSIS — I10 Essential (primary) hypertension: Secondary | ICD-10-CM | POA: Diagnosis not present

## 2017-08-25 DIAGNOSIS — R471 Dysarthria and anarthria: Secondary | ICD-10-CM | POA: Diagnosis not present

## 2017-08-25 DIAGNOSIS — R531 Weakness: Secondary | ICD-10-CM | POA: Diagnosis not present

## 2017-08-25 DIAGNOSIS — R918 Other nonspecific abnormal finding of lung field: Secondary | ICD-10-CM | POA: Diagnosis not present

## 2017-08-25 DIAGNOSIS — I5032 Chronic diastolic (congestive) heart failure: Secondary | ICD-10-CM | POA: Diagnosis not present

## 2017-08-25 DIAGNOSIS — I639 Cerebral infarction, unspecified: Secondary | ICD-10-CM | POA: Diagnosis not present

## 2017-08-25 DIAGNOSIS — K219 Gastro-esophageal reflux disease without esophagitis: Secondary | ICD-10-CM | POA: Diagnosis not present

## 2017-08-25 DIAGNOSIS — E785 Hyperlipidemia, unspecified: Secondary | ICD-10-CM | POA: Diagnosis not present

## 2017-08-25 DIAGNOSIS — I6789 Other cerebrovascular disease: Secondary | ICD-10-CM

## 2017-10-14 ENCOUNTER — Encounter

## 2017-10-14 ENCOUNTER — Encounter: Payer: Self-pay | Admitting: Neurology

## 2017-10-14 ENCOUNTER — Ambulatory Visit: Payer: Medicare Other | Admitting: Neurology

## 2017-10-14 VITALS — BP 102/65 | HR 76 | Ht 65.0 in | Wt 178.0 lb

## 2017-10-14 DIAGNOSIS — I6381 Other cerebral infarction due to occlusion or stenosis of small artery: Secondary | ICD-10-CM

## 2017-10-14 DIAGNOSIS — I671 Cerebral aneurysm, nonruptured: Secondary | ICD-10-CM | POA: Diagnosis not present

## 2017-10-14 DIAGNOSIS — I639 Cerebral infarction, unspecified: Secondary | ICD-10-CM

## 2017-10-14 DIAGNOSIS — G3184 Mild cognitive impairment, so stated: Secondary | ICD-10-CM | POA: Diagnosis not present

## 2017-10-14 HISTORY — DX: Cerebral infarction, unspecified: I63.9

## 2017-10-14 HISTORY — DX: Cerebral aneurysm, nonruptured: I67.1

## 2017-10-14 HISTORY — DX: Other cerebral infarction due to occlusion or stenosis of small artery: I63.81

## 2017-10-14 HISTORY — DX: Mild cognitive impairment of uncertain or unknown etiology: G31.84

## 2017-10-14 NOTE — Patient Instructions (Signed)
I had a long d/w patient and her daughter about her recent lacunar stroke, risk for recurrent stroke/TIAs, personally independently reviewed imaging studies and stroke evaluation results and answered questions.Continue aspirin 81 mg daily  for secondary stroke prevention but discontinued Plavix added has been greater than 3 weeks since her stroke and maintain strict control of hypertension with blood pressure goal below 130/90, diabetes with hemoglobin A1c goal below 6.5% and lipids with LDL cholesterol goal below 70 mg/dL. I also advised the patient to eat a healthy diet with plenty of whole grains, cereals, fruits and vegetables, exercise regularly and maintain ideal body weight check echocardiogram, lipid profile and hemoglobin A1c..I also discussed memory compensation strategies for her mild cognitive impairment. I recommend conservative follow-up for her small unruptured intracranial aneurysms. Recommend follow-up CT angiogram in 6 months. Followup in the future with me in  3 months or call earlier if necessary   Stroke Prevention Some medical conditions and behaviors are associated with a higher chance of having a stroke. You can help prevent a stroke by making nutrition, lifestyle, and other changes, including managing any medical conditions you may have. What nutrition changes can be made?  Eat healthy foods. You can do this by: ? Choosing foods high in fiber, such as fresh fruits and vegetables and whole grains. ? Eating at least 5 or more servings of fruits and vegetables a day. Try to fill half of your plate at each meal with fruits and vegetables. ? Choosing lean protein foods, such as lean cuts of meat, poultry without skin, fish, tofu, beans, and nuts. ? Eating low-fat dairy products. ? Avoiding foods that are high in salt (sodium). This can help lower blood pressure. ? Avoiding foods that have saturated fat, trans fat, and cholesterol. This can help prevent high cholesterol. ? Avoiding  processed and premade foods.  Follow your health care provider's specific guidelines for losing weight, controlling high blood pressure (hypertension), lowering high cholesterol, and managing diabetes. These may include: ? Reducing your daily calorie intake. ? Limiting your daily sodium intake to 1,500 milligrams (mg). ? Using only healthy fats for cooking, such as olive oil, canola oil, or sunflower oil. ? Counting your daily carbohydrate intake. What lifestyle changes can be made?  Maintain a healthy weight. Talk to your health care provider about your ideal weight.  Get at least 30 minutes of moderate physical activity at least 5 days a week. Moderate activity includes brisk walking, biking, and swimming.  Do not use any products that contain nicotine or tobacco, such as cigarettes and e-cigarettes. If you need help quitting, ask your health care provider. It may also be helpful to avoid exposure to secondhand smoke.  Limit alcohol intake to no more than 1 drink a day for nonpregnant women and 2 drinks a day for men. One drink equals 12 oz of beer, 5 oz of wine, or 1 oz of hard liquor.  Stop any illegal drug use.  Avoid taking birth control pills. Talk to your health care provider about the risks of taking birth control pills if: ? You are over 8 years old. ? You smoke. ? You get migraines. ? You have ever had a blood clot. What other changes can be made?  Manage your cholesterol levels. ? Eating a healthy diet is important for preventing high cholesterol. If cholesterol cannot be managed through diet alone, you may also need to take medicines. ? Take any prescribed medicines to control your cholesterol as told by your health  care provider.  Manage your diabetes. ? Eating a healthy diet and exercising regularly are important parts of managing your blood sugar. If your blood sugar cannot be managed through diet and exercise, you may need to take medicines. ? Take any prescribed  medicines to control your diabetes as told by your health care provider.  Control your hypertension. ? To reduce your risk of stroke, try to keep your blood pressure below 130/80. ? Eating a healthy diet and exercising regularly are an important part of controlling your blood pressure. If your blood pressure cannot be managed through diet and exercise, you may need to take medicines. ? Take any prescribed medicines to control hypertension as told by your health care provider. ? Ask your health care provider if you should monitor your blood pressure at home. ? Have your blood pressure checked every year, even if your blood pressure is normal. Blood pressure increases with age and some medical conditions.  Get evaluated for sleep disorders (sleep apnea). Talk to your health care provider about getting a sleep evaluation if you snore a lot or have excessive sleepiness.  Take over-the-counter and prescription medicines only as told by your health care provider. Aspirin or blood thinners (antiplatelets or anticoagulants) may be recommended to reduce your risk of forming blood clots that can lead to stroke.  Make sure that any other medical conditions you have, such as atrial fibrillation or atherosclerosis, are managed. What are the warning signs of a stroke? The warning signs of a stroke can be easily remembered as BEFAST.  B is for balance. Signs include: ? Dizziness. ? Loss of balance or coordination. ? Sudden trouble walking.  E is for eyes. Signs include: ? A sudden change in vision. ? Trouble seeing.  F is for face. Signs include: ? Sudden weakness or numbness of the face. ? The face or eyelid drooping to one side.  A is for arms. Signs include: ? Sudden weakness or numbness of the arm, usually on one side of the body.  S is for speech. Signs include: ? Trouble speaking (aphasia). ? Trouble understanding.  T is for time. ? These symptoms may represent a serious problem that is  an emergency. Do not wait to see if the symptoms will go away. Get medical help right away. Call your local emergency services (911 in the [place] Do not drive yourself to the hospital.  Other signs of stroke may include: ? A sudden, severe headache with no known cause. ? Nausea or vomiting. ? Seizure.  Where to find more information: For more information, visit:  American Stroke Association: www.strokeassociation.org  National Stroke Association: www.stroke.org  Summary  You can prevent a stroke by eating healthy, exercising, not smoking, limiting alcohol intake, and managing any medical conditions you may have.  Do not use any products that contain nicotine or tobacco, such as cigarettes and e-cigarettes. If you need help quitting, ask your health care provider. It may also be helpful to avoid exposure to secondhand smoke.  Remember BEFAST for warning signs of stroke. Get help right away if you or a loved one has any of these signs. This information is not intended to replace advice given to you by your health care provider. Make sure you discuss any questions you have with your health care provider. Document Released: 04/23/2004 Document Revised: 04/21/2016 Document Reviewed: 04/21/2016 Elsevier Interactive Patient Education  2018 [employer].  Memory Compensation Strategies  1. Use "WARM" strategy.  W= write it down  A=  associate it  R= repeat it  M= make a mental note  2.   You can keep a Glass blower/designerMemory Notebook.  Use a 3-ring notebook with sections for the following: calendar, important names and phone numbers,  medications, doctors' names/phone numbers, lists/reminders, and a section to journal what you did  each day.   3.    Use a calendar to write appointments down.  4.    Write yourself a schedule for the day.  This can be placed on the calendar or in a separate section of the Memory Notebook.  Keeping a  regular schedule can help memory.  5.    Use medication organizer  with sections for each day or morning/evening pills.  You may need help loading it  6.    Keep a basket, or pegboard by the door.  Place items that you need to take out with you in the basket or on the pegboard.  You may also want to  include a message board for reminders.  7.    Use sticky notes.  Place sticky notes with reminders in a place where the task is performed.  For example: " turn off the  stove" placed by the stove, "lock the door" placed on the door at eye level, " take your medications" on  the bathroom mirror or by the place where you normally take your medications.  8.    Use alarms/timers.  Use while cooking to remind yourself to check on food or as a reminder to take your medicine, or as a  reminder to make a call, or as a reminder to perform another task, etc.

## 2017-10-14 NOTE — Progress Notes (Signed)
[facility] [address] [place]. [place] [id number] [phone]       OFFICE CONSULT NOTE  Ms. Chloe BravoNancy Gray Date of Birth:  April 07, 1950 Medical Record Number:  [id number]   Referring MD:  Bertell MariaMai Won  Reason for Referral:  stroke  HPI: [name] is a 67 year Caucasian lady seen today for first office consultation visit for stroke. She is accompanied by her daughter Chloe Gray. History is obtained from them as well as review of medical records sent with her and have personally reviewed imaging [facility].. On 08/24/17 the patient went to [employer] for dinner with her husband and grandson. She was holding a cheeseburger and a drink in her hand and family noticed that she was about to drop it and appeared to be sleepy and unable to speak clearly. She was taken to [facility] where she was seen by attending neurologist. There was some mention in history about having some trouble the night before and patient's deficits were fluctuating and mild and TPA was not given. She subsequently was found to have right facial droop and some mild right-sided weakness. Eventually MRI was obtained which showed a left thalamic acute lacunar infarct. Patient did have a prior history of left brain stroke in 2016 at that time she had right approximately numbness and speech difficulties but she made a full recovery from that. CT angiogram of the brain and neck were obtained at [facility] showed only mild orthostatic changes. There were  small 2 x 3 mm aneurysm noted in posterior communicating arteries bilaterally. Patient states she's done well since discharge. She is still having some mild short-term memory difficulties and has trouble remembering recent information. She has been started on aspirin and Plavix which is taken to the last 3 weeks. She is tolerating well without bruising or bleeding. She has not had an echocardiogram and it's unclear whether lipid profile and hemoglobin A 1C  were checked. Patient does have family history of any examined one of her cousins who had a brain aneurysm requiring treatment. She denies any headaches, slurred speech or focal extremity weakness or any new neurological problems.  ROS:   14 system review of systems is positive for  chills, fatigue, hearing loss, snoring, constipation, easy bruising, feeling cold, slurred speech, sleepiness, snoring, depression, anxiety, decreased energy, disinterest in activities and all other systems negative  PMH:  Past Medical History:  Diagnosis Date  . Anxiety   . Arthritis   . Asthma    PFT 12/12  PCP on chart  . Depression    states well controlled  . GERD (gastroesophageal reflux disease)   . H/O hiatal hernia   . Hypercholesterolemia   . Hypertension    clearance note with OV Dr Lorin PicketScott, EKG on chart 03/09/11  . Pneumonia   . Shortness of breath    with asthma  . Stroke (HCC)   . Vitamin B 12 deficiency     Social History:  Social History   Socioeconomic History  . Marital status: Married    Spouse name: Not on file  . Number of children: Not on file  . Years of education: Not on file  . Highest education level: Not on file  Occupational History  . Not on file  Social Needs  . Financial resource strain: Not on file  . Food insecurity:    Worry: Not on file    Inability: Not on file  . Transportation needs:    Medical: Not on  file    Non-medical: Not on file  Tobacco Use  . Smoking status: Never Smoker  . Smokeless tobacco: Never Used  Substance and Sexual Activity  . Alcohol use: No  . Drug use: No  . Sexual activity: Not on file  Lifestyle  . Physical activity:    Days per week: Not on file    Minutes per session: Not on file  . Stress: Not on file  Relationships  . Social connections:    Talks on phone: Not on file    Gets together: Not on file    Attends religious service: Not on file    Active member of club or organization: Not on file    Attends meetings of  clubs or organizations: Not on file    Relationship status: Not on file  . Intimate partner violence:    Fear of current or ex partner: Not on file    Emotionally abused: Not on file    Physically abused: Not on file    Forced sexual activity: Not on file  Other Topics Concern  . Not on file  Social History Narrative  . Not on file    Medications:   Current Outpatient Medications on File Prior to Visit  Medication Sig Dispense Refill  . albuterol (PROVENTIL HFA;VENTOLIN HFA) 108 (90 BASE) MCG/ACT inhaler Inhale 2 puffs into the lungs every 6 (six) hours as needed. For shortness of breath.    Marland Kitchen albuterol (PROVENTIL) (2.5 MG/3ML) 0.083% nebulizer solution Take 2.5 mg by nebulization every 6 (six) hours as needed. For shortness of breath.    . ALPRAZolam (XANAX) 0.5 MG tablet Take by mouth.    . ALPRAZolam (XANAX) 1 MG tablet Take 1 mg by mouth 3 (three) times daily with meals.     Marland Kitchen amLODipine (NORVASC) 10 MG tablet Take 10 mg by mouth daily.    . ASPIRIN 81 PO Take by mouth.    Marland Kitchen buPROPion (WELLBUTRIN XL) 150 MG 24 hr tablet Take 150 mg by mouth daily.    . celecoxib (CELEBREX) 200 MG capsule TAKE 1 CAPSULE TWICE DAILY    . cyanocobalamin (,VITAMIN B-12,) 1000 MCG/ML injection Inject 100 mcg into the muscle every 30 (thirty) days.    Marland Kitchen dicyclomine (BENTYL) 20 MG tablet Take 20 mg by mouth every 6 (six) hours.    . Fluticasone-Salmeterol (ADVAIR) 250-50 MCG/DOSE AEPB Inhale 1 puff into the lungs every 12 (twelve) hours.    . furosemide (LASIX) 20 MG tablet TAKE ONE TABLET DAILY AS NEEDED FOR SWELLING OF LEGS  1  . LISINOPRIL PO Take 20 mg by mouth.    . ranitidine (ZANTAC) 75 MG tablet Take 75 mg by mouth 2 (two) times daily.    . rosuvastatin (CRESTOR) 10 MG tablet Take by mouth.    . sodium bicarbonate 650 MG tablet Take 1,300 mg by mouth 2 (two) times daily.  3  . traZODone (DESYREL) 100 MG tablet Take 100 mg by mouth at bedtime.    . TRINTELLIX 20 MG TABS tablet TAKE 1 TABLET BY  MOUTH EVERY DAY IN THE MORNING  1   No current facility-administered medications on file prior to visit.     Allergies:   Allergies  Allergen Reactions  . Latex Rash    Topical rash with peeling skin    Physical Exam General: well developed, well nourished middle-age Caucasian lady, seated, in no evident distress Head: head normocephalic and atraumatic.   Neck: supple with no carotid or  supraclavicular bruits Cardiovascular: regular rate and rhythm, no murmurs Musculoskeletal: no deformity Skin:  no rash/petichiae Vascular:  Normal pulses all extremities  Neurologic Exam Mental Status: Awake and fully alert. Oriented to place and time. Recent and remote memory intact. Attention span, concentration and fund of knowledge appropriate. Mood and affect appropriate. Diminished recall 2/3. Able to name only 6 animals with full legs. Cranial Nerves: Fundoscopic exam reveals sharp disc margins. Pupils equal, briskly reactive to light. Extraocular movements full without nystagmus. Visual fields full to confrontation. Hearing intact. Facial sensation intact. Mild right lower facial asymmetry. tongue, palate moves normally and symmetrically.  Motor: Normal bulk and tone. Normal strength in all tested extremity muscles. Sensory.: intact to touch , pinprick , position and vibratory sensation.  Coordination: Rapid alternating movements normal in all extremities. Finger-to-nose and heel-to-shin performed accurately bilaterally. Gait and Station: Arises from chair without difficulty. Stance is normal. Gait demonstrates normal stride length and balance . Able to heel, toe and tandem walk with mild  difficulty.  Reflexes: 1+ and symmetric. Toes downgoing.   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 Mild post stroke cognitive impairment. 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 Vascular risk factors of hypertension hyperlipidemia and cerebrovascular disease     PLAN: I had a long d/w patient and her daughter about her recent lacunar stroke, risk for recurrent stroke/TIAs, personally independently reviewed imaging studies and stroke evaluation results and answered questions.Continue aspirin 81 mg daily  for secondary stroke prevention but discontinued Plavix added has been greater than 3 weeks since her stroke and maintain strict control of hypertension with blood pressure goal below 130/90, diabetes with hemoglobin A1c goal below 6.5% and lipids with LDL cholesterol goal below 70 mg/dL. I also advised the patient to eat a healthy diet with plenty of whole grains, cereals, fruits and vegetables, exercise regularly and maintain ideal body weight check echocardiogram, lipid profile and hemoglobin A1c..I also discussed memory compensation strategies for her mild cognitive impairment. I recommend conservative follow-up for her small unruptured intracranial aneurysms. Recommend follow-up CT angiogram in 6 months. Followup in the future with me in  3 months or call earlier if necessary. Greater than 50% time during this 45 minute consultation visit for spent on counseling and coordination of care about her lacunar infarct, mild cognitive impairment, small intracranial aneurysms and answered questions Delia Heady, MD  [facility] [address] [place], [place] [address]  Phone [phone] Fax [phone] Note: This document was prepared with digital dictation and possible smart phrase technology. Any transcriptional errors that result from this process are unintentional.

## 2017-10-15 LAB — LIPID PANEL
CHOLESTEROL TOTAL: 146 mg/dL (ref 100–199)
Chol/HDL Ratio: 2.8 ratio (ref 0.0–4.4)
HDL: 53 mg/dL (ref >39)
LDL CALC: 66 mg/dL (ref 0–99)
TRIGLYCERIDES: 134 mg/dL (ref 0–149)
VLDL CHOLESTEROL CAL: 27 mg/dL (ref 5–40)

## 2017-10-15 LAB — HEMOGLOBIN A1C
Est. average glucose Bld gHb Est-mCnc: 117 mg/dL
Hgb A1c MFr Bld: 5.7 % — ABNORMAL HIGH (ref 4.8–5.6)

## 2017-10-20 ENCOUNTER — Telehealth: Payer: Self-pay

## 2017-10-20 NOTE — Telephone Encounter (Signed)
-----   Message from Micki RileyPramod S Sethi, MD sent at 10/19/2017  5:43 PM EDT ----- Joneen RoachKindly inform the patient that her lipid profile and screening tests for diabetes are  both satisfactory

## 2017-10-20 NOTE — Telephone Encounter (Signed)
Notes recorded by Hildred AlaminMurrell, Katrina Y, RN on 10/20/2017 at 5:19 PM EDT Rn call patient that the lipid panel and screening for diabetes were normal. PT verbalized understanding.

## 2017-10-21 ENCOUNTER — Ambulatory Visit (HOSPITAL_COMMUNITY): Payer: Medicare Other | Attending: Cardiology

## 2017-10-21 ENCOUNTER — Other Ambulatory Visit: Payer: Self-pay

## 2017-10-21 ENCOUNTER — Telehealth: Payer: Self-pay | Admitting: Neurology

## 2017-10-21 DIAGNOSIS — I10 Essential (primary) hypertension: Secondary | ICD-10-CM | POA: Insufficient documentation

## 2017-10-21 DIAGNOSIS — I639 Cerebral infarction, unspecified: Secondary | ICD-10-CM | POA: Insufficient documentation

## 2017-10-21 DIAGNOSIS — I071 Rheumatic tricuspid insufficiency: Secondary | ICD-10-CM | POA: Diagnosis not present

## 2017-10-21 DIAGNOSIS — E785 Hyperlipidemia, unspecified: Secondary | ICD-10-CM | POA: Diagnosis not present

## 2017-10-21 DIAGNOSIS — J45909 Unspecified asthma, uncomplicated: Secondary | ICD-10-CM | POA: Diagnosis not present

## 2017-10-21 DIAGNOSIS — I6381 Other cerebral infarction due to occlusion or stenosis of small artery: Secondary | ICD-10-CM

## 2017-10-21 DIAGNOSIS — I253 Aneurysm of heart: Secondary | ICD-10-CM | POA: Insufficient documentation

## 2017-10-21 NOTE — Telephone Encounter (Signed)
Called the patient and reviewed the Echo results with her husband listed on the DPR. He verbalized understanding. Informed him to let the patient know to call if she had questions.

## 2017-10-21 NOTE — Telephone Encounter (Signed)
-----   Message from Pramod S Sethi, MD sent at 10/21/2017  3:22 PM EDT ----- Kindly inform the patient that echocardiogram study was unremarkable 

## 2017-10-22 ENCOUNTER — Telehealth: Payer: Self-pay

## 2017-10-22 NOTE — Telephone Encounter (Signed)
I spoke with patient and made her aware of these results. She voiced understanding and appreciation.

## 2017-10-22 NOTE — Telephone Encounter (Signed)
-----   Message from Micki RileyPramod S Sethi, MD sent at 10/21/2017  3:22 PM EDT ----- Joneen RoachKindly inform the patient that echocardiogram study was unremarkable

## 2017-11-09 ENCOUNTER — Ambulatory Visit: Payer: Self-pay | Admitting: Neurology

## 2017-11-09 ENCOUNTER — Encounter

## 2018-01-17 ENCOUNTER — Encounter: Payer: Self-pay | Admitting: Neurology

## 2018-01-17 ENCOUNTER — Telehealth: Payer: Self-pay | Admitting: Neurology

## 2018-01-17 ENCOUNTER — Ambulatory Visit: Payer: Medicare Other | Admitting: Neurology

## 2018-01-17 VITALS — BP 120/76 | HR 86 | Ht 65.0 in | Wt 180.2 lb

## 2018-01-17 DIAGNOSIS — I671 Cerebral aneurysm, nonruptured: Secondary | ICD-10-CM | POA: Diagnosis not present

## 2018-01-17 DIAGNOSIS — I6381 Other cerebral infarction due to occlusion or stenosis of small artery: Secondary | ICD-10-CM

## 2018-01-17 MED ORDER — ASPIRIN 81 MG PO TBEC: 81.0000 mg | DELAYED_RELEASE_TABLET | Freq: Every day | ORAL | 12 refills | Status: DC

## 2018-01-17 NOTE — Telephone Encounter (Signed)
lvm for pt to be aware of this also left [facility] phone number of [phone] and to call them if she has not heard in the next 2-3 business days.

## 2018-01-17 NOTE — Progress Notes (Signed)
[facility] [address] [place]. [place] [address] [phone]       OFFICE CONSULT NOTE  [name] Date of Birth:  08/06/1950 Medical Record Number:  [id number]   Referring MD:  Bertell Maria  Reason for Referral:  stroke  HPI: Initial visit 10/14/17 :  [name] is a 67 year Caucasian lady seen today for first office consultation visit for stroke. She is accompanied by her daughter Chloe Gray. History is obtained from them as well as review of medical records sent with her and have personally reviewed imaging films from [facility].. On 08/24/17 the patient went to [employer] for dinner with her husband and grandson. She was holding a cheeseburger and a drink in her hand and family noticed that she was about to drop it and appeared to be sleepy and unable to speak clearly. She was taken to [facility] where she was seen by attending neurologist. There was some mention in history about having some trouble the night before and patient's deficits were fluctuating and mild and TPA was not given. She subsequently was found to have right facial droop and some mild right-sided weakness. Eventually MRI was obtained which showed a left thalamic acute lacunar infarct. Patient did have a prior history of left brain stroke in 2016 at that time she had right approximately numbness and speech difficulties but she made a full recovery from that. CT angiogram of the brain and neck were obtained at [facility] showed only mild orthostatic changes. There were  small 2 x 3 mm aneurysm noted in posterior communicating arteries bilaterally. Patient states she's done well since discharge. She is still having some mild short-term memory difficulties and has trouble remembering recent information. She has been started on aspirin and Plavix which is taken to the last 3 weeks. She is tolerating well without bruising or bleeding. She has not had an echocardiogram and it's unclear whether lipid  profile and hemoglobin A 1C were checked. Patient does have family history of any examined one of her cousins who had a brain aneurysm requiring treatment. She denies any headaches, slurred speech or focal extremity weakness or any new neurological problems. Update 01/17/2018 : She returns for follow-up after last visit 3 months ago.  She is accompanied by her husband.  She is doing well.  She has had no recurrent TIA or stroke symptoms.  She is tolerating aspirin well with only minor bruising and no bleeding.  Her blood pressure is well controlled and today it is 120/76.  She remains on Crestor which she is tolerating well without muscle aches and pains.  She had transthoracic echocardiogram done on 10/21/2017 which showed normal ejection fraction without cardiac source of embolism.  Lab work on 10/14/2017 showed LDL cholesterol of 66 mg percent and hemoglobin A1c of 5.7.  She has no new complaints.  She states of mild short-term memory difficulties unchanged. ROS:   14 system review of systems is positive for   activity change, fever, constipation, cold intolerance, neck pain, depression, nervousness, anxiety and all other systems negative  PMH:  Past Medical History:  Diagnosis Date  . Anxiety   . Arthritis   . Asthma    PFT 12/12  PCP on chart  . Depression    states well controlled  . GERD (gastroesophageal reflux disease)   . H/O hiatal hernia   . Hypercholesterolemia   . Hypertension    clearance note with OV Dr Lorin Picket, EKG on chart 03/09/11  .  Pneumonia   . Shortness of breath    with asthma  . Stroke (HCC)   . Vitamin B 12 deficiency     Social History:  Social History   Socioeconomic History  . Marital status: Married    Spouse name: Not on file  . Number of children: Not on file  . Years of education: Not on file  . Highest education level: Not on file  Occupational History  . Not on file  Social Needs  . Financial resource strain: Not on file  . Food insecurity:     Worry: Not on file    Inability: Not on file  . Transportation needs:    Medical: Not on file    Non-medical: Not on file  Tobacco Use  . Smoking status: Never Smoker  . Smokeless tobacco: Never Used  Substance and Sexual Activity  . Alcohol use: No  . Drug use: No  . Sexual activity: Not on file  Lifestyle  . Physical activity:    Days per week: Not on file    Minutes per session: Not on file  . Stress: Not on file  Relationships  . Social connections:    Talks on phone: Not on file    Gets together: Not on file    Attends religious service: Not on file    Active member of club or organization: Not on file    Attends meetings of clubs or organizations: Not on file    Relationship status: Not on file  . Intimate partner violence:    Fear of current or ex partner: Not on file    Emotionally abused: Not on file    Physically abused: Not on file    Forced sexual activity: Not on file  Other Topics Concern  . Not on file  Social History Narrative  . Not on file    Medications:   Current Outpatient Medications on File Prior to Visit  Medication Sig Dispense Refill  . albuterol (PROVENTIL HFA;VENTOLIN HFA) 108 (90 BASE) MCG/ACT inhaler Inhale 2 puffs into the lungs every 6 (six) hours as needed. For shortness of breath.    Marland Kitchen albuterol (PROVENTIL) (2.5 MG/3ML) 0.083% nebulizer solution Take 2.5 mg by nebulization every 6 (six) hours as needed. For shortness of breath.    . ALPRAZolam (XANAX) 1 MG tablet Take 1 mg by mouth 3 (three) times daily with meals.     Marland Kitchen amLODipine (NORVASC) 10 MG tablet Take 10 mg by mouth daily.    Marland Kitchen buPROPion (WELLBUTRIN XL) 150 MG 24 hr tablet Take 150 mg by mouth daily.    . celecoxib (CELEBREX) 200 MG capsule TAKE 1 CAPSULE TWICE DAILY    . cyanocobalamin (,VITAMIN B-12,) 1000 MCG/ML injection Inject 100 mcg into the muscle every 30 (thirty) days.    Marland Kitchen dicyclomine (BENTYL) 20 MG tablet Take 20 mg by mouth every 6 (six) hours.    . furosemide  (LASIX) 20 MG tablet TAKE ONE TABLET DAILY AS NEEDED FOR SWELLING OF LEGS  1  . LISINOPRIL PO Take 20 mg by mouth.    . ranitidine (ZANTAC) 75 MG tablet Take 75 mg by mouth 2 (two) times daily.    . rosuvastatin (CRESTOR) 10 MG tablet Take by mouth.    . sodium bicarbonate 650 MG tablet Take 1,300 mg by mouth 2 (two) times daily.  3  . traZODone (DESYREL) 100 MG tablet Take 100 mg by mouth at bedtime.    . TRINTELLIX 20 MG  TABS tablet TAKE 1 TABLET BY MOUTH EVERY DAY IN THE MORNING  1   No current facility-administered medications on file prior to visit.     Allergies:   Allergies  Allergen Reactions  . Latex Rash    Topical rash with peeling skin    Physical Exam General: well developed, well nourished middle-age Caucasian lady, seated, in no evident distress Head: head normocephalic and atraumatic.   Neck: supple with no carotid or supraclavicular bruits Cardiovascular: regular rate and rhythm, no murmurs Musculoskeletal: no deformity Skin:  no rash/petichiae Vascular:  Normal pulses all extremities  Neurologic Exam Mental Status: Awake and fully alert. Oriented to place and time. Recent and remote memory intact. Attention span, concentration and fund of knowledge appropriate. Mood and affect appropriate. Diminished recall 2/3. Able to name only 6 animals with full legs. Cranial Nerves: Fundoscopic exam  Not done. Pupils equal, briskly reactive to light. Extraocular movements full without nystagmus. Visual fields full to confrontation. Hearing intact. Facial sensation intact. Mild right lower facial asymmetry. tongue, palate moves normally and symmetrically.  Motor: Normal bulk and tone. Normal strength in all tested extremity muscles. Sensory.: intact to touch , pinprick , position and vibratory sensation.  Coordination: Rapid alternating movements normal in all extremities. Finger-to-nose and heel-to-shin performed accurately bilaterally. Gait and Station: Arises from chair  without difficulty. Stance is normal. Gait demonstrates normal stride length and balance . Able to heel, toe and tandem walk with mild  difficulty.  Reflexes: 1+ and symmetric. Toes downgoing.       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  . 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 Vascular risk factors of hypertension hyperlipidemia and cerebrovascular disease     PLAN: I had a long d/w patient and her husband about her recent  Lacunar thalamic stroke, asymptomatic cerebral aneurysms,risk for recurrent stroke/TIAs, personally independently reviewed imaging studies and stroke evaluation results and answered questions.Continue aspirin 81 mg daily  for secondary stroke prevention and maintain strict control of hypertension with blood pressure goal below 130/90, diabetes with hemoglobin A1c goal below 6.5% and lipids with LDL cholesterol goal below 70 mg/dL. I also advised the patient to eat a healthy diet with plenty of whole grains, cereals, fruits and vegetables, exercise regularly and maintain ideal body weight . Check f/u CT angiogram to follow her small bilateral  PCA aneurysmsFollowup in the future with me/ stroke NP  in 1 year or call earlier if necessary Greater than 50% time during this 25 minute   visit for spent on counseling and coordination of care about her lacunar infarct, mild cognitive impairment, small intracranial aneurysms and answered questions Delia Heady, MD  [facility] [address] [place], [place] [address]  Phone [phone] Fax [phone] Note: This document was prepared with digital dictation and possible smart phrase technology. Any transcriptional errors that result from this process are unintentional.

## 2018-01-17 NOTE — Telephone Encounter (Signed)
[employer] Medicare order sent to [facility]. No auth they will reach out to the pt to schedule.  °

## 2018-01-17 NOTE — Patient Instructions (Signed)
I had a long d/w patient and her husband about her recent  Lacunar thalamic stroke, asymptomatic cerebral aneurysms,risk for recurrent stroke/TIAs, personally independently reviewed imaging studies and stroke evaluation results and answered questions.Continue aspirin 81 mg daily  for secondary stroke prevention and maintain strict control of hypertension with blood pressure goal below 130/90, diabetes with hemoglobin A1c goal below 6.5% and lipids with LDL cholesterol goal below 70 mg/dL. I also advised the patient to eat a healthy diet with plenty of whole grains, cereals, fruits and vegetables, exercise regularly and maintain ideal body weight . Check f/u CT angiogram to follow her small bilateral  PCA aneurysmsFollowup in the future with me/ stroke NP  in 1 year or call earlier if necessary

## 2018-01-24 ENCOUNTER — Other Ambulatory Visit: Payer: Medicare Other

## 2018-01-28 ENCOUNTER — Ambulatory Visit
Admission: RE | Admit: 2018-01-28 | Discharge: 2018-01-28 | Disposition: A | Discharge status: Home / Self Care | Payer: Medicare Other | Source: Ambulatory Visit | Attending: Neurology | Admitting: Neurology

## 2018-01-28 DIAGNOSIS — I671 Cerebral aneurysm, nonruptured: Secondary | ICD-10-CM

## 2018-01-28 MED ORDER — IOPAMIDOL (ISOVUE-370) INJECTION 76%
75.0000 mL | Freq: Once | INTRAVENOUS | Status: AC | PRN
  Administered 2018-01-28: 75 mL via INTRAVENOUS

## 2018-02-02 ENCOUNTER — Telehealth: Payer: Self-pay | Admitting: *Deleted

## 2018-02-02 NOTE — Telephone Encounter (Signed)
LVM informing the patient that the CT angiogram of the brain shows stable appearance of a brain aneurysm without significant change from the study from May 2019. Advised her this is good news and left number for any questions.

## 2019-01-23 ENCOUNTER — Telehealth: Payer: Self-pay | Admitting: Adult Health

## 2019-01-23 ENCOUNTER — Other Ambulatory Visit: Payer: Self-pay

## 2019-01-23 ENCOUNTER — Encounter: Payer: Self-pay | Admitting: Adult Health

## 2019-01-23 ENCOUNTER — Ambulatory Visit: Payer: Medicare Other | Admitting: Adult Health

## 2019-01-23 VITALS — BP 127/80 | HR 89 | Temp 97.7°F | Ht 65.0 in | Wt 187.0 lb

## 2019-01-23 DIAGNOSIS — R2689 Other abnormalities of gait and mobility: Secondary | ICD-10-CM

## 2019-01-23 DIAGNOSIS — I639 Cerebral infarction, unspecified: Secondary | ICD-10-CM | POA: Diagnosis not present

## 2019-01-23 DIAGNOSIS — I671 Cerebral aneurysm, nonruptured: Secondary | ICD-10-CM

## 2019-01-23 DIAGNOSIS — G8191 Hemiplegia, unspecified affecting right dominant side: Secondary | ICD-10-CM

## 2019-01-23 DIAGNOSIS — E785 Hyperlipidemia, unspecified: Secondary | ICD-10-CM

## 2019-01-23 DIAGNOSIS — I10 Essential (primary) hypertension: Secondary | ICD-10-CM

## 2019-01-23 DIAGNOSIS — I6381 Other cerebral infarction due to occlusion or stenosis of small artery: Secondary | ICD-10-CM

## 2019-01-23 NOTE — Telephone Encounter (Signed)
Fax cover sheet will you fax referral to [facility] Telephone [phone] fax [phone] . Thanks Hinton Dyer

## 2019-01-23 NOTE — Telephone Encounter (Signed)
[employer] medicare order sent to [facility]. No auth they will reach out to the patient to schedule.  

## 2019-01-23 NOTE — Telephone Encounter (Signed)
Pt has called stating that her AVS states she is to still be taking the rosuvastatin (CRESTOR) 10 MG tablet, pt is asking for a call form RN to discuss

## 2019-01-23 NOTE — Telephone Encounter (Signed)
I called pt and relayed per JM/NP note pt is to take one daily baby 81mg  aspirin, and crestor 10mg  po daily.    Pt verbalized understanding.

## 2019-01-23 NOTE — Progress Notes (Signed)
[facility] [address] [place]. [place] [address] [phone]       FOLLOW UP NOTE  Chloe Gray Date of Birth:  [birth year] Medical Record Number:  [id number]    Reason for Referral:  Stroke  Chief Complaint  Patient presents with   Follow-up    Yearly f/u. Daughter present. Treatment room. Patient's daughter mentioned that she has is having gait issues. She has had one fall.      HPI: Initial visit 10/14/17 PS:  Chloe Gray is a 22 year Caucasian lady seen today for first office consultation visit for stroke. She is accompanied by her daughter Roselyn Reef. History is obtained from them as well as review of medical records sent with her and have personally reviewed imaging films from [facility].. On 08/24/17 the patient went to [employer] for dinner with her husband and grandson. She was holding a cheeseburger and a drink in her hand and family noticed that she was about to drop it and appeared to be sleepy and unable to speak clearly. She was taken to [facility] where she was seen by attending neurologist. There was some mention in history about having some trouble the night before and patient's deficits were fluctuating and mild and TPA was not given. She subsequently was found to have right facial droop and some mild right-sided weakness. Eventually MRI was obtained which showed a left thalamic acute lacunar infarct. Patient did have a prior history of left brain stroke in 2016 at that time she had right approximately numbness and speech difficulties but she made a full recovery from that. CT angiogram of the brain and neck were obtained at [facility] showed only mild orthostatic changes. There were  small 2 x 3 mm aneurysm noted in posterior communicating arteries bilaterally. Patient states she's done well since discharge. She is still having some mild short-term memory difficulties and has trouble remembering recent information. She has been started on  aspirin and Plavix which is taken to the last 3 weeks. She is tolerating well without bruising or bleeding. She has not had an echocardiogram and it's unclear whether lipid profile and hemoglobin A 1C were checked. Patient does have family history of any examined one of her cousins who had a brain aneurysm requiring treatment. She denies any headaches, slurred speech or focal extremity weakness or any new neurological problems. Update 01/17/2018 PS: She returns for follow-up after last visit 3 months ago.  She is accompanied by her husband.  She is doing well.  She has had no recurrent TIA or stroke symptoms.  She is tolerating aspirin well with only minor bruising and no bleeding.  Her blood pressure is well controlled and today it is 120/76.  She remains on Crestor which she is tolerating well without muscle aches and pains.  She had transthoracic echocardiogram done on 10/21/2017 which showed normal ejection fraction without cardiac source of embolism.  Lab work on 10/14/2017 showed LDL cholesterol of 66 mg percent and hemoglobin A1c of 5.7.  She has no new complaints.  She states of mild short-term memory difficulties unchanged.  Update 01/23/2019: Chloe Gray is a 68 year old female who is being seen today for stroke follow-up accompanied by her daughter.  Prior residual stroke deficits of mild right-sided weakness which has been gradually worsening over the past couple months with greater difficulty regarding balance resulting in falls.  She does admit to decreased activity and exercise due to pandemic restrictions.  Daughter states when she is ambulating, she  will frequently during her right foot or trip on her right toe leading to a fall.  Denies numbness/tingling, speech difficulty, visual changes or mental status changes.  She did have repeat CTA in 01/2018 for surveillance monitoring of known aneurysm which showed stable appearance of prior imaging in 07/2017.  She has been doing well from a stroke  standpoint without reoccurring or new stroke/TIA symptoms.  Continues on aspirin and Crestor for secondary stroke prevention with mild bruising but no bleeding or myalgias.  Blood pressure today 127/80.  No further concerns at this time.    ROS:   14 system review of systems is positive for activity change, gait difficulty and weakness and all other systems negative    PMH:  Past Medical History:  Diagnosis Date   Anxiety    Arthritis    Asthma    PFT 12/12  PCP on chart   Depression    states well controlled   GERD (gastroesophageal reflux disease)    H/O hiatal hernia    Hypercholesterolemia    Hypertension    clearance note with OV Dr Lorin PicketScott, EKG on chart 03/09/11   Pneumonia    Shortness of breath    with asthma   Stroke (HCC)    Vitamin B 12 deficiency     Social History:  Social History   Socioeconomic History   Marital status: Married    Spouse name: Not on file   Number of children: Not on file   Years of education: Not on file   Highest education level: Not on file  Occupational History   Not on file  Social Needs   Financial resource strain: Not on file   Food insecurity    Worry: Not on file    Inability: Not on file   Transportation needs    Medical: Not on file    Non-medical: Not on file  Tobacco Use   Smoking status: Never Smoker   Smokeless tobacco: Never Used  Substance and Sexual Activity   Alcohol use: No   Drug use: No   Sexual activity: Not on file  Lifestyle   Physical activity    Days per week: Not on file    Minutes per session: Not on file   Stress: Not on file  Relationships   Social connections    Talks on phone: Not on file    Gets together: Not on file    Attends religious service: Not on file    Active member of club or organization: Not on file    Attends meetings of clubs or organizations: Not on file    Relationship status: Not on file   Intimate partner violence    Fear of current or ex  partner: Not on file    Emotionally abused: Not on file    Physically abused: Not on file    Forced sexual activity: Not on file  Other Topics Concern   Not on file  Social History Narrative   Not on file    Medications:   Current Outpatient Medications on File Prior to Visit  Medication Sig Dispense Refill   albuterol (PROVENTIL HFA;VENTOLIN HFA) 108 (90 BASE) MCG/ACT inhaler Inhale 2 puffs into the lungs every 6 (six) hours as needed. For shortness of breath.     albuterol (PROVENTIL) (2.5 MG/3ML) 0.083% nebulizer solution Take 2.5 mg by nebulization every 6 (six) hours as needed. For shortness of breath.     ALPRAZolam (XANAX) 1 MG tablet Take 1  mg by mouth 3 (three) times daily with meals.      amLODipine (NORVASC) 10 MG tablet Take 10 mg by mouth daily.     aspirin (ASPIRIN 81) 81 MG EC tablet Take 1 tablet (81 mg total) by mouth daily. 30 tablet 12   buPROPion (WELLBUTRIN XL) 150 MG 24 hr tablet Take 150 mg by mouth daily.     celecoxib (CELEBREX) 200 MG capsule TAKE 1 CAPSULE TWICE DAILY     cyanocobalamin (,VITAMIN B-12,) 1000 MCG/ML injection Inject 100 mcg into the muscle every 30 (thirty) days.     dicyclomine (BENTYL) 20 MG tablet Take 20 mg by mouth every 6 (six) hours.     furosemide (LASIX) 20 MG tablet TAKE ONE TABLET DAILY AS NEEDED FOR SWELLING OF LEGS  1   LISINOPRIL PO Take 20 mg by mouth.     ranitidine (ZANTAC) 75 MG tablet Take 75 mg by mouth 2 (two) times daily.     rosuvastatin (CRESTOR) 10 MG tablet Take by mouth.     sodium bicarbonate 650 MG tablet Take 1,300 mg by mouth 2 (two) times daily.  3   traZODone (DESYREL) 100 MG tablet Take 100 mg by mouth at bedtime.     TRINTELLIX 20 MG TABS tablet TAKE 1 TABLET BY MOUTH EVERY DAY IN THE MORNING  1   No current facility-administered medications on file prior to visit.     Allergies:   Allergies  Allergen Reactions   Latex Rash    Topical rash with peeling skin    Physical  Exam  Today's Vitals   01/23/19 1030  BP: 127/80  Pulse: 89  Temp: 97.7 F (36.5 C)  TempSrc: Oral  Weight: 187 lb (84.8 kg)  Height: 5\' 5"  (1.651 m)   Body mass index is 31.12 kg/m.   General: well developed, well nourished pleasant middle-age Caucasian lady, seated, in no evident distress Head: head normocephalic and atraumatic.   Neck: supple with no carotid or supraclavicular bruits Cardiovascular: regular rate and rhythm, no murmurs Musculoskeletal: no deformity Skin:  no rash/petichiae Vascular:  Normal pulses all extremities  Neurologic Exam Mental Status: Awake and fully alert. Oriented to place and time. Recent and remote memory intact. Attention span, concentration and fund of knowledge appropriate. Mood and affect flat Cranial Nerves:  Pupils equal, briskly reactive to light. Extraocular movements full without nystagmus. Visual fields full to confrontation. Hearing intact. Facial sensation intact. Mild right lower facial asymmetry. tongue, palate moves normally and symmetrically.  Motor: Normal bulk and tone.  RUE: 5/5 with mildly weak grip strength and decreased dexterity RLE: 4/5 hip flexor weakness Full strength left upper and lower extremity Sensory.:  Decreased light touch, vibratory and pinprick sensation RLE Coordination: Rapid alternating movements diminished right hand. Finger-to-nose and heel-to-shin right-sided ataxia and normal on left. Gait and Station: Arises from chair without difficulty. Stance is normal. Gait demonstrates normal stride length and balance.  Difficulty raising on toes or going back on heels.  Tandem gait not attempted Reflexes: 1+ and symmetric. Toes downgoing.       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  . 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 Vascular risk factors of hypertension hyperlipidemia and  cerebrovascular disease.  Residual mild right hemiparesis with gradual worsening over the past couple months.     PLAN: -CT head to rule out acute infarct but likely worsening of  prior symptoms secondary to decreased activity and exercise -Referral home health PT/OT -Advised no driving at this time until strengthening and improve coordination of RLE -Repeat CTA head/neck for surveillance monitoring of known bilateral posterior communicating artery aneurysms -Continue aspirin 81 mg daily and atorvastatin for secondary stroke prevention -Continue to follow with PCP for HTN and HLD management -maintain strict control of hypertension with blood pressure goal below 130/90, diabetes with hemoglobin A1c goal below 6.5% and lipids with LDL cholesterol goal below 70 mg/dL. I also advised the patient to eat a healthy diet with plenty of whole grains, cereals, fruits and vegetables, exercise regularly and maintain ideal body weight .   Follow-up in 3 months or call earlier if needed  Greater than 50% time during this 25 minute visit was spent on counseling and coordination of care about her lacunar infarct, worsening of prior symptoms, history of mild cognitive impairment, indication for repeat imaging for surveillance monitoring of known small intracranial aneurysms and answered questions to patient and daughter satisfaction  Chloe Gray, AGNP-BC  [facility] [address] [place], [place] [address]  Phone [phone] Fax [phone] Note: This document was prepared with digital dictation and possible smart phrase technology. Any transcriptional errors that result from this process are unintentional.

## 2019-01-23 NOTE — Patient Instructions (Addendum)
Likely worsening of your prior stroke symptoms due to decreased activity but we will repeat a CT head to ensure there has not been any new stroke.  Referral placed to home health physical and Occupational Therapy  It will also be recommended to hold off on driving until improvement of your right leg weakness and coordination  We will also repeat imaging for monitoring of your aneurysms  Continue aspirin 81 mg daily  and Crestor  for secondary stroke prevention  Continue to follow up with PCP regarding cholesterol and blood pressure management   Continue to monitor blood pressure at home  Maintain strict control of hypertension with blood pressure goal below 130/90, diabetes with hemoglobin A1c goal below 6.5% and cholesterol with LDL cholesterol (bad cholesterol) goal below 70 mg/dL. I also advised the patient to eat a healthy diet with plenty of whole grains, cereals, fruits and vegetables, exercise regularly and maintain ideal body weight.  Followup in the future with me in 3 months or call earlier if needed       Thank you for coming to see [place] at [facility]. I hope we have been able to provide you high quality care today.  You may receive a patient satisfaction survey over the next few weeks. We would appreciate your feedback and comments so that we may continue to improve ourselves and the health of our patients.

## 2019-01-24 NOTE — Telephone Encounter (Signed)
I have faxed referral to the number below.

## 2019-01-24 NOTE — Progress Notes (Signed)
I agree with the above plan 

## 2019-01-30 ENCOUNTER — Ambulatory Visit
Admission: RE | Admit: 2019-01-30 | Discharge: 2019-01-30 | Disposition: A | Discharge status: Home / Self Care | Payer: Medicare Other | Source: Ambulatory Visit | Attending: Adult Health | Admitting: Adult Health

## 2019-01-30 ENCOUNTER — Other Ambulatory Visit: Payer: Self-pay

## 2019-01-30 ENCOUNTER — Telehealth: Payer: Self-pay | Admitting: Adult Health

## 2019-01-30 MED ORDER — IOPAMIDOL (ISOVUE-370) INJECTION 76%
75.0000 mL | Freq: Once | INTRAVENOUS | Status: AC | PRN
  Administered 2019-01-30: 11:00:00 75 mL via INTRAVENOUS

## 2019-01-30 NOTE — Telephone Encounter (Addendum)
I called and spoke to [place], PT.  I relayed that ok for PT once week for 9 wks at [facility].

## 2019-01-30 NOTE — Telephone Encounter (Signed)
Gretchen from [employer] called and left voicemail requesting Verbal Orders for Physical Therapy for the pt for 1 week 9  CB# [phone]

## 2019-02-01 ENCOUNTER — Telehealth: Payer: Self-pay

## 2019-02-01 NOTE — Telephone Encounter (Signed)
Called pt to go over her recent CT. Pt demonstrated understanding.  "Notes recorded by Frann Rider, NP on 01/30/2019 at 12:30 PM EST  Please advise patient that her recent imaging showed stable appearance of known aneurysm" - Per NP Frann Rider

## 2019-02-15 ENCOUNTER — Telehealth: Payer: Self-pay | Admitting: Adult Health

## 2019-02-15 NOTE — Telephone Encounter (Signed)
Cecil with [facility] called to inform patient has been taking ibprofen and that it has had a major interaction with her NTELLIX 20 MG TABS tablet and if patient should continue taking or if she should discontinue. Please follow up.

## 2019-02-15 NOTE — Telephone Encounter (Signed)
I called and LMVM for him to call me back.  We do not prescribe.

## 2019-02-15 NOTE — Telephone Encounter (Signed)
Ronalee Belts OT Therapist for [facility] called stating he just evaluated the pt and is needing VO for 1 time for 4 weeks. Please advise.

## 2019-02-15 NOTE — Telephone Encounter (Signed)
[facility] verbal order to proceed.

## 2019-02-22 ENCOUNTER — Telehealth: Payer: Self-pay | Admitting: *Deleted

## 2019-02-22 NOTE — Telephone Encounter (Signed)
Received orders from [place] for review and signature.  Also received 02-21-19 note stating pt had exposure to covid from daughter and is awaiting test results 02-21-19 prior to PT.  (pt refused). To JM/NP desk for review and signature.

## 2019-02-27 NOTE — Telephone Encounter (Signed)
Form has been signed by Frann Rider, NP and faxed back to [facility]. Confirmation fax has been received.

## 2019-03-06 ENCOUNTER — Telehealth: Payer: Self-pay | Admitting: Adult Health

## 2019-03-06 DIAGNOSIS — I517 Cardiomegaly: Secondary | ICD-10-CM

## 2019-03-06 DIAGNOSIS — R29898 Other symptoms and signs involving the musculoskeletal system: Secondary | ICD-10-CM | POA: Diagnosis not present

## 2019-03-06 DIAGNOSIS — R471 Dysarthria and anarthria: Secondary | ICD-10-CM | POA: Diagnosis not present

## 2019-03-06 DIAGNOSIS — R4781 Slurred speech: Secondary | ICD-10-CM | POA: Diagnosis not present

## 2019-03-06 NOTE — Telephone Encounter (Signed)
Pt is asking for a call from NP Janett Billow to discuss pt feeling that she may have had a very light stroke on Saturday.  Pt did not go to the ED and is unsure if she should go now.  Pt was encouraged to go if she feels like she should.  Please call

## 2019-03-06 NOTE — Telephone Encounter (Signed)
Spoke with the patient and she stated that on Saturday that she was having some issues getting her words out. She stated that the word finding difficulty didn't last long. Since Saturday she has not had any other issues. Please advise.

## 2019-03-06 NOTE — Telephone Encounter (Signed)
Unable to determine exact etiology of word finding difficulty.  Potentially due to new stroke or TIA.  Recent CTA head/neck largely unremarkable with stable appearance of known aneurysm.  Would recommend proceeding to ED for further evaluation if symptoms recur or any new stroke/TIA symptoms for further evaluation.  Please ensure she has continued all current medication regimens

## 2019-03-06 NOTE — Telephone Encounter (Signed)
Unable to get in contact with the patient. LVM letting her know what Jessica's instructions were. Office number was provided in case she has any other questions or concerns.     If patient calls back please let her know that Janett Billow recommends going to the ED to be evaluated if symptoms recur or any new stroke/TIA symptoms for further evaluation. And to make sure that she is taking her medication correctly.

## 2019-03-07 DIAGNOSIS — R471 Dysarthria and anarthria: Secondary | ICD-10-CM | POA: Diagnosis not present

## 2019-03-07 DIAGNOSIS — R29898 Other symptoms and signs involving the musculoskeletal system: Secondary | ICD-10-CM | POA: Diagnosis not present

## 2019-03-07 DIAGNOSIS — R4781 Slurred speech: Secondary | ICD-10-CM | POA: Diagnosis not present

## 2019-03-16 NOTE — Telephone Encounter (Signed)
Pt has called back and message from [place] was gone over with her.  Pt has asked to be able to come in before January, she accepted next available with NP Janett Billow for 12-21 with a 2:15 check in, this is FYI no call back requested.

## 2019-03-16 NOTE — Telephone Encounter (Signed)
Noted! Thank you

## 2019-03-20 ENCOUNTER — Ambulatory Visit: Payer: Medicare Other | Admitting: Adult Health

## 2019-03-20 ENCOUNTER — Encounter: Payer: Self-pay | Admitting: Adult Health

## 2019-03-20 ENCOUNTER — Telehealth: Payer: Self-pay

## 2019-03-20 ENCOUNTER — Other Ambulatory Visit: Payer: Self-pay

## 2019-03-20 VITALS — BP 116/72 | HR 77 | Temp 97.4°F | Ht 65.0 in | Wt 181.0 lb

## 2019-03-20 DIAGNOSIS — E785 Hyperlipidemia, unspecified: Secondary | ICD-10-CM

## 2019-03-20 DIAGNOSIS — G4733 Obstructive sleep apnea (adult) (pediatric): Secondary | ICD-10-CM | POA: Diagnosis not present

## 2019-03-20 DIAGNOSIS — I10 Essential (primary) hypertension: Secondary | ICD-10-CM

## 2019-03-20 DIAGNOSIS — Z8673 Personal history of transient ischemic attack (TIA), and cerebral infarction without residual deficits: Secondary | ICD-10-CM

## 2019-03-20 DIAGNOSIS — R7981 Abnormal blood-gas level: Secondary | ICD-10-CM | POA: Diagnosis not present

## 2019-03-20 DIAGNOSIS — I671 Cerebral aneurysm, nonruptured: Secondary | ICD-10-CM

## 2019-03-20 NOTE — Patient Instructions (Signed)
We will obtain records from [facility] for review of recent admission.  You will be called if any additional work-up needs to be completed  Referral will be placed to [facility] for further evaluation sleep apnea  Referral will be placed to pulmonology in regards to concerns of low oxygen levels -if you experience oxygen level less than 85 with symptoms including lightheadedness, fatigue, lethargy, confusion or dizziness please call 911 for further evaluation  Continue aspirin 81 mg daily and clopidogrel 75 mg daily  and Crestor for secondary stroke prevention  Continue aspirin Plavix for total of 3 weeks from recent stroke and then discontinue aspirin and continue Plavix alone  Continue to follow up with PCP regarding cholesterol and blood pressure management   Continue to work with home health physical and speech therapy  Continue to monitor blood pressure at home  Maintain strict control of hypertension with blood pressure goal below 130/90, diabetes with hemoglobin A1c goal below 6.5% and cholesterol with LDL cholesterol (bad cholesterol) goal below 70 mg/dL. I also advised the patient to eat a healthy diet with plenty of whole grains, cereals, fruits and vegetables, exercise regularly and maintain ideal body weight.        Thank you for coming to see [place] at [facility]. I hope we have been able to provide you high quality care today.  You may receive a patient satisfaction survey over the next few weeks. We would appreciate your feedback and comments so that we may continue to improve ourselves and the health of our patients.

## 2019-03-20 NOTE — Progress Notes (Signed)
[facility] [address] [place]. [place] [address] [phone]       FOLLOW UP NOTE  Chloe Gray Date of Birth:  03-27-1951 Medical Record Number:  [id number]    Reason for Referral:  Stroke  Chief Complaint  Patient presents with  . Follow-up    Tx RM, with daughter. states that she had another stroke december 7th. Having issues with CPAP Machine. Believes it may be O2 deprevation.     HPI: Initial visit 10/14/17 PS:  Ms Gray is a 53 year Caucasian lady seen today for first office consultation visit for stroke. She is accompanied by her daughter Asher Muir. History is obtained from them as well as review of medical records sent with her and have personally reviewed imaging films from [facility].. On 08/24/17 the patient went to [employer] for dinner with her husband and grandson. She was holding a cheeseburger and a drink in her hand and family noticed that she was about to drop it and appeared to be sleepy and unable to speak clearly. She was taken to [facility] where she was seen by attending neurologist. There was some mention in history about having some trouble the night before and patient's deficits were fluctuating and mild and TPA was not given. She subsequently was found to have right facial droop and some mild right-sided weakness. Eventually MRI was obtained which showed a left thalamic acute lacunar infarct. Patient did have a prior history of left brain stroke in 2016 at that time she had right approximately numbness and speech difficulties but she made a full recovery from that. CT angiogram of the brain and neck were obtained at [facility] showed only mild orthostatic changes. There were  small 2 x 3 mm aneurysm noted in posterior communicating arteries bilaterally. Patient states she's done well since discharge. She is still having some mild short-term memory difficulties and has trouble remembering recent information. She has been started  on aspirin and Plavix which is taken to the last 3 weeks. She is tolerating well without bruising or bleeding. She has not had an echocardiogram and it's unclear whether lipid profile and hemoglobin A 1C were checked. Patient does have family history of any examined one of her cousins who had a brain aneurysm requiring treatment. She denies any headaches, slurred speech or focal extremity weakness or any new neurological problems. Update 01/17/2018 PS: She returns for follow-up after last visit 3 months ago.  She is accompanied by her husband.  She is doing well.  She has had no recurrent TIA or stroke symptoms.  She is tolerating aspirin well with only minor bruising and no bleeding.  Her blood pressure is well controlled and today it is 120/76.  She remains on Crestor which she is tolerating well without muscle aches and pains.  She had transthoracic echocardiogram done on 10/21/2017 which showed normal ejection fraction without cardiac source of embolism.  Lab work on 10/14/2017 showed LDL cholesterol of 66 mg percent and hemoglobin A1c of 5.7.  She has no new complaints.  She states of mild short-term memory difficulties unchanged.  Update 01/23/2019: Chloe Gray is a 68 year old female who is being seen today for stroke follow-up accompanied by her daughter.  Prior residual stroke deficits of mild right-sided weakness which has been gradually worsening over the past couple months with greater difficulty regarding balance resulting in falls.  She does admit to decreased activity and exercise due to pandemic restrictions.  Daughter states when she is ambulating,  she will frequently during her right foot or trip on her right toe leading to a fall.  Denies numbness/tingling, speech difficulty, visual changes or mental status changes.  She did have repeat CTA in 01/2018 for surveillance monitoring of known aneurysm which showed stable appearance of prior imaging in 07/2017.  She has been doing well from a stroke  standpoint without reoccurring or new stroke/TIA symptoms.  Continues on aspirin and Crestor for secondary stroke prevention with mild bruising but no bleeding or myalgias.  Blood pressure today 127/80.  No further concerns at this time.  Update 03/20/2019: Chloe Gray is a 68 year old female who is being seen today per patient request due to recent stroke evaluated at [facility] on 03/06/2019 with concerns for word finding difficulty.  Requested paperwork from [facility] regarding recent admission but currently awaiting information.  Personally reviewed imaging through [facility] with MRI showing several scattered small acute infarcts in the posterior left MCA territory and a superimposed solitary small cortical infarcts in the posterior right PCA or MCA/PCA watershed area.  Per impression, felt as though acute ischemia more likely synchronous small vessel disease rather than indicating a recent embolic event.  MRA neck negative for large vessel occlusion.  She has continued to have expressive aphasia, left-sided weakness and cognitive impairment.  Currently receiving home health PT/ST and does endorse mild improvement.  Previously concern for worsening right-sided weakness but this has since improved.  She denies any recent falls and continues to ambulate without assistive device.  Continues to live with her husband but daughters rotate staying with her throughout the day.  Cognitive impairment consisting of delayed processing and difficulty following multiple step directions such as reading a recipe.  Daughter is concerned as she has been found to have lower oxygen saturation levels and can range from 87 to 91%.  At times, she is symptomatic with lower levels with lightheadedness and shortness of breath.  She apparently was seen by a pulmonologist in [place] but daughter requesting to be seen by a different pulmonologist along with possible need of oxygen.  She also endorses headache and increased  fatigue with headache typically present in the morning.  She has been previously diagnosed with sleep apnea with undergoing sleep study over 1 year ago by a sleep clinic in [place] and was provided a CPAP machine with patient and daughter both have a difficult time operating machine and endorses difficulty using.  She has not been able to adequately use machine.  During recent hospitalization, she was placed on DAPT for 3 weeks and then Plavix as previously on aspirin.  She was continued on Crestor.  Blood pressure today 116/72.  No further concerns at this time.  MR BRAIN WO CONTRAST MR ANGIO HEAD [facility] IMPRESSION: 1. Several scattered small acute infarcts in the posterior Left MCA territory, including an area of acute on chronic ischemia at the posterior left sylvian fissure. No associated hemorrhage or mass effect. 2. Superimposed solitary small cortical infarct in the posterior Right PCA or MCA/PCA watershed area. Given the absence of other acute ischemia this may be more likely synchronous small vessel disease rather than indicating a recent embolic event. 3. A few small scattered chronic cortical infarcts are identified, in addition to multiple chronic lacunar infarcts in the bilateral cerebellum and left thalamus. 4. Negative neck MRA. 5. Intracranial MRA is stable from the comparison CTAs, negative for large vessel occlusion and positive for a tiny right posterior communicating artery aneurysm or infundibulum.  ROS:   14 system review of systems is positive for weakness, speech difficulty and cognitive impairment and all other systems negative    PMH:  Past Medical History:  Diagnosis Date  . Anxiety   . Arthritis   . Asthma    PFT 12/12  PCP on chart  . Depression    states well controlled  . GERD (gastroesophageal reflux disease)   . H/O hiatal hernia   . Hypercholesterolemia   . Hypertension    clearance note with OV Dr Lorin PicketScott, EKG on chart  03/09/11  . Pneumonia   . Shortness of breath    with asthma  . Stroke (HCC)   . Vitamin B 12 deficiency     Social History:  Social History   Socioeconomic History  . Marital status: Married    Spouse name: Not on file  . Number of children: Not on file  . Years of education: Not on file  . Highest education level: Not on file  Occupational History  . Not on file  Tobacco Use  . Smoking status: Never Smoker  . Smokeless tobacco: Never Used  Substance and Sexual Activity  . Alcohol use: No  . Drug use: No  . Sexual activity: Not on file  Other Topics Concern  . Not on file  Social History Narrative  . Not on file   Social Determinants of Health   Financial Resource Strain:   . Difficulty of Paying Living Expenses: Not on file  Food Insecurity:   . Worried About Programme researcher, broadcasting/film/videounning Out of Food in the Last Year: Not on file  . Ran Out of Food in the Last Year: Not on file  Transportation Needs:   . Lack of Transportation (Medical): Not on file  . Lack of Transportation (Non-Medical): Not on file  Physical Activity:   . Days of Exercise per Week: Not on file  . Minutes of Exercise per Session: Not on file  Stress:   . Feeling of Stress : Not on file  Social Connections:   . Frequency of Communication with Friends and Family: Not on file  . Frequency of Social Gatherings with Friends and Family: Not on file  . Attends Religious Services: Not on file  . Active Member of Clubs or Organizations: Not on file  . Attends BankerClub or Organization Meetings: Not on file  . Marital Status: Not on file  Intimate Partner Violence:   . Fear of Current or Ex-Partner: Not on file  . Emotionally Abused: Not on file  . Physically Abused: Not on file  . Sexually Abused: Not on file    Medications:   Current Outpatient Medications on File Prior to Visit  Medication Sig Dispense Refill  . albuterol (PROVENTIL HFA;VENTOLIN HFA) 108 (90 BASE) MCG/ACT inhaler Inhale 2 puffs into the lungs every 6  (six) hours as needed. For shortness of breath.    Marland Kitchen. albuterol (PROVENTIL) (2.5 MG/3ML) 0.083% nebulizer solution Take 2.5 mg by nebulization every 6 (six) hours as needed. For shortness of breath.    . ALPRAZolam (XANAX) 1 MG tablet Take 1 mg by mouth 3 (three) times daily with meals.     Marland Kitchen. amLODipine (NORVASC) 10 MG tablet Take 10 mg by mouth daily.    Marland Kitchen. aspirin (ASPIRIN 81) 81 MG EC tablet Take 1 tablet (81 mg total) by mouth daily. 30 tablet 12  . buPROPion (WELLBUTRIN XL) 150 MG 24 hr tablet Take 150 mg by mouth daily.    . clopidogrel (  PLAVIX) 75 MG tablet Take 75 mg by mouth daily.    . cyanocobalamin (,VITAMIN B-12,) 1000 MCG/ML injection Inject 100 mcg into the muscle every 30 (thirty) days.    Marland Kitchen dicyclomine (BENTYL) 20 MG tablet Take 20 mg by mouth every 6 (six) hours.    . famotidine (PEPCID) 40 MG tablet Take 40 mg by mouth daily.    . furosemide (LASIX) 20 MG tablet TAKE ONE TABLET DAILY AS NEEDED FOR SWELLING OF LEGS  1  . lisinopril (ZESTRIL) 40 MG tablet Take 40 mg by mouth daily.    . montelukast (SINGULAIR) 10 MG tablet Take 10 mg by mouth daily.    . ranitidine (ZANTAC) 75 MG tablet Take 75 mg by mouth 2 (two) times daily.    . rosuvastatin (CRESTOR) 10 MG tablet Take by mouth.    . sodium bicarbonate 650 MG tablet Take 1,300 mg by mouth 2 (two) times daily.  3  . traZODone (DESYREL) 100 MG tablet Take 100 mg by mouth at bedtime.     No current facility-administered medications on file prior to visit.    Allergies:   Allergies  Allergen Reactions  . Latex Rash    Topical rash with peeling skin    Physical Exam  Today's Vitals   03/20/19 1433  BP: 116/72  Pulse: 77  Temp: (!) 97.4 F (36.3 C)  Weight: 181 lb (82.1 kg)  Height: 5\' 5"  (1.651 m)   Body mass index is 30.12 kg/m.   General: well developed, well nourished pleasant middle-age Caucasian lady, seated, in no evident distress Head: head normocephalic and atraumatic.   Neck: supple with no carotid  or supraclavicular bruits Cardiovascular: regular rate and rhythm, no murmurs Musculoskeletal: no deformity Skin:  no rash/petichiae Vascular:  Normal pulses all extremities  Neurologic Exam Mental Status: Awake and alert during visit but appears drowsy.  Delayed processing with mild to moderate expressive aphasia with mild dysarthria.  Oriented to place and time. Recent and remote memory impaired. Attention span, concentration and fund of knowledge appropriate. Mood and affect flat Recall 2/3, able to do serial additions, animal naming 11 in 60 seconds, clock drawing 3/4 Cranial Nerves:  Pupils equal, briskly reactive to light. Extraocular movements full without nystagmus. Visual fields full to confrontation. Hearing intact. Facial sensation intact.  Mild left lower facial weakness. tongue, palate moves normally and symmetrically.  Motor: Normal bulk and tone.  LUE: 4+/5 with positive pronator drift and decreased hand dexterity LLE: 4/5 hip flexor RUE: 5/5 RLE: 5/5  Sensory.:  Intact light touch, vibratory and pinprick sensation throughout Coordination: Rapid alternating movements diminished left hand. Finger-to-nose and heel-to-shin showed L>R ataxia. Gait and Station: Arises from chair without difficulty. Stance is normal. Gait demonstrates normal stride length and balance.  Difficulty raising on toes or going back on heels.  Tandem gait not attempted Reflexes: 1+ and symmetric. Toes downgoing.    IMPRESSION: 1. Several scattered small acute infarcts in the posterior Left MCA territory, including an area of acute on chronic ischemia at the posterior left sylvian fissure. No associated hemorrhage or mass effect. 2. Superimposed solitary small cortical infarct in the posterior Right PCA or MCA/PCA watershed area. Given the absence of other acute ischemia this may be more likely synchronous small vessel disease rather than indicating a recent embolic event. 3. A few small scattered  chronic cortical infarcts are identified, in addition to multiple chronic lacunar infarcts in the bilateral cerebellum and left thalamus. 4. Negative neck MRA. 5.  Intracranial MRA is stable from the comparison CTAs, negative for large vessel occlusion and positive for a tiny right posterior communicating artery aneurysm or infundibulum.    ASSESSMENT: 68 year old Caucasian lady with recent several scattered small infarcts on the left posterior MCA and small cortical infarct in the posterior right PCA or MCA/PCA watershed area evaluated at [facility] on 03/06/2019.  Currently awaiting additional information during admission but able to review imaging through [facility].  Prior history of left medial thalamic lacunar infarct in May 2019 from small vessel disease.  Concern for recent infarcts cardioembolic and may possibly need 30-day cardiac event monitoring to rule out atrial fibrillation.  Will await [facility] admission information for possible need of further testing and to ensure stroke work-up completed.  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 Vascular risk factors of hypertension hyperlipidemia cerebral aneurysms, OSA and cerebrovascular disease.  Residual deficits from recent stroke left hemiparesis, expressive aphasia and cognitive impairment     PLAN: -Requested information from [facility] from recent admission -May need additional work-up if indicated.  May need 30-day cardiac event monitor to rule out atrial fibrillation -Agree with 3-week DAPT which will be completed in 1 week and continue Plavix alone for secondary stroke prevention.  She will continue on Crestor as well for secondary stroke prevention. -Referral will be placed to [facility] sleep clinic with prior history of sleep apnea and difficulty tolerating machine -Referral will be placed to pulmonology due to recent onset of symptomatic low oxygen levels -discussion  with patient and daughter regarding proceeding to ED for further evaluation if oxygen levels less than 85 and symptomatic until further work-up completed by pulmonology -Continue to follow with PCP for HTN and HLD management -Continue to monitor blood pressure at home -maintain strict control of hypertension with blood pressure goal below 130/90, diabetes with hemoglobin A1c goal below 6.5% and lipids with LDL cholesterol goal below 70 mg/dL. I also advised the patient to eat a healthy diet with plenty of whole grains, cereals, fruits and vegetables, exercise regularly and maintain ideal body weight .   Follow-up in 3 months or call earlier if needed  Greater than 50% time during this 25 minute visit was spent on discussing recent stroke evaluated at [facility], counseling and coordination of care about her lacunar infarct, history of mild cognitive impairment, discussion regarding referral to [facility] sleep clinic due to importance of sleep apnea management, discussion regarding referral to pulmonology as requested by PCP and answered questions to patient and daughter satisfaction  Ihor Austin, [facility]  [facility] [address] [place], [place] [address]  Phone [phone] Fax [phone] Note: This document was prepared with digital dictation and possible smart phrase technology. Any transcriptional errors that result from this process are unintentional.

## 2019-03-20 NOTE — Telephone Encounter (Signed)
Please advise: Side Note- Pts daughter suggested that a Fax can be sent to [facility]-NP Lucita Lora. Regarding a f/u appt she had with the provider on 03/09/19.  [phone]

## 2019-03-20 NOTE — Telephone Encounter (Signed)
NP Frann Rider requested the pts recent hospital document notes.  (Pt was taken to hospital 03/06/19)  A representative gave me the Fax Number to [facility].  [phone]  Sent over the signed medical release form. Awaiting the information for the NP.  Received fax confirmation.  Stapled and place into medical records bin.

## 2019-03-21 NOTE — Progress Notes (Signed)
I agree with the above plan 

## 2019-04-25 ENCOUNTER — Ambulatory Visit: Payer: Medicare Other | Admitting: Adult Health

## 2019-05-09 ENCOUNTER — Institutional Professional Consult (permissible substitution): Payer: Medicare Other | Admitting: Pulmonary Disease

## 2019-06-05 ENCOUNTER — Institutional Professional Consult (permissible substitution): Payer: Medicare PPO | Admitting: Pulmonary Disease

## 2019-06-26 ENCOUNTER — Ambulatory Visit: Payer: Medicare Other | Admitting: Adult Health

## 2019-06-27 ENCOUNTER — Other Ambulatory Visit: Payer: Self-pay

## 2019-06-27 ENCOUNTER — Encounter: Payer: Self-pay | Admitting: Pulmonary Disease

## 2019-06-27 ENCOUNTER — Ambulatory Visit: Payer: Medicare Other | Admitting: Pulmonary Disease

## 2019-06-27 VITALS — BP 115/70 | HR 79 | Temp 97.6°F | Ht 65.0 in | Wt 183.4 lb

## 2019-06-27 DIAGNOSIS — J9611 Chronic respiratory failure with hypoxia: Secondary | ICD-10-CM | POA: Diagnosis not present

## 2019-06-27 DIAGNOSIS — G4733 Obstructive sleep apnea (adult) (pediatric): Secondary | ICD-10-CM | POA: Diagnosis not present

## 2019-06-27 DIAGNOSIS — I6310 Cerebral infarction due to embolism of unspecified precerebral artery: Secondary | ICD-10-CM | POA: Diagnosis not present

## 2019-06-27 NOTE — Patient Instructions (Addendum)
OSA  The natural history, progression and prognosis of sleep apnea, treatment with PAP and alternative treatment strategies were discussed. The patient was also educated regarding the long term cardiovascular benefits of treating sleep apnea, including improved blood pressure control, reduction in MI and stroke risk as well as other potential benefits of treatment, such as improved glycemic control, facilitation of weight loss, improved energy during the day and improved sleep quality. 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 Please wear oxygen to maintain sats >88%     Sleep Apnea Sleep apnea is a condition in which breathing pauses or becomes shallow during sleep. Episodes of sleep apnea usually last 10 seconds or longer, and they may occur as many as 20 times an hour. Sleep apnea disrupts your sleep and keeps your body from getting the rest that it needs. This condition can increase your risk of certain health problems, including:  Heart attack.  Stroke.  Obesity.  Diabetes.  Heart failure.  Irregular heartbeat. What are the causes? There are three kinds of sleep apnea:  Obstructive sleep apnea. This kind is caused by a blocked or collapsed airway.  Central sleep apnea. This kind happens when the part of the brain that controls breathing does not send the correct signals to the muscles that control breathing.  Mixed sleep apnea.  This is a combination of obstructive and central sleep apnea. The most common cause of this condition is a collapsed or blocked airway. An airway can collapse or become blocked if:  Your throat muscles are abnormally relaxed.  Your tongue and tonsils are larger than normal.  You are overweight.  Your airway is smaller than normal. What increases the risk? You are more likely to develop this condition if you:  Are overweight.  Smoke.  Have a smaller than normal airway.  Are elderly.  Are female.  Drink alcohol.  Take sedatives or tranquilizers.  Have a family history of sleep apnea. What are the signs or symptoms? Symptoms of this condition include:  Trouble staying asleep.  Daytime sleepiness and tiredness.  Irritability.  Loud snoring.  Morning headaches.  Trouble concentrating.  Forgetfulness.  Decreased interest in sex.  Unexplained sleepiness.  Mood swings.  Personality changes.  Feelings of depression.  Waking up often during the night to urinate.  Dry mouth.  Sore throat. How is this diagnosed? This condition may be diagnosed with:  A medical history.  A physical exam.  A series of tests that are done while you are sleeping (sleep study). These tests are usually done in a sleep lab, but they may also be done at home. How is this treated? Treatment for this condition aims to restore normal breathing and to ease symptoms during sleep. It may involve managing health issues that can affect breathing, such as high blood pressure or obesity. Treatment may include:  Sleeping on your side.  Using a decongestant if you have nasal congestion.  Avoiding the use of depressants, including alcohol, sedatives, and narcotics.  Losing weight if  you are overweight.  Making changes to your diet.  Quitting smoking.  Using a device to open your airway while you sleep, such as: ? An oral appliance. This is a custom-made mouthpiece that shifts your  lower jaw forward. ? A continuous positive airway pressure (CPAP) device. This device blows air through a mask when you breathe out (exhale). ? A nasal expiratory positive airway pressure (EPAP) device. This device has valves that you put into each nostril. ? A bi-level positive airway pressure (BPAP) device. This device blows air through a mask when you breathe in (inhale) and breathe out (exhale).  Having surgery if other treatments do not work. During surgery, excess tissue is removed to create a wider airway. It is important to get treatment for sleep apnea. Without treatment, this condition can lead to:  High blood pressure.  Coronary artery disease.  In men, an inability to achieve or maintain an erection (impotence).  Reduced thinking abilities. Follow these instructions at home: Lifestyle  Make any lifestyle changes that your health care provider recommends.  Eat a healthy, well-balanced diet.  Take steps to lose weight if you are overweight.  Avoid using depressants, including alcohol, sedatives, and narcotics.  Do not use any products that contain nicotine or tobacco, such as cigarettes, e-cigarettes, and chewing tobacco. If you need help quitting, ask your health care provider. General instructions  Take over-the-counter and prescription medicines only as told by your health care provider.  If you were given a device to open your airway while you sleep, use it only as told by your health care provider.  If you are having surgery, make sure to tell your health care provider you have sleep apnea. You may need to bring your device with you.  Keep all follow-up visits as told by your health care provider. This is important. Contact a health care provider if:  The device that you received to open your airway during sleep is uncomfortable or does not seem to be working.  Your symptoms do not improve.  Your symptoms get worse. Get help right away if:  You  develop: ? Chest pain. ? Shortness of breath. ? Discomfort in your back, arms, or stomach.  You have: ? Trouble speaking. ? Weakness on one side of your body. ? Drooping in your face. These symptoms may represent a serious problem that is an emergency. Do not wait to see if the symptoms will go away. Get medical help right away. Call your local emergency services (911 in the [place] Do not drive yourself to the hospital. Summary  Sleep apnea is a condition in which breathing pauses or becomes shallow during sleep.  The most common cause is a collapsed or blocked airway.  The goal of treatment is to restore normal breathing and to ease symptoms during sleep. This information is not intended to replace advice given to you by your health care provider. Make sure you discuss any questions you have with your health care provider. Document Revised: 08/31/2018 Document Reviewed: 11/09/2017 Elsevier Patient Education  2020 [employer].

## 2019-06-27 NOTE — Progress Notes (Signed)
Subjective:   PATIENT ID: Chloe Gray GENDER: female DOB: 07-19-1950, MRN: [id number]   HPI  Chief Complaint  Patient presents with  . Follow-up    oxygen dropping down into the 80's with exertions.    Reason for Visit: New consult for hypoxemia and asthma  [name] is a 69 year old former smoker with hx of embolic strokes, OSA non-adherent to CPAP, chronic hypoxemic respiratory failure who presents as a new consult. Her daughters are present.  She was seen by her PCP on 04/05/19. OSH records reviewed and reports hypoxemia while at her outpatient physical therapy session with nadir O2 to 83%. She is not on any maintenance inhalers. She is only on albuterol and montelukast. Both overnight O2 and ambulatory O2 demonstrates O2 need. PCP started patient on Breo 200-20 and nebulized albuterol and referred to Pulmonary.  In the last 3-4 years, she has episodes of bronchitis and pneumonia last year. Her daughter states that she has wheezing on exertion. The fall time is her worse time. Patient denies shortness of breath. However will use her rescue inhaler at least 1-2 times a day for wheezing and does report nocturnal symptoms 1-2 times within the last month. She has severe sleep apnea that was diagnosed one year ago however unable to tolerate nasal mask. She reports fatigue, nocturnal awakenings, morning headaches.   ACT 11  DME: [facility]  Social History: Never smoker  I have personally reviewed patient's past medical/family/social history, allergies, current medications.  Past Medical History:  Diagnosis Date  . Anxiety   . Arthritis   . Asthma    PFT 12/12  PCP on chart  . Depression    states well controlled  . GERD (gastroesophageal reflux disease)   . H/O hiatal hernia   . Hypercholesterolemia   . Hypertension    clearance note with OV Dr Lorin Picket, EKG on chart 03/09/11  . Pneumonia   . Shortness of breath    with asthma  . Sleep apnea   . Stroke  (HCC)   . Vitamin B 12 deficiency      Family History  Problem Relation Age of Onset  . Stroke Mother      Social History   Occupational History  . Not on file  Tobacco Use  . Smoking status: Never Smoker  . Smokeless tobacco: Never Used  Substance and Sexual Activity  . Alcohol use: No  . Drug use: No  . Sexual activity: Not on file    Allergies  Allergen Reactions  . Latex Rash    Topical rash with peeling skin     Outpatient Medications Prior to Visit  Medication Sig Dispense Refill  . albuterol (PROVENTIL HFA;VENTOLIN HFA) 108 (90 BASE) MCG/ACT inhaler Inhale 2 puffs into the lungs every 6 (six) hours as needed. For shortness of breath.    Marland Kitchen albuterol (PROVENTIL) (2.5 MG/3ML) 0.083% nebulizer solution Take 2.5 mg by nebulization every 6 (six) hours as needed. For shortness of breath.    . ALPRAZolam (XANAX) 1 MG tablet Take 1 mg by mouth 3 (three) times daily with meals.     Marland Kitchen amLODipine (NORVASC) 10 MG tablet Take 10 mg by mouth daily.    Marland Kitchen buPROPion (WELLBUTRIN XL) 150 MG 24 hr tablet Take 150 mg by mouth daily.    . clopidogrel (PLAVIX) 75 MG tablet Take 75 mg by mouth daily.    . cyanocobalamin (,VITAMIN B-12,) 1000 MCG/ML injection Inject 100 mcg into the muscle  every 30 (thirty) days.    . famotidine (PEPCID) 40 MG tablet Take 40 mg by mouth daily.    Marland Kitchen lisinopril (ZESTRIL) 40 MG tablet Take 40 mg by mouth daily.    . montelukast (SINGULAIR) 10 MG tablet Take 10 mg by mouth daily.    . rosuvastatin (CRESTOR) 10 MG tablet Take by mouth.    . sodium bicarbonate 650 MG tablet Take 1,300 mg by mouth 2 (two) times daily.  3  . traZODone (DESYREL) 100 MG tablet Take 100 mg by mouth at bedtime.    Marland Kitchen aspirin (ASPIRIN 81) 81 MG EC tablet Take 1 tablet (81 mg total) by mouth daily. (Patient not taking: Reported on 06/27/2019) 30 tablet 12  . dicyclomine (BENTYL) 20 MG tablet Take 20 mg by mouth every 6 (six) hours.    . furosemide (LASIX) 20 MG tablet TAKE ONE TABLET DAILY  AS NEEDED FOR SWELLING OF LEGS  1  . ranitidine (ZANTAC) 75 MG tablet Take 75 mg by mouth 2 (two) times daily.     No facility-administered medications prior to visit.    Review of Systems  Constitutional: Negative for chills, diaphoresis, fever, malaise/fatigue and weight loss.  HENT: Negative for congestion, ear pain and sore throat.   Respiratory: Positive for shortness of breath and wheezing. Negative for cough, hemoptysis and sputum production.   Cardiovascular: Negative for chest pain, palpitations and leg swelling.  Gastrointestinal: Negative for abdominal pain, heartburn and nausea.  Genitourinary: Negative for frequency.  Musculoskeletal: Negative for joint pain and myalgias.  Skin: Negative for itching and rash.  Neurological: Positive for headaches. Negative for dizziness and weakness.  Endo/Heme/Allergies: Does not bruise/bleed easily.  Psychiatric/Behavioral: Negative for depression. The patient is not nervous/anxious.      Objective:   Vitals:   06/27/19 1537  BP: 115/70  Pulse: 79  Temp: 97.6 F (36.4 C)  TempSrc: Temporal  SpO2: 95%  Weight: 183 lb 6.4 oz (83.2 kg)  Height: 5\' 5"  (1.651 m)   SpO2: 95 % O2 Device: None (Room air)  Physical Exam: General: Well-appearing, no acute distress HENT: [place], AT Eyes: EOMI, no scleral icterus Respiratory: Clear to auscultation bilaterally.  No crackles, wheezing or rales Cardiovascular: RRR, -M/R/G, no JVD GI: BS+, soft, nontender Extremities:-Edema,-tenderness Neuro: AAO x4, CNII-XII grossly intact Skin: Intact, no rashes or bruising Psych: Normal mood, normal affect  Data Reviewed:  Imaging: CXR 04/27/19-No acute findings. Negative for infiltrate, effusion or edema  PFT: 04/05/19 [facility] FVC 2.32  (72%) FEV1 1.85 (70%) Ratio 74 Interpretation: No obstructive defect  6 MWT  04/05/19 At rest on room air SpO2 94% On exertion on room air SpO2 87% On exertion 3L SpO2 90%    Assessment &  Plan:   Discussion: 69 year old female former smoker with hx of embolic strokes, OSA non-adherent to CPAP, chronic hypoxemic respiratory failure who presents as a new consult. She has complaints of wheezing and oxygen dependence.  OSA  The natural history, progression and prognosis of sleep apnea, treatment with PAP and alternative treatment strategies were discussed. The patient was also educated regarding the long term cardiovascular benefits of treating sleep apnea, including improved blood pressure control, reduction in MI and stroke risk as well as other potential benefits of treatment, such as improved glycemic control, facilitation of weight loss, improved energy during the day and improved sleep quality. --Counseled on sleep hygiene --Counseled on weight loss/maintenance of healthy weight --Counseled NOT to drive if/when sleepy --Family  will fax sleep study to our clinic.  Chronic hypoxemic respiratory failure CONTINUE 3L of supplemental oxygen for goal SpO2 >88% Will obtain pulmonary function tests to rule out obstructive or restrictive defect ADDENDUM: Reviewed PFTs with patient and daughter. No obstructive defect. Unable to complete lung volumes though FVC within normal limits.  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 Please wear oxygen to maintain sats >88%   Health Maintenance Immunization History  Administered Date(s) Administered  . Fluad Quad(high Dose 65+) 11/25/2018  . Influenza, High Dose Seasonal PF 12/16/2017  . PFIZER SARS-COV-2 Vaccination 05/02/2019, 05/23/2019  . Pneumococcal Conjugate-13 01/11/2018  . Zoster Recombinat (Shingrix) 12/09/2017, 10/19/2018   CT Lung Screen - not qualified  Orders Placed This Encounter  Procedures  . For home use only DME continuous positive airway pressure (CPAP)    CPAP  with full mask fitting. Patient uses [facility] in [place] (she had a CPAP machine and she sent it back)    Order Specific Question:   Length of Need    Answer:   Lifetime    Order Specific Question:   Patient has OSA or probable OSA    Answer:   Yes    Order Specific Question:   Settings    Answer:   Autotitration    Order Specific Question:   Signs and symptoms of probable OSA  (select all that apply)    Answer:   Snoring    Order Specific Question:   CPAP supplies needed    Answer:   Mask, headgear, cushions, filters, heated tubing and water chamber  . Pulmonary Function Test    First available    Standing Status:   Future    Number of Occurrences:   1    Standing Expiration Date:   06/26/2020    Order Specific Question:   Where should this test be performed?    Answer:   [facility]    Order Specific Question:   Full PFT: includes the following: basic spirometry, spirometry pre & post bronchodilator, diffusion capacity (DLCO), lung volumes    Answer:   Full PFT    Order Specific Question:   MIP/MEP    Answer:   No    Order Specific Question:   6 minute walk    Answer:   No    Order Specific Question:   ABG    Answer:   No    Order Specific Question:   Diffusion capacity (DLCO)    Answer:   Yes    Order Specific Question:   Lung volumes    Answer:   Yes    Order Specific Question:   Methacholine challenge    Answer:   No  No orders of the defined types were placed in this encounter.   Return in about 3 months (around 09/27/2019).  Demaryius Imran Mechele Collin, MD [facility] 06/27/2019 4:17 PM  Office Number [phone]

## 2019-07-03 NOTE — Telephone Encounter (Signed)
I called pt and relayed that J[facility]/NP did order a cardiac event monitor, she should hear from them soon.  She verbalized understanding.

## 2019-07-03 NOTE — Telephone Encounter (Signed)
Review of [facility] admission notes from stroke admission in 02/2019.  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  Unable to rule out embolic event due to bilateral nature of stroke and recommend pursuing 30-day cardiac event monitor to rule out atrial fibrillation.  This was previously discussed with patient.  Order will be placed to cardiology with request of cardiac monitoring.  Please advise patient.

## 2019-07-03 NOTE — Addendum Note (Signed)
Addended by: Raliegh Ip on: 07/03/2019 11:21 AM   Modules accepted: Orders

## 2019-07-04 ENCOUNTER — Telehealth: Payer: Self-pay | Admitting: Cardiology

## 2019-07-04 NOTE — Telephone Encounter (Signed)
I looled in pt chart to try and find out who called pt. Daughter said somebody called her on 07-03-19.

## 2019-07-05 ENCOUNTER — Telehealth: Payer: Self-pay | Admitting: Pulmonary Disease

## 2019-07-05 NOTE — Telephone Encounter (Signed)
Spoke with pt. Advised her that as long as we receive her COVID test prior to her breathing test on 4/9 it should be fine. Nothing further was needed.

## 2019-07-07 ENCOUNTER — Encounter: Payer: Medicare Other | Admitting: *Deleted

## 2019-07-07 ENCOUNTER — Other Ambulatory Visit: Payer: Self-pay

## 2019-07-07 DIAGNOSIS — J9611 Chronic respiratory failure with hypoxia: Secondary | ICD-10-CM

## 2019-07-07 LAB — PULMONARY FUNCTION TEST
DL/VA % pred: 97 %
DL/VA: 4.03 ml/min/mmHg/L
DLCO cor % pred: 76 %
DLCO cor: 15.36 ml/min/mmHg
DLCO unc % pred: 76 %
DLCO unc: 15.36 ml/min/mmHg
FEF 25-75 Post: 3.05 L/sec
FEF 25-75 Pre: 3.04 L/sec
FEF2575-%Change-Post: 0 %
FEF2575-%Pred-Post: 147 %
FEF2575-%Pred-Pre: 147 %
FEV1-%Change-Post: 2 %
FEV1-%Pred-Post: 96 %
FEV1-%Pred-Pre: 94 %
FEV1-Post: 2.32 L
FEV1-Pre: 2.27 L
FEV1FVC-%Change-Post: 0 %
FEV1FVC-%Pred-Pre: 115 %
FEV6-%Change-Post: 1 %
FEV6-%Pred-Post: 85 %
FEV6-%Pred-Pre: 84 %
FEV6-Post: 2.6 L
FEV6-Pre: 2.56 L
FEV6FVC-%Change-Post: 0 %
FEV6FVC-%Pred-Post: 103 %
FEV6FVC-%Pred-Pre: 103 %
FVC-%Change-Post: 2 %
FVC-%Pred-Post: 83 %
FVC-%Pred-Pre: 81 %
FVC-Post: 2.63 L
FVC-Pre: 2.58 L
Post FEV1/FVC ratio: 88 %
Post FEV6/FVC ratio: 99 %
Pre FEV1/FVC ratio: 88 %
Pre FEV6/FVC Ratio: 100 %

## 2019-07-11 ENCOUNTER — Encounter: Payer: Self-pay | Admitting: *Deleted

## 2019-07-11 ENCOUNTER — Other Ambulatory Visit: Payer: Self-pay

## 2019-07-11 ENCOUNTER — Ambulatory Visit: Payer: Medicare Other | Admitting: Cardiology

## 2019-07-11 VITALS — BP 110/80 | HR 91 | Ht 65.0 in | Wt 194.0 lb

## 2019-07-11 DIAGNOSIS — E782 Mixed hyperlipidemia: Secondary | ICD-10-CM

## 2019-07-11 DIAGNOSIS — I639 Cerebral infarction, unspecified: Secondary | ICD-10-CM | POA: Insufficient documentation

## 2019-07-11 DIAGNOSIS — I10 Essential (primary) hypertension: Secondary | ICD-10-CM | POA: Insufficient documentation

## 2019-07-11 DIAGNOSIS — G4733 Obstructive sleep apnea (adult) (pediatric): Secondary | ICD-10-CM

## 2019-07-11 HISTORY — DX: Essential (primary) hypertension: I10

## 2019-07-11 HISTORY — DX: Mixed hyperlipidemia: E78.2

## 2019-07-11 HISTORY — DX: Obstructive sleep apnea (adult) (pediatric): G47.33

## 2019-07-11 HISTORY — DX: Cerebral infarction, unspecified: I63.9

## 2019-07-11 NOTE — Progress Notes (Addendum)
Cardiology Office Note:    Date:  07/11/2019   ID:  Chloe Gray, DOB Aug 27, 1950, MRN [id number]  PCP:  Hal Morales, NP  Cardiologist:  Thomasene Ripple, DO  Electrophysiologist:  None   Referring MD: Ihor Austin, NP    The patient was referred by her PCP to be evaluated post CVA.  History of Present Illness:    Chloe Gray is a 69 y.o. female with a hx of hypertension, hyperlipidemia, multiple CVAs with the most recent CVA in December 2020 chronic diastolic heart failure.  She is here today with her daughter and she was asked to see cardiology.  Patient had a documented infarct back on her first stroke started but they are pretty sure that she has had different episodes cardiovascular accidents.  Patient has never been diagnosed with atrial fibrillation currently other cardiac arrhythmias.  There are still no clear etiology of her strokes.  She offers no complaints today states that she is at her baseline.  Denies any chest pain, shortness of breath, headaches, nausea or dizziness.  Past Medical History:  Diagnosis Date  . Anxiety   . Arthritis   . Asthma    PFT 12/12  PCP on chart  . Depression    states well controlled  . GERD (gastroesophageal reflux disease)   . H/O hiatal hernia   . Hypercholesterolemia   . Hypertension    clearance note with [facility] Dr Lorin Picket, EKG on chart 03/09/11  . Pneumonia   . Shortness of breath    with asthma  . Sleep apnea   . Stroke (HCC)   . Vitamin B 12 deficiency     Past Surgical History:  Procedure Laterality Date  . COLONOSCOPY    . KNEE ARTHROSCOPY    . TOTAL KNEE ARTHROPLASTY  04/30/2011   Procedure: TOTAL KNEE ARTHROPLASTY;  Surgeon: Javier Docker, MD;  Location: [facility];  Service: Orthopedics;  Laterality: Right;  Femoral nerve block  . TUBAL LIGATION    . UMBILICAL HERNIA REPAIR      Current Medications: Current Meds  Medication Sig  . albuterol (PROVENTIL HFA;VENTOLIN HFA) 108 (90 BASE) MCG/ACT inhaler Inhale 2 puffs into  the lungs every 6 (six) hours as needed. For shortness of breath.  Marland Kitchen albuterol (PROVENTIL) (2.5 MG/3ML) 0.083% nebulizer solution Take 2.5 mg by nebulization every 6 (six) hours as needed. For shortness of breath.  . ALPRAZolam (XANAX) 1 MG tablet Take 1 mg by mouth 3 (three) times daily with meals.   Marland Kitchen amLODipine (NORVASC) 10 MG tablet Take 10 mg by mouth daily.  Marland Kitchen buPROPion (WELLBUTRIN XL) 150 MG 24 hr tablet Take 150 mg by mouth daily.  . clopidogrel (PLAVIX) 75 MG tablet Take 75 mg by mouth daily.  . cyanocobalamin (,VITAMIN B-12,) 1000 MCG/ML injection Inject 100 mcg into the muscle every 30 (thirty) days.  . famotidine (PEPCID) 40 MG tablet Take 40 mg by mouth daily.  . furosemide (LASIX) 20 MG tablet TAKE ONE TABLET DAILY AS NEEDED FOR SWELLING OF LEGS  . lisinopril (ZESTRIL) 40 MG tablet Take 40 mg by mouth daily.  . montelukast (SINGULAIR) 10 MG tablet Take 10 mg by mouth daily.  . rosuvastatin (CRESTOR) 10 MG tablet Take by mouth.  . sodium bicarbonate 650 MG tablet Take 1,300 mg by mouth 2 (two) times daily.  . traZODone (DESYREL) 100 MG tablet Take 100 mg by mouth at bedtime as needed.   . [DISCONTINUED] dicyclomine (BENTYL) 20 MG tablet Take 20 mg by  mouth daily as needed.      Allergies:   Latex   Social History   Socioeconomic History  . Marital status: Married    Spouse name: Not on file  . Number of children: Not on file  . Years of education: Not on file  . Highest education level: Not on file  Occupational History  . Not on file  Tobacco Use  . Smoking status: Never Smoker  . Smokeless tobacco: Never Used  Substance and Sexual Activity  . Alcohol use: No  . Drug use: No  . Sexual activity: Not on file  Other Topics Concern  . Not on file  Social History Narrative  . Not on file   Social Determinants of Health   Financial Resource Strain:   . Difficulty of Paying Living Expenses:   Food Insecurity:   . Worried About Programme researcher, broadcasting/film/video in the Last Year:    . Barista in the Last Year:   Transportation Needs:   . Freight forwarder (Medical):   Marland Kitchen Lack of Transportation (Non-Medical):   Physical Activity:   . Days of Exercise per Week:   . Minutes of Exercise per Session:   Stress:   . Feeling of Stress :   Social Connections:   . Frequency of Communication with Friends and Family:   . Frequency of Social Gatherings with Friends and Family:   . Attends Religious Services:   . Active Member of Clubs or Organizations:   . Attends Banker Meetings:   Marland Kitchen Marital Status:      Family History: The patient's family history includes Hypertension in her brother, father, mother, sister, sister, and sister; Stroke in her mother.  ROS:   Review of Systems  Constitution: Negative for decreased appetite, fever and weight gain.  HENT: Negative for congestion, ear discharge, hoarse voice and sore throat.   Eyes: Negative for discharge, redness, vision loss in right eye and visual halos.  Cardiovascular: Negative for chest pain, dyspnea on exertion, leg swelling, orthopnea and palpitations.  Respiratory: Negative for cough, hemoptysis, shortness of breath and snoring.   Endocrine: Negative for heat intolerance and polyphagia.  Hematologic/Lymphatic: Negative for bleeding problem. Does not bruise/bleed easily.  Skin: Negative for flushing, nail changes, rash and suspicious lesions.  Musculoskeletal: Negative for arthritis, joint pain, muscle cramps, myalgias, neck pain and stiffness.  Gastrointestinal: Negative for abdominal pain, bowel incontinence, diarrhea and excessive appetite.  Genitourinary: Negative for decreased libido, genital sores and incomplete emptying.  Neurological: Negative for brief paralysis, focal weakness, headaches and loss of balance.  Psychiatric/Behavioral: Negative for altered mental status, depression and suicidal ideas.  Allergic/Immunologic: Negative for HIV exposure and persistent infections.     EKGs/Labs/Other Studies Reviewed:    The following studies were reviewed today:   EKG:  The ekg ordered today demonstrates sinus rhythm, heart rate 91 bpm, first-degree AV block with LVH criteria.  Left axis deviation with left anterior fascicular block, poor R wave progression suggesting old anterior infarction.  EKG done Aug 24, 2017 shows no significant change.  Head CT in December 2020 no emergent large vessel occlusion.  Stable infundibulum of the left posterior communicating artery.  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  Atherosclerotic calcification of the cavernous right internal carotid artery without significant stenosis or change.  Head/neck MRI MRA: There are scattered small acute infarcts in the posterior left MCA territory, including an area of acute on chronic ischemia at  the posterior left sylvian fissure.  No acute hemorrhage or mass-effect.  Superimposed solitary small cortical infarct in the posterior right PCA or MCA/PCA watershed area.  Given the absence of other acute ischemia this may be more likely synchronous small vessel disease rather than a recent embolic event.  A small scattered chronic cortical infarcts are identified, in addition to multiple chronic lacunar infarcts in the bilateral cerebellum and left thalamus.  Negative neck MRA.  Echocardiogram done on March 07, 2019: Mild concentric left ventricle hypertrophy.  There is normal global left ventricular contractility.  Overall left ventricular systolic function is normal with an EF of 55 to 60%.  The diastolic filling pattern indicates impaired relaxation.  No regional wall motion abnormalities were noted.  Right ventricle is normal in size and function.  Left atrium is normal in size and volume.  Right atrium is normal in size and function.  Interatrial septum and interventricular septum is intact.  Aortic valve is trileaflet with no stenosis or regurgitation.  Mitral valve is  normal.  No regurgitation.  Tricuspid valve has trace regurgitation.  RVSP 22 mmHg.  Pulmonary valve is normal no regurgitation.  Aortic root, ascending aorta and aortic arch appear normal.  Pericardium is normal.  There is no pericardial effusion.  Recent Labs: No results found for requested labs within last 8760 hours.  Recent Lipid Panel    Component Value Date/Time   CHOL 146 10/14/2017 1033   TRIG 134 10/14/2017 1033   HDL 53 10/14/2017 1033   CHOLHDL 2.8 10/14/2017 1033   LDLCALC 66 10/14/2017 1033    Physical Exam:    VS:  BP 110/80 (BP Location: Left Arm, Patient Position: Sitting, Cuff Size: Large)   Pulse 91   Ht 5\' 5"  (1.651 m)   Wt 194 lb (88 kg)   SpO2 93%   BMI 32.28 kg/m     Wt Readings from Last 3 Encounters:  07/11/19 194 lb (88 kg)  06/27/19 183 lb 6.4 oz (83.2 kg)  03/20/19 181 lb (82.1 kg)     GEN: Well nourished, well developed in no acute distress HEENT: Normal NECK: No JVD; No carotid bruits LYMPHATICS: No lymphadenopathy CARDIAC: S1S2 noted,RRR, no murmurs, rubs, gallops RESPIRATORY:  Clear to auscultation without rales, wheezing or rhonchi  ABDOMEN: Soft, non-tender, non-distended, +bowel sounds, no guarding. EXTREMITIES: No edema, No cyanosis, no clubbing MUSCULOSKELETAL:  No deformity  SKIN: Warm and dry NEUROLOGIC:  Alert and oriented x 3, non-focal PSYCHIATRIC:  Normal affect, good insight  ASSESSMENT:    1. Cerebrovascular accident (CVA), unspecified mechanism (HCC)   2. Essential hypertension   3. Mixed hyperlipidemia   4. OSA (obstructive sleep apnea)    PLAN:     She has had multiple shocks in the past the etiology is unclear.  I will like to rule out cardiac embolic source.  Therefore transesophageal echocardiogram is important rule out left atrial appendage clot and also an intra-atrial septum shunt.  Important as if the patient does have a left atrial appendage clot we will need to treat with anticoagulation as opposed to just  antiplatelets.  I educate the patient and her daughter about the TEE procedure. She denies any history of GI bleeding, Liver disease, no history to esophageal stricture or tear, no loose teeth. She denies any history of endoscopy.  She is agreeable to proceed with TEE.  In addition I am going to refer her to my EP colleagues  to be able to evaluate the patient for  possible loop recorder placement to understand if there is any arrhythmias as source of her stroke as well.  She will continue on her current treatment with Plavix as well as her milligrams daily.  Hypertension-her blood pressure susceptible to the office on her current antihypertensive regimen.  Hyperlipidemia continue patient on current Crestor 10 milligrams daily.  Recently diagnosed OSA - still waiting for her cpap.  Blood work today which includes BMP and CBC.   The patient is in agreement with the above plan. The patient left the office in stable condition.  The patient will follow up in 3 months or sooner if needed.   Medication Adjustments/Labs and Tests Ordered: Current medicines are reviewed at length with the patient today.  Concerns regarding medicines are outlined above.  Orders Placed This Encounter  Procedures  . Basic metabolic panel  . CBC  . Ambulatory referral to Cardiac Electrophysiology  . EKG 12-Lead   No orders of the defined types were placed in this encounter.   Patient Instructions  Medication Instructions:  Your physician recommends that you continue on your current medications as directed. Please refer to the Current Medication list given to you today.  *If you need a refill on your cardiac medications before your next appointment, please call your pharmacy*   Lab Work: TODAY:  BMET & CBC  If you have labs (blood work) drawn today and your tests are completely normal, you will receive your results only by: Marland Kitchen. MyChart Message (if you have MyChart) OR . A paper copy in the mail If you  have any lab test that is abnormal or we need to change your treatment, we will call you to review the results.   Testing/Procedures: Your physician has requested that you have a TEE. During a TEE, sound waves are used to create images of your heart. It provides your doctor with information about the size and shape of your heart and how well your heart's chambers and valves are working. In this test, a transducer is attached to the end of a flexible tube that's guided down your throat and into your esophagus (the tube leading from you mouth to your stomach) to get a more detailed image of your heart. You are not awake for the procedure. Please see the instruction sheet given to you today. For further information please visit [email or web address].        Follow-Up: At [facility], you and your health needs are our priority.  As part of our continuing mission to provide you with exceptional heart care, we have created designated Provider Care Teams.  These Care Teams include your primary Cardiologist (physician) and Advanced Practice Providers (APPs -  Physician Assistants and Nurse Practitioners) who all work together to provide you with the care you need, when you need it.  We recommend signing up for the patient portal called "MyChart".  Sign up information is provided on this After Visit Summary.  MyChart is used to connect with patients for Virtual Visits (Telemedicine).  Patients are able to view lab/test results, encounter notes, upcoming appointments, etc.  Non-urgent messages can be sent to your provider as well.   To learn more about what you can do with MyChart, go to [email or web address].    Your next appointment:   3 month(s)  The format for your next appointment:   In Person  Provider:   Thomasene RippleKardie Taiylor Virden, DO   Other Instructions      Adopting a Healthy Lifestyle.  Know what a healthy weight  is for you (roughly BMI <25) and aim to maintain this   Aim for 7+ servings of  fruits and vegetables daily   65-80+ fluid ounces of water or unsweet tea for healthy kidneys   Limit to max 1 drink of alcohol per day; avoid smoking/tobacco   Limit animal fats in diet for cholesterol and heart health - choose grass fed whenever available   Avoid highly processed foods, and foods high in saturated/trans fats   Aim for low stress - take time to unwind and care for your mental health   Aim for 150 min of moderate intensity exercise weekly for heart health, and weights twice weekly for bone health   Aim for 7-9 hours of sleep daily   When it comes to diets, agreement about the perfect plan isnt easy to find, even among the experts. Experts at the [place] [place] developed an idea known as the Healthy Eating Plate. Just imagine a plate divided into logical, healthy portions.   The emphasis is on diet quality:   Load up on vegetables and fruits - one-half of your plate: Aim for color and variety, and remember that potatoes dont count.   Go for whole grains - one-quarter of your plate: Whole wheat, barley, wheat berries, quinoa, oats, brown rice, and foods made with them. If you want pasta, go with whole wheat pasta.   Protein power - one-quarter of your plate: Fish, chicken, beans, and nuts are all healthy, versatile protein sources. Limit red meat.   The diet, however, does go beyond the plate, offering a few other suggestions.   Use healthy plant oils, such as olive, canola, soy, corn, sunflower and peanut. Check the labels, and avoid partially hydrogenated oil, which have unhealthy trans fats.   If youre thirsty, drink water. Coffee and tea are good in moderation, but skip sugary drinks and limit milk and dairy products to one or two daily servings.   The type of carbohydrate in the diet is more important than the amount. Some sources of carbohydrates, such as vegetables, fruits, whole grains, and beans-are healthier than others.   Finally, stay  active  Signed, Berniece Salines, DO  07/11/2019 9:15 PM    [facility]

## 2019-07-11 NOTE — Patient Instructions (Addendum)
Medication Instructions:  Your physician recommends that you continue on your current medications as directed. Please refer to the Current Medication list given to you today.  *If you need a refill on your cardiac medications before your next appointment, please call your pharmacy*   Lab Work: TODAY:  BMET & CBC  If you have labs (blood work) drawn today and your tests are completely normal, you will receive your results only by: Marland Kitchen MyChart Message (if you have MyChart) OR . A paper copy in the mail If you have any lab test that is abnormal or we need to change your treatment, we will call you to review the results.   Testing/Procedures: Your physician has requested that you have a TEE. During a TEE, sound waves are used to create images of your heart. It provides your doctor with information about the size and shape of your heart and how well your heart's chambers and valves are working. In this test, a transducer is attached to the end of a flexible tube that's guided down your throat and into your esophagus (the tube leading from you mouth to your stomach) to get a more detailed image of your heart. You are not awake for the procedure. Please see the instruction sheet given to you today. For further information please visit [email or web address]        Follow-Up: At [facility], you and your health needs are our priority.  As part of our continuing mission to provide you with exceptional heart care, we have created designated Provider Care Teams.  These Care Teams include your primary Cardiologist (physician) and Advanced Practice Providers (APPs -  Physician Assistants and Nurse Practitioners) who all work together to provide you with the care you need, when you need it.  We recommend signing up for the patient portal called "MyChart".  Sign up information is provided on this After Visit Summary.  MyChart is used to connect with patients for Virtual Visits (Telemedicine).  Patients  are able to view lab/test results, encounter notes, upcoming appointments, etc.  Non-urgent messages can be sent to your provider as well.   To learn more about what you can do with MyChart, go to [email or web address].    Your next appointment:   3 month(s)  The format for your next appointment:   In Person  Provider:   Thomasene Ripple, DO   Other Instructions

## 2019-07-12 ENCOUNTER — Telehealth: Payer: Self-pay | Admitting: Pulmonary Disease

## 2019-07-12 LAB — CBC
Hematocrit: 44.1 % (ref 34.0–46.6)
Hemoglobin: 14.8 g/dL (ref 11.1–15.9)
MCH: 33.2 pg — ABNORMAL HIGH (ref 26.6–33.0)
MCHC: 33.6 g/dL (ref 31.5–35.7)
MCV: 99 fL — ABNORMAL HIGH (ref 79–97)
Platelets: 300 10*3/uL (ref 150–450)
RBC: 4.46 x10E6/uL (ref 3.77–5.28)
RDW: 12.5 % (ref 11.7–15.4)
WBC: 8 10*3/uL (ref 3.4–10.8)

## 2019-07-12 LAB — BASIC METABOLIC PANEL
BUN/Creatinine Ratio: 11 — ABNORMAL LOW (ref 12–28)
BUN: 13 mg/dL (ref 8–27)
CO2: 21 mmol/L (ref 20–29)
Calcium: 10.3 mg/dL (ref 8.7–10.3)
Chloride: 101 mmol/L (ref 96–106)
Creatinine, Ser: 1.21 mg/dL — ABNORMAL HIGH (ref 0.57–1.00)
GFR calc Af Amer: 53 mL/min/{1.73_m2} — ABNORMAL LOW (ref >59)
GFR calc non Af Amer: 46 mL/min/{1.73_m2} — ABNORMAL LOW (ref >59)
Glucose: 102 mg/dL — ABNORMAL HIGH (ref 65–99)
Potassium: 4.4 mmol/L (ref 3.5–5.2)
Sodium: 134 mmol/L (ref 134–144)

## 2019-07-12 NOTE — Telephone Encounter (Signed)
Dr. Haynes Kerns please advise on PFT results.

## 2019-07-13 ENCOUNTER — Encounter: Payer: Self-pay | Admitting: *Deleted

## 2019-07-13 NOTE — Telephone Encounter (Signed)
Called patient and updated on PFT results. No changes in management. PRN bronchodilator for shortness of breath and wheezing.  Mechele Collin, M.D. [facility] 07/13/2019 2:07 PM

## 2019-07-24 ENCOUNTER — Telehealth: Payer: Self-pay | Admitting: Pulmonary Disease

## 2019-07-24 DIAGNOSIS — G4733 Obstructive sleep apnea (adult) (pediatric): Secondary | ICD-10-CM

## 2019-07-24 NOTE — Telephone Encounter (Signed)
Left message to call back  

## 2019-07-26 ENCOUNTER — Telehealth: Payer: Self-pay | Admitting: Cardiology

## 2019-07-26 NOTE — Telephone Encounter (Signed)
Spoke to the patient and let her know that we generally tell people that they will need about 4 hours for this procedure. She voices understanding and does not have any other issues or concerns.

## 2019-07-26 NOTE — Telephone Encounter (Signed)
Chloe Gray is calling wanting to know how long her TEE test that is scheduled for tomorrow will last. Please advise.

## 2019-07-26 NOTE — Telephone Encounter (Signed)
Called and spoke with daughter Delaney Meigs at [phone]. Looks like the order for CPAP was not put in. I called Dr. Eual Fines A. Chodri, MD office in [place] at [phone] to request sleep study be faxed over to our office. They should be faxing over sleep study from 2019.  Dr. Everardo All I am putting in the order for CPAP what would you like the settings to be or do we need to wait to get sleep study?

## 2019-07-26 NOTE — Telephone Encounter (Signed)
Patient is calling to follow up in regards to procedure scheduled for tomorrow, 07/27/19. She states she would like to inquire about how long she needs to hold clopidogrel (PLAVIX) 75 MG tablet medication. Please advise.

## 2019-07-26 NOTE — Telephone Encounter (Signed)
No she can take her plavix as usual. I called the patient already. Thank you.

## 2019-07-27 ENCOUNTER — Telehealth: Payer: Self-pay | Admitting: Cardiology

## 2019-07-27 ENCOUNTER — Telehealth: Payer: Self-pay | Admitting: Emergency Medicine

## 2019-07-27 ENCOUNTER — Other Ambulatory Visit: Payer: Self-pay | Admitting: Cardiology

## 2019-07-27 DIAGNOSIS — I6389 Other cerebral infarction: Secondary | ICD-10-CM | POA: Diagnosis not present

## 2019-07-27 DIAGNOSIS — Z79899 Other long term (current) drug therapy: Secondary | ICD-10-CM

## 2019-07-27 DIAGNOSIS — I639 Cerebral infarction, unspecified: Secondary | ICD-10-CM

## 2019-07-27 MED ORDER — APIXABAN 5 MG PO TABS
5.0000 mg | ORAL_TABLET | Freq: Two times a day (BID) | ORAL | 2 refills | Status: DC
Start: 2019-07-27 — End: 2019-10-13

## 2019-07-27 NOTE — Telephone Encounter (Signed)
I was able to speak to the patient and her daughter. She will get her labs tomorrow. She will stop aspirin and plavix for now. She will take eliquis 5 mg BID. Follow up on Tues May 4 at 10:40am. At which time we will discuss detail plan.

## 2019-07-27 NOTE — Telephone Encounter (Signed)
Per Dr. Servando Salina patient needs to have labs drawn tomorrow in our office. Orders have been placed.Patient also needs to be instructed to stop aspirin tomorrow and she can start eliquis 5 mg twice daily. Left message for patient to return call so I can go over this. Patient will also need a follow up appointment with Dr. Servando Salina in 2 weeks.

## 2019-07-28 ENCOUNTER — Telehealth: Payer: Self-pay | Admitting: Cardiology

## 2019-07-28 ENCOUNTER — Telehealth: Payer: Self-pay | Admitting: *Deleted

## 2019-07-28 ENCOUNTER — Other Ambulatory Visit: Payer: Self-pay | Admitting: Cardiology

## 2019-07-28 NOTE — Telephone Encounter (Signed)
Looked to see if the sleep study has been sent to our office. It has not. Will await report.

## 2019-07-28 NOTE — Addendum Note (Signed)
Addended by: Lita Mains on: 07/28/2019 10:52 AM   Modules accepted: Orders

## 2019-07-28 NOTE — Telephone Encounter (Signed)
No, we do have a 30-day monitor in your office have confirmed this.  We will be doing it for stroke

## 2019-07-28 NOTE — Telephone Encounter (Signed)
Dr. Servando Salina, pt is inquiring about a heart monitor, and when it will be ordered and placed.   There are no orders noted in the system for the pt to have a heart monitor.  Do you want to order her one, and if so, which type monitor and for what diagnosis? Pt will see you in clinic on 08/01/19. Please advise and thank you!

## 2019-07-28 NOTE — Telephone Encounter (Signed)
Gave pt 6 boxes of the Eliquis 2.5 mg because we are out of the 5 mg. Let pt know to take 2 twice daily.

## 2019-07-28 NOTE — Telephone Encounter (Signed)
   Pt said Dr. Servando Salina told her she needs to wear heart monito. She wanted to know when she going to receive it. No order yet on the active request   Please advise

## 2019-07-28 NOTE — Telephone Encounter (Signed)
Yes. If sleep study has recommendations for CPAP, please place order for supplies so patient can get started. If no recommendations for settings are on report, please schedule patient with NP to determine further action including sleep titration if indicated.  JE

## 2019-07-29 LAB — CBC WITH DIFFERENTIAL/PLATELET
Basophils Absolute: 0.1 10*3/uL (ref 0.0–0.2)
Basos: 2 %
EOS (ABSOLUTE): 0.3 10*3/uL (ref 0.0–0.4)
Eos: 3 %
Hematocrit: 40.9 % (ref 34.0–46.6)
Hemoglobin: 14.4 g/dL (ref 11.1–15.9)
Immature Grans (Abs): 0.1 10*3/uL (ref 0.0–0.1)
Immature Granulocytes: 1 %
Lymphocytes Absolute: 2 10*3/uL (ref 0.7–3.1)
Lymphs: 23 %
MCH: 33.4 pg — ABNORMAL HIGH (ref 26.6–33.0)
MCHC: 35.2 g/dL (ref 31.5–35.7)
MCV: 95 fL (ref 79–97)
Monocytes Absolute: 0.7 10*3/uL (ref 0.1–0.9)
Monocytes: 8 %
Neutrophils Absolute: 5.4 10*3/uL (ref 1.4–7.0)
Neutrophils: 63 %
Platelets: 311 10*3/uL (ref 150–450)
RBC: 4.31 x10E6/uL (ref 3.77–5.28)
RDW: 12.5 % (ref 11.7–15.4)
WBC: 8.6 10*3/uL (ref 3.4–10.8)

## 2019-07-31 ENCOUNTER — Other Ambulatory Visit: Payer: Self-pay

## 2019-07-31 NOTE — Telephone Encounter (Signed)
Left message for patient to return call.

## 2019-07-31 NOTE — Telephone Encounter (Signed)
Yes it will be. Thank you

## 2019-08-01 ENCOUNTER — Encounter: Payer: Self-pay | Admitting: Cardiology

## 2019-08-01 ENCOUNTER — Ambulatory Visit: Payer: Medicare Other | Admitting: Cardiology

## 2019-08-01 ENCOUNTER — Ambulatory Visit (INDEPENDENT_AMBULATORY_CARE_PROVIDER_SITE_OTHER): Payer: Medicare Other

## 2019-08-01 ENCOUNTER — Other Ambulatory Visit: Payer: Self-pay | Admitting: Cardiology

## 2019-08-01 ENCOUNTER — Telehealth: Payer: Self-pay | Admitting: Adult Health

## 2019-08-01 ENCOUNTER — Other Ambulatory Visit: Payer: Self-pay

## 2019-08-01 VITALS — BP 118/77 | HR 93 | Temp 97.6°F | Ht 65.0 in | Wt 180.0 lb

## 2019-08-01 DIAGNOSIS — I639 Cerebral infarction, unspecified: Secondary | ICD-10-CM

## 2019-08-01 DIAGNOSIS — I10 Essential (primary) hypertension: Secondary | ICD-10-CM

## 2019-08-01 DIAGNOSIS — E782 Mixed hyperlipidemia: Secondary | ICD-10-CM | POA: Diagnosis not present

## 2019-08-01 DIAGNOSIS — I513 Intracardiac thrombosis, not elsewhere classified: Secondary | ICD-10-CM

## 2019-08-01 DIAGNOSIS — R42 Dizziness and giddiness: Secondary | ICD-10-CM | POA: Diagnosis not present

## 2019-08-01 DIAGNOSIS — G4733 Obstructive sleep apnea (adult) (pediatric): Secondary | ICD-10-CM

## 2019-08-01 MED ORDER — METOPROLOL TARTRATE 100 MG PO TABS
100.0000 mg | ORAL_TABLET | Freq: Once | ORAL | 0 refills | Status: DC
Start: 2019-08-01 — End: 2019-09-25

## 2019-08-01 NOTE — Telephone Encounter (Signed)
Despite reason for recent stroke, it is still recommended that she has follow-up visit with our office at least for 1 or 2 additional visits but if she wishes to hold off at this time for follow-up, that will be her decision.  Thank you.

## 2019-08-01 NOTE — Telephone Encounter (Signed)
I called pt and LMVM for her that per JM/NP that due to recent stroke we would see 1-2 times after stroke, her (pt) decision on whether to do this.  I still have appt on schedule and she is to call if she will cancel the appt.

## 2019-08-01 NOTE — Patient Instructions (Signed)
Medication Instructions:  Your physician recommends that you continue on your current medications as directed. Please refer to the Current Medication list given to you today.  *If you need a refill on your cardiac medications before your next appointment, please call your pharmacy*   Lab Work: NONE If you have labs (blood work) drawn today and your tests are completely normal, you will receive your results only by: Marland Kitchen MyChart Message (if you have MyChart) OR . A paper copy in the mail If you have any lab test that is abnormal or we need to change your treatment, we will call you to review the results.   Testing/Procedures: Your physician has recommended that you wear an 30 day event monitor. Event monitors are medical devices that record the heart's electrical activity. Doctors most often [place] these monitors to diagnose arrhythmias. Arrhythmias are problems with the speed or rhythm of the heartbeat. The monitor is a small, portable device. You can wear one while you do your normal daily activities. This is usually used to diagnose what is causing palpitations/syncope (passing out).  Your physician has requested that you have cardiac CT. Cardiac computed tomography (CT) is a painless test that uses an x-ray machine to take clear, detailed pictures of your heart. For further information please visit [email or web address] Please follow instruction sheet as given.    Follow-Up: At [facility], you and your health needs are our priority.  As part of our continuing mission to provide you with exceptional heart care, we have created designated Provider Care Teams.  These Care Teams include your primary Cardiologist (physician) and Advanced Practice Providers (APPs -  Physician Assistants and Nurse Practitioners) who all work together to provide you with the care you need, when you need it.  We recommend signing up for the patient portal called "MyChart".  Sign up information is provided on this After  Visit Summary.  MyChart is used to connect with patients for Virtual Visits (Telemedicine).  Patients are able to view lab/test results, encounter notes, upcoming appointments, etc.  Non-urgent messages can be sent to your provider as well.   To learn more about what you can do with MyChart, go to [email or web address].    Your next appointment:   8 week(s)  The format for your next appointment:   In Person  Provider:   Thomasene Ripple, DO   Other Instructions  Your cardiac CT will be scheduled at one of the below locations:   [facility] [address] [place], [place] [address] [phone]  If scheduled at [facility], please arrive at the [facility] main entrance of [facility] 30 minutes prior to test start time. Proceed to the [facility] Radiology Department (first floor) to check-in and test prep.  Please follow these instructions carefully (unless otherwise directed):  On the Night Before the Test: . Be sure to Drink plenty of water. . Do not consume any caffeinated/decaffeinated beverages or chocolate 12 hours prior to your test. . Do not take any antihistamines 12 hours prior to your test.   On the Day of the Test: . Drink plenty of water. Do not drink any water within one hour of the test. . Do not eat any food 4 hours prior to the test. . You may take your regular medications prior to the test.  . Take metoprolol (Lopressor) two hours prior to test. . HOLD Furosemide/Hydrochlorothiazide morning of the test. . FEMALES- please wear underwire-free bra if available   Do  not give Lopressor to patients with an allergy to lopressor or anyone with asthma or active COPD symptoms (currently taking steroids).       After the Test: . Drink plenty of water. . After receiving IV contrast, you may experience a mild flushed feeling. This is normal. . On occasion, you may experience a mild rash up to 24 hours after the test. This is not  dangerous. If this occurs, you can take Benadryl 25 mg and increase your fluid intake. . If you experience trouble breathing, this can be serious. If it is severe call 911 IMMEDIATELY. If it is mild, please call our office. . If you take any of these medications: Glipizide/Metformin, Avandament, Glucavance, please do not take 48 hours after completing test unless otherwise instructed.   Once we have confirmed authorization from your insurance company, we will call you to set up a date and time for your test.   For non-scheduling related questions, please contact the cardiac imaging nurse navigator should you have any questions/concerns: Marchia Bond, RN Navigator Cardiac Imaging [facility] [phone] office  For scheduling needs, including cancellations and rescheduling, please call [phone].

## 2019-08-01 NOTE — Telephone Encounter (Signed)
Called and spoke with Dr Terrilee Croak office and they are going to fax this over  Will forward back to triage to keep an eye out for this and place in Dr [employer], thanks

## 2019-08-01 NOTE — Progress Notes (Signed)
Cardiology Office Note:    Date:  08/01/2019   ID:  [name], DOB 1951/02/29, MRN [id number]  PCP:  Hal Morales, NP  Cardiologist:  Thomasene Ripple, DO  Electrophysiologist:  None   Referring MD: Hal Morales, NP   The patient is here for a follow up visit.  History of Present Illness:    Chloe Gray is a 69 y.o. female with a hx of female with a hx of hypertension, hyperlipidemia, multiple CVAs with the most recent CVA in December 2020 chronic diastolic heart failure.  I did see the patient for the first time on July 11, 2019 at that time due to her strokes discuss ruling out cardiac embolic events.  We planned for a transesophageal echocardiogram and a loop recorder. In the meantime she was able to undergo her TEE which is suspicious for left atrial appendage clot and confirmed a 0.8cm patent foramen. The patient is here today to discuss plan for further testing and management.   She is present with her husband and daughter.   Past Medical History:  Diagnosis Date  . Anxiety   . Arthritis   . Asthma    PFT 12/12  PCP on chart  . Cerebral aneurysm without rupture 10/14/2017  . Cerebrovascular accident (CVA) (HCC) 07/11/2019  . Depression    states well controlled  . Essential hypertension 07/11/2019  . GERD (gastroesophageal reflux disease)   . H/O hiatal hernia   . Hypercholesterolemia   . Hypertension    clearance note with OV Dr Lorin Picket, EKG on chart 03/09/11  . MCI (mild cognitive impairment) 10/14/2017  . Mixed hyperlipidemia 07/11/2019  . OSA (obstructive sleep apnea) 07/11/2019  . Osteoarthritis of right knee 05/01/2011  . Pneumonia   . Shortness of breath    with asthma  . Sleep apnea   . Stroke (HCC)   . 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 Vitamin B 12 deficiency     Past Surgical History:  Procedure Laterality Date  . COLONOSCOPY    . KNEE ARTHROSCOPY    . TOTAL KNEE ARTHROPLASTY  04/30/2011   Procedure: TOTAL KNEE ARTHROPLASTY;  Surgeon: Javier Docker, MD;  Location: [facility];  Service: Orthopedics;  Laterality: Right;  Femoral nerve block  . TUBAL LIGATION    . UMBILICAL HERNIA REPAIR      Current Medications: Current Meds  Medication Sig  . albuterol (PROVENTIL HFA;VENTOLIN HFA) 108 (90 BASE) MCG/ACT inhaler Inhale 2 puffs into the lungs every 6 (six) hours as needed. For shortness of breath.  Marland Kitchen albuterol (PROVENTIL) (2.5 MG/3ML) 0.083% nebulizer solution Take 2.5 mg by nebulization every 6 (six) hours as needed. For shortness of breath.  . ALPRAZolam (XANAX) 1 MG tablet Take 1 mg by mouth 3 (three) times daily with meals.   Marland Kitchen amLODipine (NORVASC) 10 MG tablet Take 10 mg by mouth daily.  Marland Kitchen apixaban (ELIQUIS) 5 MG TABS tablet Take 1 tablet (5 mg total) by mouth 2 (two) times daily.  Marland Kitchen buPROPion (WELLBUTRIN XL) 150 MG 24 hr tablet Take 150 mg by mouth daily.  . citalopram (CELEXA) 10 MG tablet Take 30 mg by mouth at bedtime.  . cyanocobalamin (,VITAMIN B-12,) 1000 MCG/ML injection Inject 100 mcg into the muscle every 30 (thirty) days.  . famotidine (PEPCID) 40 MG tablet Take 40 mg by mouth daily.  . furosemide (LASIX) 20 MG tablet TAKE ONE TABLET DAILY AS NEEDED FOR SWELLING OF LEGS  . lisinopril (ZESTRIL) 40 MG tablet Take  40 mg by mouth daily.  . montelukast (SINGULAIR) 10 MG tablet Take 10 mg by mouth daily.  . rosuvastatin (CRESTOR) 10 MG tablet Take by mouth.  . sodium bicarbonate 650 MG tablet Take 1,300 mg by mouth 2 (two) times daily.  . traZODone (DESYREL) 100 MG tablet Take 100 mg by mouth at bedtime as needed.      Allergies:   Latex   Social History   Socioeconomic History  . Marital status: Married    Spouse name: Not on file  . Number of children: Not on file  . Years of education: Not on file  . Highest education level: Not on file  Occupational History  . Not on file  Tobacco Use  . Smoking status: Never Smoker  . Smokeless tobacco: Never Used  Substance and Sexual Activity  . Alcohol use: No  . Drug  use: No  . Sexual activity: Not on file  Other Topics Concern  . Not on file  Social History Narrative  . Not on file   Social Determinants of Health   Financial Resource Strain:   . Difficulty of Paying Living Expenses:   Food Insecurity:   . Worried About Charity fundraiser in the Last Year:   . Arboriculturist in the Last Year:   Transportation Needs:   . Film/video editor (Medical):   Marland Kitchen Lack of Transportation (Non-Medical):   Physical Activity:   . Days of Exercise per Week:   . Minutes of Exercise per Session:   Stress:   . Feeling of Stress :   Social Connections:   . Frequency of Communication with Friends and Family:   . Frequency of Social Gatherings with Friends and Family:   . Attends Religious Services:   . Active Member of Clubs or Organizations:   . Attends Archivist Meetings:   Marland Kitchen Marital Status:      Family History: The patient's family history includes Hypertension in her brother, father, mother, sister, sister, and sister; Stroke in her mother.  ROS:   Review of Systems  Constitution: Negative for decreased appetite, fever and weight gain.  HENT: Negative for congestion, ear discharge, hoarse voice and sore throat.   Eyes: Negative for discharge, redness, vision loss in right eye and visual halos.  Cardiovascular: Negative for chest pain, dyspnea on exertion, leg swelling, orthopnea and palpitations.  Respiratory: Negative for cough, hemoptysis, shortness of breath and snoring.   Endocrine: Negative for heat intolerance and polyphagia.  Hematologic/Lymphatic: Negative for bleeding problem. Does not bruise/bleed easily.  Skin: Negative for flushing, nail changes, rash and suspicious lesions.  Musculoskeletal: Negative for arthritis, joint pain, muscle cramps, myalgias, neck pain and stiffness.  Gastrointestinal: Negative for abdominal pain, bowel incontinence, diarrhea and excessive appetite.  Genitourinary: Negative for decreased libido,  genital sores and incomplete emptying.  Neurological: Negative for brief paralysis, focal weakness, headaches and loss of balance.  Psychiatric/Behavioral: Negative for altered mental status, depression and suicidal ideas.  Allergic/Immunologic: Negative for HIV exposure and persistent infections.    EKGs/Labs/Other Studies Reviewed:    The following studies were reviewed today:   EKG:  None today  Head CT in December 2020 no emergent large vessel occlusion.  Stable infundibulum of the left posterior communicating artery.  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  Atherosclerotic calcification of the cavernous right internal carotid artery without significant stenosis or change.  Head/neck MRI MRA: There are scattered small acute infarcts in  the posterior left MCA territory, including an area of acute on chronic ischemia at the posterior left sylvian fissure.  No acute hemorrhage or mass-effect.  Superimposed solitary small cortical infarct in the posterior right PCA or MCA/PCA watershed area.  Given the absence of other acute ischemia this may be more likely synchronous small vessel disease rather than a recent embolic event.  A small scattered chronic cortical infarcts are identified, in addition to multiple chronic lacunar infarcts in the bilateral cerebellum and left thalamus.  Negative neck MRA.  Echocardiogram done on March 07, 2019: Mild concentric left ventricle hypertrophy.  There is normal global left ventricular contractility.  Overall left ventricular systolic function is normal with an EF of 55 to 60%.  The diastolic filling pattern indicates impaired relaxation.  No regional wall motion abnormalities were noted.  Right ventricle is normal in size and function.  Left atrium is normal in size and volume.  Right atrium is normal in size and function.  Interatrial septum and interventricular septum is intact.  Aortic valve is trileaflet with no stenosis or  regurgitation.  Mitral valve is normal.  No regurgitation.  Tricuspid valve has trace regurgitation.  RVSP 22 mmHg.  Pulmonary valve is normal no regurgitation.  Aortic root, ascending aorta and aortic arch appear normal.  Pericardium is normal.  There is no pericardial effusion.  [facility] FINDINGS Procedure:A TEE was performed in the location listed above. I certify I was present in compliance with HCFA regulations.  The patient was monitored by a nurse in attendance throughout the procedure.   There were no TEE related complications.   The patient was under general anesthesia throughout the procedure. The probe was successfully passed by me. Study quality:This was a technically difficult study with suboptimal views. Left Ventricle:The left ventricle size is normal.   Overall left ventricular systolic function is normal with, an EF between 55 - 60 %. Left Atrium:The left atrium is normal in size and function.   The is a small mobile mass in the left atrial appendage ( clip 14 and 15, at 84 degrees) highly suspicious for a clot. Right Ventricle:The right ventricle is normal in size and function, but not well visualized. Right Atrium:The right atrium was not well visualized.    There is a 0.8cm patent foramen ovale noted in the interatrial septum with positive color flow. Agitated saline study was positive. Aortic Valve:The aortic valve is trileaflet, and appears structurally normal. No aortic stenosis or regurgitation.   The aortic valve is trileaflet and appears structurally normal.   There is no evidence of aortic regurgitation.   There is no evidence of aortic stenosis. Mitral Valve:The mitral valve is normal.   There is trace mitral regurgitation. Tricuspid Valve:The tricuspid valve appears structurally normal. Pulmonic Valve:The pulmonic valve is normal.   There is no pulmonic regurgitation present. Aorta:Non complex (< 4mm), atherosclerotic plaque(s) located in the ascending  and descending aorta. Pulmonary Artery:The pulmonic artery is not well visualized. Pulmonary Veins:Unable to  visualized the right pulmonary vein. The left pulmonary vein upper and lower not well visualized. Pericardium:There is no pericardial effusion.  CONCLUSIONS ----------- 1. This was a technically difficult study with suboptimal views. 2. The left ventricle size is normal. 3. Overall left ventricular systolic function is normal with, an EF between 55 - 60 %. 4. The is a small mobile mass in the left atrial appendage ( clip 14 and 15, at 84 degrees) highly suspicious for a clot. 5. The right  ventricle is normal in size and function, but not well visualized. 6. The right atrium was not well visualized. 7. There is a 0.8cm patent foramen ovale noted in the interatrial septum with positive color flow. Agitated saline study was positive. 8. The aortic valve is trileaflet and appears structurally normal. 9. There is no evidence of aortic regurgitation. 10. There is no evidence of aortic stenosis. 11. The mitral valve is normal. 12. There is trace mitral regurgitation. 13. The tricuspid valve appears structurally normal. 14. The pulmonic valve is normal. 15. There is no pulmonic regurgitation present. 16. Non complex (< 50mm), atherosclerotic plaque(s) located in the ascending and descending aorta. 17. The pulmonic artery is normal. 18. The pulmonary veins were not recorded. 19. There is no pericardial effusion.  Electronically Signed By:  Thomasene Ripple, DO Electronically Signed On: 2019/07/27 15:04:29   Recent Labs: 07/11/2019: BUN 13; Creatinine, Ser 1.21; Potassium 4.4; Sodium 134 07/28/2019: Hemoglobin 14.4; Platelets 311  Recent Lipid Panel    Component Value Date/Time   CHOL 146 10/14/2017 1033   TRIG 134 10/14/2017 1033   HDL 53 10/14/2017 1033   CHOLHDL 2.8 10/14/2017 1033   LDLCALC 66 10/14/2017 1033    Physical Exam:    VS:  BP 118/77   Pulse 93   Temp 97.6 F  (36.4 C)   Ht 5\' 5"  (1.651 m)   Wt 180 lb (81.6 kg)   SpO2 92%   BMI 29.95 kg/m     Wt Readings from Last 3 Encounters:  08/01/19 180 lb (81.6 kg)  07/11/19 194 lb (88 kg)  06/27/19 183 lb 6.4 oz (83.2 kg)     GEN: Well nourished, well developed in no acute distress HEENT: Normal NECK: No JVD; No carotid bruits LYMPHATICS: No lymphadenopathy CARDIAC: S1S2 noted,RRR, no murmurs, rubs, gallops RESPIRATORY:  Clear to auscultation without rales, wheezing or rhonchi  ABDOMEN: Soft, non-tender, non-distended, +bowel sounds, no guarding. EXTREMITIES: No edema, No cyanosis, no clubbing MUSCULOSKELETAL:  No deformity  SKIN: Warm and dry NEUROLOGIC:  Alert and oriented x 3, non-focal PSYCHIATRIC:  Normal affect, good insight  ASSESSMENT:    1. Cryptogenic stroke (HCC)   2. Thrombus of left atrial appendage   3. Essential hypertension   4. Mixed hyperlipidemia   5. OSA (obstructive sleep apnea)    PLAN:    The patient is status post transesophageal echocardiogram by me on July 27, 2019.  I do have some suspicion that there may be a left atrial appendage clot though is only in a few images.  Is important for this to be confirmed given the patient history of cryptogenic stroke.  For now I started her on Eliquis and stop the antiplatelets.  But I really would like her to get a cardiac CT pulmonary vein morphology study to confirm or rule out left atrial appendage clot.  I explained this to the patient and her husband and her daughter in great length.  In addition, I am going to place a 30-day monitor on the patient for now.  If this is negative we still will need evaluation for a loop recorder.  A PFO was also noted on the study.  For now I would like to rule out atrial fibrillation as well as left atrial appendage clot prior to her being evaluated by our structural team this.  Hypertension-blood pressures acceptable in the office today.  Hyperlipidemia-continue patient on her current  statin dose.  Blood work for anticoagulant will work-up is still pending  The patient is in agreement with the above plan. The patient left the office in stable condition.  The patient will follow up in   Medication Adjustments/Labs and Tests Ordered: Current medicines are reviewed at length with the patient today.  Concerns regarding medicines are outlined above.  Orders Placed This Encounter  Procedures  . CT CARDIAC MORPH/PULM VEIN W/CM&W/O CA SCORE  . CT CORONARY FRACTIONAL FLOW RESERVE DATA PREP  . CT CORONARY FRACTIONAL FLOW RESERVE FLUID ANALYSIS  . CARDIAC EVENT MONITOR   Meds ordered this encounter  Medications  . metoprolol tartrate (LOPRESSOR) 100 MG tablet    Sig: Take 1 tablet (100 mg total) by mouth once for 1 dose.    Dispense:  1 tablet    Refill:  0    Patient Instructions  Medication Instructions:  Your physician recommends that you continue on your current medications as directed. Please refer to the Current Medication list given to you today.  *If you need a refill on your cardiac medications before your next appointment, please call your pharmacy*   Lab Work: NONE If you have labs (blood work) drawn today and your tests are completely normal, you will receive your results only by: Marland Kitchen MyChart Message (if you have MyChart) OR . A paper copy in the mail If you have any lab test that is abnormal or we need to change your treatment, we will call you to review the results.   Testing/Procedures: Your physician has recommended that you wear an 30 day event monitor. Event monitors are medical devices that record the heart's electrical activity. Doctors most often [place] these monitors to diagnose arrhythmias. Arrhythmias are problems with the speed or rhythm of the heartbeat. The monitor is a small, portable device. You can wear one while you do your normal daily activities. This is usually used to diagnose what is causing palpitations/syncope (passing out).  Your  physician has requested that you have cardiac CT. Cardiac computed tomography (CT) is a painless test that uses an x-ray machine to take clear, detailed pictures of your heart. For further information please visit [email or web address] Please follow instruction sheet as given.    Follow-Up: At [facility], you and your health needs are our priority.  As part of our continuing mission to provide you with exceptional heart care, we have created designated Provider Care Teams.  These Care Teams include your primary Cardiologist (physician) and Advanced Practice Providers (APPs -  Physician Assistants and Nurse Practitioners) who all work together to provide you with the care you need, when you need it.  We recommend signing up for the patient portal called "MyChart".  Sign up information is provided on this After Visit Summary.  MyChart is used to connect with patients for Virtual Visits (Telemedicine).  Patients are able to view lab/test results, encounter notes, upcoming appointments, etc.  Non-urgent messages can be sent to your provider as well.   To learn more about what you can do with MyChart, go to [email or web address].    Your next appointment:   8 week(s)  The format for your next appointment:   In Person  Provider:   Thomasene Ripple, DO   Other Instructions  Your cardiac CT will be scheduled at one of the below locations:   [facility] [address] [place], [place] [address] [phone]  If scheduled at [facility], please arrive at the [address] [facility] main entrance of [facility] 30 minutes prior to test start time. Proceed to  the [facility] Radiology Department (first floor) to check-in and test prep.  Please follow these instructions carefully (unless otherwise directed):  On the Night Before the Test: . Be sure to Drink plenty of water. . Do not consume any caffeinated/decaffeinated beverages or chocolate 12 hours prior to your test. . Do  not take any antihistamines 12 hours prior to your test.   On the Day of the Test: . Drink plenty of water. Do not drink any water within one hour of the test. . Do not eat any food 4 hours prior to the test. . You may take your regular medications prior to the test.  . Take metoprolol (Lopressor) two hours prior to test. . HOLD Furosemide/Hydrochlorothiazide morning of the test. . FEMALES- please wear underwire-free bra if available   Do not give Lopressor to patients with an allergy to lopressor or anyone with asthma or active COPD symptoms (currently taking steroids).       After the Test: . Drink plenty of water. . After receiving IV contrast, you may experience a mild flushed feeling. This is normal. . On occasion, you may experience a mild rash up to 24 hours after the test. This is not dangerous. If this occurs, you can take Benadryl 25 mg and increase your fluid intake. . If you experience trouble breathing, this can be serious. If it is severe call 911 IMMEDIATELY. If it is mild, please call our office. . If you take any of these medications: Glipizide/Metformin, Avandament, Glucavance, please do not take 48 hours after completing test unless otherwise instructed.   Once we have confirmed authorization from your insurance company, we will call you to set up a date and time for your test.   For non-scheduling related questions, please contact the cardiac imaging nurse navigator should you have any questions/concerns: Rockwell Alexandria, RN Navigator Cardiac Imaging [facility] [phone] office  For scheduling needs, including cancellations and rescheduling, please call [phone].        Adopting a Healthy Lifestyle.  Know what a healthy weight is for you (roughly BMI <25) and aim to maintain this   Aim for 7+ servings of fruits and vegetables daily   65-80+ fluid ounces of water or unsweet tea for healthy kidneys   Limit to max 1 drink of  alcohol per day; avoid smoking/tobacco   Limit animal fats in diet for cholesterol and heart health - choose grass fed whenever available   Avoid highly processed foods, and foods high in saturated/trans fats   Aim for low stress - take time to unwind and care for your mental health   Aim for 150 min of moderate intensity exercise weekly for heart health, and weights twice weekly for bone health   Aim for 7-9 hours of sleep daily   When it comes to diets, agreement about the perfect plan isnt easy to find, even among the experts. Experts at the [facility] [employer] developed an idea known as the Healthy Eating Plate. Just imagine a plate divided into logical, healthy portions.   The emphasis is on diet quality:   Load up on vegetables and fruits - one-half of your plate: Aim for color and variety, and remember that potatoes dont count.   Go for whole grains - one-quarter of your plate: Whole wheat, barley, wheat berries, quinoa, oats, brown rice, and foods made with them. If you want pasta, go with whole wheat pasta.   Protein power - one-quarter  of your plate: Fish, chicken, beans, and nuts are all healthy, versatile protein sources. Limit red meat.   The diet, however, does go beyond the plate, offering a few other suggestions.   Use healthy plant oils, such as olive, canola, soy, corn, sunflower and peanut. Check the labels, and avoid partially hydrogenated oil, which have unhealthy trans fats.   If youre thirsty, drink water. Coffee and tea are good in moderation, but skip sugary drinks and limit milk and dairy products to one or two daily servings.   The type of carbohydrate in the diet is more important than the amount. Some sources of carbohydrates, such as vegetables, fruits, whole grains, and beans-are healthier than others.   Finally, stay active  Signed, Thomasene Ripple, DO  08/01/2019 12:03 PM     [facility]

## 2019-08-01 NOTE — Telephone Encounter (Signed)
I called patient back and LVM to advise patient of NP's message. Requested patient call back to let us know her decision.

## 2019-08-01 NOTE — Telephone Encounter (Signed)
I called patient today to give a reminder of 5/6 appointment with NP. She states she has been seeing a cardiologist and has been told that she has a hole in her heart, which may be the cause of her stroke. She was also told that there may be a possible blood clot. She is unsure if she needs to continue following up here or let her cardiologist take over. Please advise.

## 2019-08-02 NOTE — Telephone Encounter (Signed)
Looked in [employer] in C Pod. Did not see a sleep study. Will leave encounter open for follow up on fax.

## 2019-08-03 ENCOUNTER — Ambulatory Visit: Payer: Medicare Other | Admitting: Adult Health

## 2019-08-03 LAB — PROTEIN S PANEL
Protein S Activity: 126 % (ref 63–140)
Protein S Ag, Free: 180 % — ABNORMAL HIGH (ref 57–157)
Protein S Ag, Total: 87 % (ref 60–150)

## 2019-08-03 LAB — FACTOR 5 LEIDEN

## 2019-08-03 NOTE — Telephone Encounter (Signed)
I called Dr. Eual Fines A. Chodri, MD office in [place] at [phone] to request sleep study be faxed over to our office. Had to leave a message. I also called daughter Delaney Meigs and gave her an update. Will try to call office in [place] again to follow up. Order is pending waiting to receive sleep study first.

## 2019-08-03 NOTE — Telephone Encounter (Signed)
Joni daughter checking on status of CPAP order. Joni phone number is [phone].

## 2019-08-08 NOTE — Telephone Encounter (Signed)
To my knowledge we have not received sleep study yet. Called sleep center at [phone] again to try and obtain sleep study. Had to leave a voicemail and I asked them to please put ATTN: [name] or Dr. Everardo All.

## 2019-08-09 ENCOUNTER — Telehealth: Payer: Self-pay | Admitting: Pulmonary Disease

## 2019-08-09 NOTE — Telephone Encounter (Signed)
Found sleep study in JE's inbox in C Pod.   Per the report, patient's CPAP titration determined that she needed a pressure setting of 15cm.   Will leave report in C Pod.   Left message for patient's daughter to call back.

## 2019-08-09 NOTE — Telephone Encounter (Signed)
Duplicate message. Please see other encounter from today.  

## 2019-08-10 ENCOUNTER — Telehealth: Payer: Self-pay | Admitting: Pulmonary Disease

## 2019-08-10 NOTE — Telephone Encounter (Signed)
08/10/2019  Difficult to fully say as I have not seen the patient.  The CPAP titration is from almost 2 years ago.  A lot has probably logistically change for the patient over the last 2 years.  I would recommend the patient establish with one of our sleep MDs.  If the patient would like to start CPAP therapy prior to seeing a sleep MD in our office that is perfectly fine.  Okay to place order for CPAP start, CPAP set pressure 15, supplies, mask of choice.  Please schedule a 63-month consult with sleep md to check compliance and to establish care with a Sleep MD in clinic.  At this appointment patient can also demonstrate 30 day compliance to CPAP therapy.  Elisha Headland, FNP

## 2019-08-10 NOTE — Telephone Encounter (Signed)
Found fax, study states under impression   09/21/17- "This study shows a CPAP titration to optimal pressure of 15cm/H2O with AHI of 0/hr"   Please advise what needs to be ordered from sleep study report from 09/21/2017

## 2019-08-10 NOTE — Telephone Encounter (Signed)
Spoke with Chloe Gray. She is aware of Chloe Gray's recommendations. Will go ahead and place order for CPAP machine. Per Chloe Gray, she had used [facility] before in the past and wants the order to be sent there.   Advised her I would go ahead and place the order.   2 month recall has been placed for the sleep consult in July.   Nothing further needed at time of call.

## 2019-08-10 NOTE — Telephone Encounter (Signed)
Please see encounter from 5/12 as this is a duplicate.

## 2019-08-10 NOTE — Telephone Encounter (Signed)
Lauren can you possibly look in Dr. Henderson Cloud mail to see if we have received the sleep study report. If we have per Dr. Everardo All If sleep study has recommendations for CPAP, please place order for supplies so patient can get started. If no recommendations for settings are on report, please schedule patient with NP to determine further action including sleep titration if indicated.  Thank you

## 2019-08-10 NOTE — Telephone Encounter (Signed)
Attempted to call pt's daughter Delaney Meigs but line went straight to VM. Left message for her to return call.

## 2019-08-10 NOTE — Telephone Encounter (Signed)
Chloe Gray daughter is returning phone call. Chloe Gray phone number is [phone].

## 2019-08-10 NOTE — Telephone Encounter (Signed)
lmtcb for daughter Delaney Meigs. Call went straight to VM.

## 2019-08-11 LAB — ANTIPHOSPHOLIPID SYNDROME COMP
APTT: 26.6 s
Anticardiolipin Ab, IgA: <10 [APL'U]
Anticardiolipin Ab, IgG: <10 [GPL'U]
Anticardiolipin Ab, IgM: <10 [MPL'U]
Antiphosphatidylserine IgG: 0 {GPS'U}
Antiphosphatidylserine IgM: 0 {MPS'U}
Antiprothrombin Antibody, IgG: 15 G units
Beta-2 Glycoprotein I, IgA: <10 SAU
Beta-2 Glycoprotein I, IgG: <10 SGU
Beta-2 Glycoprotein I, IgM: <10 SMU
DRVVT Screen Seconds: 38.7 s
Hexagonal Phospholipid Neutral: 0 s
Platelet Neutralization: 0 s

## 2019-08-11 NOTE — Telephone Encounter (Signed)
Lauren please advise on this message. Looks like we received sleep study and settings need to be changed to 15cm and how we can go about getting order signed off.

## 2019-08-12 NOTE — Telephone Encounter (Signed)
Agree.  Steffanie Dunn, DO 08/12/19 7:09 AM [facility]

## 2019-08-14 NOTE — Telephone Encounter (Signed)
This message has been sent to Dr. Craige Cotta for follow up Dr. Craige Cotta please advise for JE while she is out on Maternity leave

## 2019-08-15 NOTE — Telephone Encounter (Signed)
It seems this was already addressed in phone note from 07/24/19.  Elisha Headland recommend to have patient set up with CPAP 15 cm H2O based on sleep study result from 2019.  Patient also should have been set up for recall visit in July 2021.  This message was signed off by Dr. Chestine Spore.

## 2019-08-15 NOTE — Telephone Encounter (Signed)
Looked at phone encounter and this was taken care of in the encounter from 07/24/19. Order was placed for pt's cpap by Cherina and a recall was also placed.  Nothing further needed.

## 2019-08-23 ENCOUNTER — Telehealth: Payer: Self-pay | Admitting: Pulmonary Disease

## 2019-08-23 NOTE — Telephone Encounter (Signed)
Spoke to Chrystal at [facility] and she states they did not get previous fax with order and sleep study.  Refaxed order/sleep study to them.  Called back & verified with Selena Batten order has been received.  I spoke to pt & made her aware of order having to be resent.  Gave her [facility] office # so she can follow up with them.  Nothing further needed.

## 2019-08-23 NOTE — Telephone Encounter (Signed)
I'm calling to check on this one.

## 2019-08-29 LAB — BASIC METABOLIC PANEL
BUN/Creatinine Ratio: 11 — ABNORMAL LOW (ref 12–28)
BUN: 17 mg/dL (ref 8–27)
CO2: 19 mmol/L — ABNORMAL LOW (ref 20–29)
Calcium: 9.3 mg/dL (ref 8.7–10.3)
Chloride: 102 mmol/L (ref 96–106)
Creatinine, Ser: 1.54 mg/dL — ABNORMAL HIGH (ref 0.57–1.00)
GFR calc Af Amer: 40 mL/min/{1.73_m2} — ABNORMAL LOW (ref >59)
GFR calc non Af Amer: 35 mL/min/{1.73_m2} — ABNORMAL LOW (ref >59)
Glucose: 150 mg/dL — ABNORMAL HIGH (ref 65–99)
Potassium: 4.3 mmol/L (ref 3.5–5.2)
Sodium: 138 mmol/L (ref 134–144)

## 2019-08-29 NOTE — Addendum Note (Signed)
Addended by: Eleonore Chiquito on: 08/29/2019 10:55 AM   Modules accepted: Orders

## 2019-08-30 ENCOUNTER — Telehealth (HOSPITAL_COMMUNITY): Payer: Self-pay | Admitting: *Deleted

## 2019-08-30 NOTE — Telephone Encounter (Signed)
Reaching out to patient to offer assistance regarding upcoming cardiac imaging study; pt and pt's daughter verbalizes understanding of appt date/time, parking situation and where to check in, pre-test NPO status and medications ordered, and verified current allergies; name and call back number provided for further questions should they arise   Tai RN Navigator Cardiac Imaging [facility] [phone] office [phone] cell  Pt to arrived at 12pm to check in with Admitting for IV hydration with Medical Day. 

## 2019-08-31 ENCOUNTER — Other Ambulatory Visit: Payer: Self-pay

## 2019-08-31 ENCOUNTER — Ambulatory Visit (HOSPITAL_COMMUNITY)
Admission: RE | Admit: 2019-08-31 | Discharge: 2019-08-31 | Disposition: A | Discharge status: Home / Self Care | Payer: Medicare Other | Source: Ambulatory Visit | Attending: Cardiology | Admitting: Cardiology

## 2019-08-31 DIAGNOSIS — I639 Cerebral infarction, unspecified: Secondary | ICD-10-CM | POA: Diagnosis not present

## 2019-08-31 DIAGNOSIS — I700 Atherosclerosis of aorta: Secondary | ICD-10-CM | POA: Diagnosis not present

## 2019-08-31 DIAGNOSIS — I2510 Atherosclerotic heart disease of native coronary artery without angina pectoris: Secondary | ICD-10-CM | POA: Insufficient documentation

## 2019-08-31 DIAGNOSIS — Z6829 Body mass index (BMI) 29.0-29.9, adult: Secondary | ICD-10-CM

## 2019-08-31 LAB — BASIC METABOLIC PANEL
Anion gap: 8 (ref 5–15)
BUN: 19 mg/dL (ref 8–23)
CO2: 21 mmol/L — ABNORMAL LOW (ref 22–32)
Calcium: 8.5 mg/dL — ABNORMAL LOW (ref 8.9–10.3)
Chloride: 102 mmol/L (ref 98–111)
Creatinine, Ser: 1.32 mg/dL — ABNORMAL HIGH (ref 0.44–1.00)
GFR calc Af Amer: 48 mL/min — ABNORMAL LOW (ref >60)
GFR calc non Af Amer: 42 mL/min — ABNORMAL LOW (ref >60)
Glucose, Bld: 94 mg/dL (ref 70–99)
Potassium: 4.3 mmol/L (ref 3.5–5.1)
Sodium: 131 mmol/L — ABNORMAL LOW (ref 135–145)

## 2019-08-31 MED ORDER — IOHEXOL 350 MG/ML SOLN
80.0000 mL | Freq: Once | INTRAVENOUS | Status: AC | PRN
  Administered 2019-08-31: 80 mL via INTRAVENOUS

## 2019-08-31 MED ORDER — SODIUM CHLORIDE 0.9 % WEIGHT BASED INFUSION
3.0000 mL/kg/h | INTRAVENOUS | Status: AC
  Administered 2019-08-31: 3 mL/kg/h via INTRAVENOUS

## 2019-08-31 MED ORDER — SODIUM CHLORIDE 0.9 % WEIGHT BASED INFUSION: 1.0000 mL/kg/h | INTRAVENOUS | Status: DC

## 2019-09-01 DIAGNOSIS — Z6829 Body mass index (BMI) 29.0-29.9, adult: Secondary | ICD-10-CM | POA: Diagnosis not present

## 2019-09-01 DIAGNOSIS — R931 Abnormal findings on diagnostic imaging of heart and coronary circulation: Secondary | ICD-10-CM | POA: Diagnosis not present

## 2019-09-01 DIAGNOSIS — I2510 Atherosclerotic heart disease of native coronary artery without angina pectoris: Secondary | ICD-10-CM | POA: Diagnosis not present

## 2019-09-01 DIAGNOSIS — I639 Cerebral infarction, unspecified: Secondary | ICD-10-CM

## 2019-09-04 ENCOUNTER — Telehealth: Payer: Self-pay

## 2019-09-04 DIAGNOSIS — Q2112 Patent foramen ovale: Secondary | ICD-10-CM

## 2019-09-04 NOTE — Telephone Encounter (Signed)
-----   Message from Thomasene Ripple, DO sent at 09/02/2019 10:45 AM EDT ----- Your CT thankfully showed no clot in the left atrial appendage. It did showed that there are some blockages in the heart artery. This is separate and not a source of stroke. Not to worry -we will discuss this at your upcoming visit. I will wait for the monitor information. For now please see EP and planned and continue current medication regimen. I will see you as schedule in July.

## 2019-09-04 NOTE — Telephone Encounter (Signed)
-----   Message from Chloe Ripple, DO sent at 09/02/2019 11:27 AM EDT ----- I was able to speak to the patient about her results with her daughter [name]. Please send a referral to the structural heart clinic for the patient to see Dr. Excell Seltzer for evaluation for PFO in the setting of cryptogenic stroke.

## 2019-09-04 NOTE — Telephone Encounter (Signed)
Spoke with patient's daughter (DPR). Scheduled her for PFO consult with Dr. Excell Seltzer 6/28. She was grateful for assistance.

## 2019-09-04 NOTE — Telephone Encounter (Signed)
Spoke with patients daughter regarding results and recommendation.  She verbalizes understanding and is agreeable to plan of care. Advised for the patient to call back with any issues or concerns.   

## 2019-09-04 NOTE — Telephone Encounter (Signed)
Referral needed to structural heart per Dr. Servando Salina.

## 2019-09-25 ENCOUNTER — Encounter: Payer: Self-pay | Admitting: Cardiology

## 2019-09-25 ENCOUNTER — Other Ambulatory Visit: Payer: Self-pay

## 2019-09-25 ENCOUNTER — Encounter: Payer: Self-pay | Admitting: Cardiovascular Disease

## 2019-09-25 ENCOUNTER — Ambulatory Visit (INDEPENDENT_AMBULATORY_CARE_PROVIDER_SITE_OTHER): Payer: Medicare Other | Admitting: Cardiology

## 2019-09-25 ENCOUNTER — Ambulatory Visit: Payer: Medicare Other | Admitting: Cardiovascular Disease

## 2019-09-25 VITALS — BP 118/72 | HR 77 | Ht 65.0 in | Wt 184.0 lb

## 2019-09-25 VITALS — BP 130/68 | HR 82 | Ht 65.0 in | Wt 183.0 lb

## 2019-09-25 DIAGNOSIS — Q211 Atrial septal defect: Secondary | ICD-10-CM

## 2019-09-25 DIAGNOSIS — I639 Cerebral infarction, unspecified: Secondary | ICD-10-CM

## 2019-09-25 DIAGNOSIS — Q2112 Patent foramen ovale: Secondary | ICD-10-CM

## 2019-09-25 NOTE — Patient Instructions (Signed)
We will be in contact with you after you have had your LINQ for a few months of monitoring. It was a pleasure to meet you!

## 2019-09-25 NOTE — Progress Notes (Signed)
Cardiology Office Note:    Date:  09/25/2019   ID:  [name], DOB 01/18/1951, MRN [id number]  PCP:  Hal Morales, NP  [facility] Cardiologist:  Thomasene Ripple, DO  [facility] Electrophysiologist:  None   Referring MD: Thomasene Ripple, DO   Chief Complaint  Patient presents with  . PFO Consultation    History of Present Illness:    Chloe Gray is a 69 y.o. female with a hx of recurrent strokes, referred by Dr Servando Salina for consideration of PFO closure.  The patient is here with her daughter today.  She has had multiple strokes and has residual issues with memory, speech, gait instability, and right-sided weakness.  The patient has undergone an MRI/MRA of the brain demonstrating infarction in the posterior left MCA territory small cortical infarcts in the posterior right PCA for MCA/PCA watershed area.  She is felt to have both scattered chronic cortical infarcts and lacunar infarcts in the bilateral cerebellum and left thalamus.  She has not been found to have large vessel disease.  She underwent a transesophageal echo demonstrating a moderate to large PFO with positive bubble study for right to left shunt at the atrial septal level.  The patient has been started on CPAP and seems to be tolerating this fairly well over the last week.  She was seen by Dr. Elberta Fortis earlier today in [place].  She has been scheduled for an implantable loop recorder to evaluate for atrial fibrillation.  The patient does admit to shortness of breath with activity.  She does not have chest pain, leg edema, orthopnea, or PND.  Most of the history is obtained from her daughter.  Past Medical History:  Diagnosis Date  . Anxiety   . Arthritis   . Asthma    PFT 12/12  PCP on chart  . Cerebral aneurysm without rupture 10/14/2017  . Cerebrovascular accident (CVA) (HCC) 07/11/2019  . Depression    states well controlled  . Essential hypertension 07/11/2019  . GERD (gastroesophageal reflux disease)   . H/O hiatal  hernia   . Hypercholesterolemia   . Hypertension    clearance note with [facility] Dr Lorin Picket, EKG on chart 03/09/11  . MCI (mild cognitive impairment) 10/14/2017  . Mixed hyperlipidemia 07/11/2019  . OSA (obstructive sleep apnea) 07/11/2019  . Osteoarthritis of right knee 05/01/2011  . Pneumonia   . Shortness of breath    with asthma  . Sleep apnea   . Stroke (HCC)   . 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 Vitamin B 12 deficiency     Past Surgical History:  Procedure Laterality Date  . COLONOSCOPY    . KNEE ARTHROSCOPY    . TOTAL KNEE ARTHROPLASTY  04/30/2011   Procedure: TOTAL KNEE ARTHROPLASTY;  Surgeon: Javier Docker, MD;  Location: [facility];  Service: Orthopedics;  Laterality: Right;  Femoral nerve block  . TUBAL LIGATION    . UMBILICAL HERNIA REPAIR      Current Medications: Current Meds  Medication Sig  . albuterol (PROVENTIL HFA;VENTOLIN HFA) 108 (90 BASE) MCG/ACT inhaler Inhale 2 puffs into the lungs every 6 (six) hours as needed. For shortness of breath.  Marland Kitchen albuterol (PROVENTIL) (2.5 MG/3ML) 0.083% nebulizer solution Take 2.5 mg by nebulization every 6 (six) hours as needed. For shortness of breath.  . ALPRAZolam (XANAX) 1 MG tablet Take 1 mg by mouth 3 (three) times daily with meals.   Marland Kitchen amLODipine (NORVASC) 10 MG tablet Take 10 mg by mouth daily.  Marland Kitchen  apixaban (ELIQUIS) 5 MG TABS tablet Take 1 tablet (5 mg total) by mouth 2 (two) times daily.  Marland Kitchen buPROPion (WELLBUTRIN XL) 150 MG 24 hr tablet Take 150 mg by mouth daily.  . citalopram (CELEXA) 10 MG tablet Take 30 mg by mouth at bedtime.  . cyanocobalamin (,VITAMIN B-12,) 1000 MCG/ML injection Inject 100 mcg into the muscle every 30 (thirty) days.  . famotidine (PEPCID) 40 MG tablet Take 40 mg by mouth daily.  . furosemide (LASIX) 20 MG tablet TAKE ONE TABLET DAILY AS NEEDED FOR SWELLING OF LEGS  . lisinopril (ZESTRIL) 40 MG tablet Take 40 mg by mouth daily.  . metoprolol tartrate (LOPRESSOR) 100 MG tablet Take 100 mg by mouth  daily.  . montelukast (SINGULAIR) 10 MG tablet Take 10 mg by mouth daily.  . rosuvastatin (CRESTOR) 10 MG tablet Take by mouth.  . sodium bicarbonate 650 MG tablet Take 1,300 mg by mouth 2 (two) times daily.  . traZODone (DESYREL) 100 MG tablet Take 100 mg by mouth at bedtime as needed.      Allergies:   Latex   Social History   Socioeconomic History  . Marital status: Married    Spouse name: Not on file  . Number of children: Not on file  . Years of education: Not on file  . Highest education level: Not on file  Occupational History  . Not on file  Tobacco Use  . Smoking status: Never Smoker  . Smokeless tobacco: Never Used  Vaping Use  . Vaping Use: Never used  Substance and Sexual Activity  . Alcohol use: No  . Drug use: No  . Sexual activity: Not on file  Other Topics Concern  . Not on file  Social History Narrative  . Not on file   Social Determinants of Health   Financial Resource Strain:   . Difficulty of Paying Living Expenses:   Food Insecurity:   . Worried About Programme researcher, broadcasting/film/video in the Last Year:   . Barista in the Last Year:   Transportation Needs:   . Freight forwarder (Medical):   Marland Kitchen Lack of Transportation (Non-Medical):   Physical Activity:   . Days of Exercise per Week:   . Minutes of Exercise per Session:   Stress:   . Feeling of Stress :   Social Connections:   . Frequency of Communication with Friends and Family:   . Frequency of Social Gatherings with Friends and Family:   . Attends Religious Services:   . Active Member of Clubs or Organizations:   . Attends Banker Meetings:   Marland Kitchen Marital Status:      Family History: The patient's family history includes Hypertension in her brother, father, mother, sister, sister, and sister; Stroke in her mother.  ROS:   Please see the history of present illness.    All other systems reviewed and are negative.  EKGs/Labs/Other Studies Reviewed:    The following studies  were reviewed today: Transesophageal echo: CONCLUSIONS ----------- 1. This was a technically difficult study with suboptimal views. 2. The left ventricle size is normal. 3. Overall left ventricular systolic function is normal with, an EF between 55 - 60 %. 4. The is a small mobile mass in the left atrial appendage ( clip 14 and 15, at 84 degrees) highly suspicious for a clot. 5. The right ventricle is normal in size and function, but not well visualized. 6. The right atrium was not well visualized. 7. There  is a 0.8cm patent foramen ovale noted in the interatrial septum with positive color flow. Agitated saline study was positive. 8. The aortic valve is trileaflet and appears structurally normal. 9. There is no evidence of aortic regurgitation. 10. There is no evidence of aortic stenosis. 11. The mitral valve is normal. 12. There is trace mitral regurgitation. 13. The tricuspid valve appears structurally normal. 14. The pulmonic valve is normal. 15. There is no pulmonic regurgitation present. 16. Non complex (< 63mm), atherosclerotic plaque(s) located in the ascending and descending aorta. 17. The pulmonic artery is normal. 18. The pulmonary veins were not recorded. 19. There is no pericardial effusion.  CTA Heart: Aorta: Normal size. Mild diffuse atherosclerotic plaque and calcifications. No dissection.  Aortic Valve:  Trileaflet.  Trivial calcifications.  Coronary Arteries:  Normal coronary origin.  Right dominance.  RCA is a medium caliber dominant artery that gives rise to PDA and PLA. There is minimal plaque, PDA and PLA are poorly visualized.  Left main is a short artery that gives rise to LAD and LCX arteries. Distal left main has mild calcified plaque with stenosis 25-49%.  LAD is a large vessel that has mild long calcified plaque in the proximal segment with associated stenosis 25-49%. This is followed by a short intramyocardial bridge. Mid and distal LAD have  only minimal plaque.  LCX is a small lumen non-dominant artery that gives rise to one small OM1 branch. There is mild to moderate plaque. The lumen is too small to evaluate.  Other findings:  Normal pulmonary vein drainage into the left atrium.  Normal left atrial appendage with two lobes and no evidence for a thrombus.  Normal size of the pulmonary artery.  IMPRESSION: 1. Coronary calcium score of 361. This was 79 percentile for age and sex matched control.  2. Normal coronary origin with right dominance.  3. 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 Consider preventive therapy and risk factor modification. Additional analysis with CT FFR will be submitted.  4. Normal left atrial appendage with two lobes and no evidence for a thrombus.  EKG:  EKG is ordered today.  The ekg ordered today demonstrates normal sinus rhythm 77 bpm, left axis deviation, nonspecific ST abnormality.  Recent Labs: 07/28/2019: Hemoglobin 14.4; Platelets 311 08/31/2019: BUN 19; Creatinine, Ser 1.32; Potassium 4.3; Sodium 131  Recent Lipid Panel    Component Value Date/Time   CHOL 146 10/14/2017 1033   TRIG 134 10/14/2017 1033   HDL 53 10/14/2017 1033   CHOLHDL 2.8 10/14/2017 1033   LDLCALC 66 10/14/2017 1033    Physical Exam:    VS:  BP 118/72   Pulse 77   Ht 5\' 5"  (1.651 m)   Wt 184 lb (83.5 kg)   SpO2 96%   BMI 30.62 kg/m     Wt Readings from Last 3 Encounters:  09/25/19 184 lb (83.5 kg)  09/25/19 183 lb (83 kg)  08/31/19 175 lb (79.4 kg)     GEN: Alert, oriented woman, flat affect, in no acute distress HEENT: Normal NECK: No JVD; No carotid bruits LYMPHATICS: No lymphadenopathy CARDIAC: RRR, no murmurs, rubs, gallops RESPIRATORY:  Clear to auscultation without rales, wheezing or rhonchi  ABDOMEN: Soft, non-tender, non-distended MUSCULOSKELETAL:  No edema; No  deformity  SKIN: Warm and dry NEUROLOGIC:  Alert and oriented x 3 PSYCHIATRIC: Flat affect   ASSESSMENT:    1. PFO (  patent foramen ovale)   2. Cryptogenic stroke [facility])    PLAN:    In order of problems listed above:  The patient has multiple risk factors for stroke.  It certainly seems appropriate to monitor her for atrial fibrillation.  I have not been able to review her full TEE study, but we will send for this so that images can be reviewed.  I have seen a few clips of her TEE and she does appear to have a significant PFO with right-to-left shunting.  Initially there was concern for a left atrial appendage thrombus, but CTA of the heart has excluded this.  For this patients PFO, there is a low-to-moderate likelihood (38%) of contribution to stroke/TIA with a RoPE score of 4.  Above score calculated as 1 point each if NOT present [HTN, DM2, h/o CVA/TIA, Smoker, Cortical imaging on infarct] Above score calculated as 5 points if [Age 54 to 29], 4 points if [Age 72 to 39], 3 points if [Age 58 to 49], 2 points if [Age 66 to 59], 1 point if [Age 9 to 69], 0 points if [Age > 70]  We discussed our typical protocol with atrial fibrillation monitoring.  We will also review her case with neurology to see if we can come to agreement on whether PFO closure might be appropriate.  I advised them that she should be monitored for at least 2 to 3 months after loop recorder implant before pursuing PFO closure.  In the meantime she will continue with medical therapy for hypertension, mixed hyperlipidemia, and ongoing risk reduction measures.  Medication Adjustments/Labs and Tests Ordered: Current medicines are reviewed at length with the patient today.  Concerns regarding medicines are outlined above.  Orders Placed This Encounter  Procedures  . EKG 12-Lead   No orders of the defined types were placed in this encounter.   Patient Instructions  We will be in contact with you after you have had your LINQ  for a few months of monitoring. It was a pleasure to meet you!    Signed, Sherren Mocha, MD  09/25/2019 5:06 PM    [place]

## 2019-09-25 NOTE — Progress Notes (Signed)
I agree with the above plan 

## 2019-09-25 NOTE — Patient Instructions (Signed)
Medication Instructions:  Your physician recommends that you continue on your current medications as directed. Please refer to the Current Medication list given to you today.  *If you need a refill on your cardiac medications before your next appointment, please call your pharmacy*   Lab Work: None ordered   Testing/Procedures: Your physician recommends that you have an implantable loop recorder placed. The office will call to arrange this once it has been pre-certified with insurance (if they require pre authorization)   Follow-Up: At [facility], you and your health needs are our priority.  As part of our continuing mission to provide you with exceptional heart care, we have created designated Provider Care Teams.  These Care Teams include your primary Cardiologist (physician) and Advanced Practice Providers (APPs -  Physician Assistants and Nurse Practitioners) who all work together to provide you with the care you need, when you need it.  We recommend signing up for the patient portal called "MyChart".  Sign up information is provided on this After Visit Summary.  MyChart is used to connect with patients for Virtual Visits (Telemedicine).  Patients are able to view lab/test results, encounter notes, upcoming appointments, etc.  Non-urgent messages can be sent to your provider as well.   To learn more about what you can do with MyChart, go to [email or web address].    Your next appointment:   7-10 day(s) after your loop recorder implant  The format for your next appointment:   In Person  Provider:   device clinic for a wound check   Thank you for choosing [facility]!!   Trinidad Curet, RN [phone]    Other Instructions   Implantable Loop Recorder Placement  An implantable loop recorder is a small electronic device that is placed under the skin of your chest. It is about the size of an AA ("double A") battery. The device records the electrical activity of  your heart over a long period of time. Your health care provider can download these recordings to monitor your heart. You may need an implantable loop recorder if you have periods of abnormal heart activity (arrhythmias) or unexplained fainting (syncope). The recorder can be left in place for 1 year or longer. Tell a health care provider about:  Any allergies you have.  All medicines you are taking, including vitamins, herbs, eye drops, creams, and over-the-counter medicines.  Any problems you or family members have had with anesthetic medicines.  Any blood disorders you have.  Any surgeries you have had.  Any medical conditions you have.  Whether you are pregnant or may be pregnant. What are the risks? Generally, this is a safe procedure. However, problems may occur, including:  Infection.  Bleeding.  Allergic reactions to anesthetic medicines.  Damage to nerves or blood vessels.  Failure of the device to work. This could require another surgery to replace it. What happens before the procedure?   You may have a physical exam, blood tests, and imaging tests of your heart, such as a chest X-ray.  Follow instructions from your health care provider about eating or drinking restrictions.  Ask your health care provider about: ? Changing or stopping your regular medicines. This is especially important if you are taking diabetes medicines or blood thinners. ? Taking medicines such as aspirin and ibuprofen. These medicines can thin your blood. Do not take these medicines unless your health care provider tells you to take them. ? Taking over-the-counter medicines, vitamins, herbs, and supplements.  Ask your health care  provider how your surgical site will be marked or identified.  Ask your health care provider what steps will be taken to help prevent infection. These may include: ? Removing hair at the surgery site. ? Washing skin with a germ-killing soap.  Plan to have someone  take you home from the hospital or clinic.  Plan to have a responsible adult care for you for at least 24 hours after you leave the hospital or clinic. This is important.  Do not use any products that contain nicotine or tobacco, such as cigarettes and e-cigarettes. If you need help quitting, ask your health care provider. What happens during the procedure?  An IV will be inserted into one of your veins.  You may be given one or more of the following: ? A medicine to help you relax (sedative). ? A medicine to numb the area (local anesthetic).  A small incision will be made on the left side of your upper chest.  A pocket will be created under your skin.  The device will be placed in the pocket.  The incision will be closed with stitches (sutures) or adhesive strips.  A bandage (dressing) will be placed over the incision. The procedure may vary among health care providers and hospitals. What happens after the procedure?  Your blood pressure, heart rate, breathing rate, and blood oxygen level will be monitored until you leave the hospital or clinic.  You may be able to go home on the day of your surgery. Before you go home: ? Your health care provider will program your recorder. ? You will learn how to trigger your device with a handheld activator. ? You will learn how to send recordings to your health care provider. ? You will get an ID card for your device, and you will be told when to use it.  Do not drive for 24 hours if you were given a sedative during your procedure. Summary  An implantable loop recorder is a small electronic device that is placed under the skin of your chest to monitor your heart over a long period of time.  The recorder can be left in place for 1 year or longer.  Plan to have someone take you home from the hospital or clinic. This information is not intended to replace advice given to you by your health care provider. Make sure you discuss any questions  you have with your health care provider. Document Revised: 05/20/2017 Document Reviewed: 05/01/2017 Elsevier Patient Education  2020 [employer].

## 2019-09-25 NOTE — Progress Notes (Signed)
Electrophysiology Office Note   Date:  09/25/2019   ID:  Chloe Gray, DOB 23-Nov-1950, MRN [id number]  PCP:  Hal Morales, NP  Cardiologist: Servando Salina Primary Electrophysiologist:  Bruk Tumolo Jorja Loa, MD    Chief Complaint: CVA   History of Present Illness: Chloe Gray is a 69 y.o. female who is being seen today for the evaluation of CVA at the request of Tobb, Kardie, DO. Presenting today for electrophysiology evaluation.  She has a history significant for hypertension, hyperlipidemia, and multiple CVAs.  Her most recent CVA was December 2020.  She also has chronic diastolic heart failure.  She has had an echo with no major abnormality and wore a cardiac monitor that showed no arrhythmias that would put her at risk of thromboembolism.  She had a cardiac CT that showed no evidence of thrombus in her left atrial appendage.  Today, she denies symptoms of palpitations, chest pain, shortness of breath, orthopnea, PND, lower extremity edema, claudication, dizziness, presyncope, syncope, bleeding, or neurologic sequela. The patient is tolerating medications without difficulties.    Past Medical History:  Diagnosis Date  . Anxiety   . Arthritis   . Asthma    PFT 12/12  PCP on chart  . Cerebral aneurysm without rupture 10/14/2017  . Cerebrovascular accident (CVA) (HCC) 07/11/2019  . Depression    states well controlled  . Essential hypertension 07/11/2019  . GERD (gastroesophageal reflux disease)   . H/O hiatal hernia   . Hypercholesterolemia   . Hypertension    clearance note with OV Dr Lorin Picket, EKG on chart 03/09/11  . MCI (mild cognitive impairment) 10/14/2017  . Mixed hyperlipidemia 07/11/2019  . OSA (obstructive sleep apnea) 07/11/2019  . Osteoarthritis of right knee 05/01/2011  . Pneumonia   . Shortness of breath    with asthma  . Sleep apnea   . Stroke (HCC)   . 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 Vitamin B 12 deficiency    Past Surgical History:  Procedure Laterality  Date  . COLONOSCOPY    . KNEE ARTHROSCOPY    . TOTAL KNEE ARTHROPLASTY  04/30/2011   Procedure: TOTAL KNEE ARTHROPLASTY;  Surgeon: Javier Docker, MD;  Location: [facility];  Service: Orthopedics;  Laterality: Right;  Femoral nerve block  . TUBAL LIGATION    . UMBILICAL HERNIA REPAIR       Current Outpatient Medications  Medication Sig Dispense Refill  . albuterol (PROVENTIL HFA;VENTOLIN HFA) 108 (90 BASE) MCG/ACT inhaler Inhale 2 puffs into the lungs every 6 (six) hours as needed. For shortness of breath.    Marland Kitchen albuterol (PROVENTIL) (2.5 MG/3ML) 0.083% nebulizer solution Take 2.5 mg by nebulization every 6 (six) hours as needed. For shortness of breath.    . ALPRAZolam (XANAX) 1 MG tablet Take 1 mg by mouth 3 (three) times daily with meals.     Marland Kitchen amLODipine (NORVASC) 10 MG tablet Take 10 mg by mouth daily.    Marland Kitchen apixaban (ELIQUIS) 5 MG TABS tablet Take 1 tablet (5 mg total) by mouth 2 (two) times daily. 60 tablet 2  . buPROPion (WELLBUTRIN XL) 150 MG 24 hr tablet Take 150 mg by mouth daily.    . citalopram (CELEXA) 10 MG tablet Take 30 mg by mouth at bedtime.    . cyanocobalamin (,VITAMIN B-12,) 1000 MCG/ML injection Inject 100 mcg into the muscle every 30 (thirty) days.    . famotidine (PEPCID) 40 MG tablet Take 40 mg by mouth daily.    . furosemide (  LASIX) 20 MG tablet TAKE ONE TABLET DAILY AS NEEDED FOR SWELLING OF LEGS  1  . lisinopril (ZESTRIL) 40 MG tablet Take 40 mg by mouth daily.    . metoprolol tartrate (LOPRESSOR) 100 MG tablet Take 100 mg by mouth daily.    . montelukast (SINGULAIR) 10 MG tablet Take 10 mg by mouth daily.    . rosuvastatin (CRESTOR) 10 MG tablet Take by mouth.    . sodium bicarbonate 650 MG tablet Take 1,300 mg by mouth 2 (two) times daily.  3  . traZODone (DESYREL) 100 MG tablet Take 100 mg by mouth at bedtime as needed.      No current facility-administered medications for this visit.    Allergies:   Latex   Social History:  The patient  reports that she  has never smoked. She has never used smokeless tobacco. She reports that she does not drink alcohol and does not use drugs.   Family History:  The patient's family history includes Hypertension in her brother, father, mother, sister, sister, and sister; Stroke in her mother.    ROS:  Please see the history of present illness.   Otherwise, review of systems is positive for none.   All other systems are reviewed and negative.    PHYSICAL EXAM: VS:  BP 130/68   Pulse 82   Ht 5\' 5"  (1.651 m)   Wt 183 lb (83 kg)   SpO2 95%   BMI 30.45 kg/m  , BMI Body mass index is 30.45 kg/m. GEN: Well nourished, well developed, in no acute distress  HEENT: normal  Neck: no JVD, carotid bruits, or masses Cardiac: RRR; no murmurs, rubs, or gallops,no edema  Respiratory:  clear to auscultation bilaterally, normal work of breathing GI: soft, nontender, nondistended, + BS MS: no deformity or atrophy  Skin: warm and dry Neuro:  Strength and sensation are intact Psych: euthymic mood, full affect  EKG:  EKG is not ordered today. Personal review of the ekg ordered 07/11/2019 shows sinus rhythm, rate 91  Recent Labs: 07/28/2019: Hemoglobin 14.4; Platelets 311 08/31/2019: BUN 19; Creatinine, Ser 1.32; Potassium 4.3; Sodium 131    Lipid Panel     Component Value Date/Time   CHOL 146 10/14/2017 1033   TRIG 134 10/14/2017 1033   HDL 53 10/14/2017 1033   CHOLHDL 2.8 10/14/2017 1033   LDLCALC 66 10/14/2017 1033     Wt Readings from Last 3 Encounters:  09/25/19 183 lb (83 kg)  08/31/19 175 lb (79.4 kg)  08/01/19 180 lb (81.6 kg)      Other studies Reviewed: Additional studies/ records that were reviewed today include: TTE 03/06/19  Review of the above records today demonstrates:  Mild concentric LVH Ejection fraction 55 to 60% No cardiac thrombus  Cardiac monitor 08/29/2019 personally reviewed No ventricular tachycardia, no pauses, No second or third degree  AV block , no supraventricular  tachycardia and no atrial fibrillation present. 3  patient triggered events associated with sinus rhtythm  ASSESSMENT AND PLAN:  1.  Cryptogenic stroke: Patient has had a echo, cardiac monitor, and coronary CT that showed no evidence of thrombus.  She would most likely benefit from Linq monitor implant.  Risks and benefits were discussed risk of bleeding and infection.  She understands these risks and has agreed to the procedure.  2.  Hypertension: Currently well controlled  3.  Obstructive sleep apnea: CPAP compliance encouraged  Case discussed with primary cardiology  Current medicines are reviewed at length with the  patient today.   The patient does not have concerns regarding her medicines.  The following changes were made today:  none  Labs/ tests ordered today include:  No orders of the defined types were placed in this encounter.    Disposition:   FU with Tyqwan Pink Pending link monitor results  Signed, Abner Ardis Jorja Loa, MD  09/25/2019 12:18 PM     [facility] [address] [place] [address] [phone] (office) [phone] (fax) Randie Heinz

## 2019-09-28 ENCOUNTER — Telehealth: Payer: Self-pay | Admitting: Cardiovascular Disease

## 2019-09-28 NOTE — Telephone Encounter (Signed)
Medical records requested from [facility]. 09/28/19 vlm

## 2019-10-09 ENCOUNTER — Ambulatory Visit
Admission: RE | Admit: 2019-10-09 | Discharge: 2019-10-09 | Disposition: A | Discharge status: Home / Self Care | Payer: Self-pay | Source: Ambulatory Visit | Attending: Cardiovascular Disease | Admitting: Cardiovascular Disease

## 2019-10-09 ENCOUNTER — Other Ambulatory Visit: Payer: Self-pay

## 2019-10-09 DIAGNOSIS — Q2112 Patent foramen ovale: Secondary | ICD-10-CM

## 2019-10-10 ENCOUNTER — Ambulatory Visit: Payer: Medicare Other | Admitting: Cardiology

## 2019-10-10 ENCOUNTER — Encounter: Payer: Self-pay | Admitting: Cardiology

## 2019-10-10 ENCOUNTER — Other Ambulatory Visit: Payer: Self-pay

## 2019-10-10 VITALS — BP 110/60 | HR 74 | Ht 65.0 in | Wt 181.0 lb

## 2019-10-10 DIAGNOSIS — I2510 Atherosclerotic heart disease of native coronary artery without angina pectoris: Secondary | ICD-10-CM

## 2019-10-10 DIAGNOSIS — E782 Mixed hyperlipidemia: Secondary | ICD-10-CM | POA: Diagnosis not present

## 2019-10-10 DIAGNOSIS — G4733 Obstructive sleep apnea (adult) (pediatric): Secondary | ICD-10-CM | POA: Diagnosis not present

## 2019-10-10 DIAGNOSIS — I639 Cerebral infarction, unspecified: Secondary | ICD-10-CM | POA: Insufficient documentation

## 2019-10-10 DIAGNOSIS — I10 Essential (primary) hypertension: Secondary | ICD-10-CM

## 2019-10-10 HISTORY — DX: Atherosclerotic heart disease of native coronary artery without angina pectoris: I25.10

## 2019-10-10 HISTORY — DX: Cerebral infarction, unspecified: I63.9

## 2019-10-10 NOTE — Patient Instructions (Signed)
Medication Instructions:  No medication changes. *If you need a refill on your cardiac medications before your next appointment, please call your pharmacy*   Lab Work: None ordered If you have labs (blood work) drawn today and your tests are completely normal, you will receive your results only by: Marland Kitchen MyChart Message (if you have MyChart) OR . A paper copy in the mail If you have any lab test that is abnormal or we need to change your treatment, we will call you to review the results.   Testing/Procedures: None ordered   Follow-Up: At [facility], you and your health needs are our priority.  As part of our continuing mission to provide you with exceptional heart care, we have created designated Provider Care Teams.  These Care Teams include your primary Cardiologist (physician) and Advanced Practice Providers (APPs -  Physician Assistants and Nurse Practitioners) who all work together to provide you with the care you need, when you need it.  We recommend signing up for the patient portal called "MyChart".  Sign up information is provided on this After Visit Summary.  MyChart is used to connect with patients for Virtual Visits (Telemedicine).  Patients are able to view lab/test results, encounter notes, upcoming appointments, etc.  Non-urgent messages can be sent to your provider as well.   To learn more about what you can do with MyChart, go to [email or web address].    Your next appointment:   4 month(s)  The format for your next appointment:   In Person  Provider:   Thomasene Ripple, DO   Other Instructions NA

## 2019-10-10 NOTE — Progress Notes (Signed)
Cardiology Office Note:    Date:  10/10/2019   ID:  [name], DOB April 07, 1950, MRN [id number]  PCP:  Hal Morales, NP  Cardiologist:  Thomasene Ripple, DO  Electrophysiologist:  None   Referring MD: Hal Morales, NP   " I am doing well"   History of Present Illness:    Chloe Gray is a 69 y.o. female with a hx of femalewith a hx of hypertension, hyperlipidemia, multiple CVAs with the most recent CVA in December 2020 chronic diastolic heart failure.  I did see the patient for the first time on July 11, 2019 at that time due to her strokes discuss ruling out cardiac embolic events.  We did do a TEE on this patient which showed 0.8 cm patent foramen ovale.  There was suspicion for left atrial appendage clot however CT was done which showed no evidence of clot.  She did wear a monitor for 14 days which showed no A. fib but due to history of cryptogenic stroke a loop recorder was more appropriate therefore I recommended patient see EP.  In the meantime I also asked the patient to see our structural clinic to be evaluated for PFO closure.  In the interim she was able to see EP and loop recorder scheduling is pending.  She also did see Dr. Excell Seltzer in our structural heart clinic and at this time further decision for recommendation on PFO closure is pending loop recorder results as well as discussion with neuro.  Patient tells me that she has been doing well from a cardiovascular standpoint she is taking her medications as prescribed.  She is here today with her daughter.  Past Medical History:  Diagnosis Date  . Anxiety   . Arthritis   . Asthma    PFT 12/12  PCP on chart  . Cerebral aneurysm without rupture 10/14/2017  . Cerebrovascular accident (CVA) (HCC) 07/11/2019  . Depression    states well controlled  . Essential hypertension 07/11/2019  . GERD (gastroesophageal reflux disease)   . H/O hiatal hernia   . Hypercholesterolemia   . Hypertension    clearance note with OV Dr Lorin Picket,  EKG on chart 03/09/11  . MCI (mild cognitive impairment) 10/14/2017  . Mixed hyperlipidemia 07/11/2019  . OSA (obstructive sleep apnea) 07/11/2019  . Osteoarthritis of right knee 05/01/2011  . Pneumonia   . Shortness of breath    with asthma  . Sleep apnea   . Stroke (HCC)   . 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 Vitamin B 12 deficiency     Past Surgical History:  Procedure Laterality Date  . COLONOSCOPY    . KNEE ARTHROSCOPY    . TOTAL KNEE ARTHROPLASTY  04/30/2011   Procedure: TOTAL KNEE ARTHROPLASTY;  Surgeon: Javier Docker, MD;  Location: [facility];  Service: Orthopedics;  Laterality: Right;  Femoral nerve block  . TUBAL LIGATION    . UMBILICAL HERNIA REPAIR      Current Medications: Current Meds  Medication Sig  . albuterol (PROVENTIL HFA;VENTOLIN HFA) 108 (90 BASE) MCG/ACT inhaler Inhale 2 puffs into the lungs every 6 (six) hours as needed. For shortness of breath.  Marland Kitchen albuterol (PROVENTIL) (2.5 MG/3ML) 0.083% nebulizer solution Take 2.5 mg by nebulization every 6 (six) hours as needed. For shortness of breath.  . ALPRAZolam (XANAX) 1 MG tablet Take 1 mg by mouth 3 (three) times daily with meals.   Marland Kitchen amLODipine (NORVASC) 10 MG tablet Take 10 mg by mouth daily.  Marland Kitchen  apixaban (ELIQUIS) 5 MG TABS tablet Take 1 tablet (5 mg total) by mouth 2 (two) times daily.  Marland Kitchen. buPROPion (WELLBUTRIN XL) 150 MG 24 hr tablet Take 150 mg by mouth daily.  . citalopram (CELEXA) 10 MG tablet Take 30 mg by mouth at bedtime.  . cyanocobalamin (,VITAMIN B-12,) 1000 MCG/ML injection Inject 100 mcg into the muscle every 30 (thirty) days.  . famotidine (PEPCID) 40 MG tablet Take 40 mg by mouth daily.  . furosemide (LASIX) 20 MG tablet TAKE ONE TABLET DAILY AS NEEDED FOR SWELLING OF LEGS  . lisinopril (ZESTRIL) 40 MG tablet Take 40 mg by mouth daily.  . metoprolol tartrate (LOPRESSOR) 100 MG tablet Take 100 mg by mouth daily.  . montelukast (SINGULAIR) 10 MG tablet Take 10 mg by mouth daily.  .  rosuvastatin (CRESTOR) 10 MG tablet Take by mouth.  . sodium bicarbonate 650 MG tablet Take 1,300 mg by mouth 2 (two) times daily.  . traZODone (DESYREL) 100 MG tablet Take 100 mg by mouth at bedtime as needed.      Allergies:   Latex   Social History   Socioeconomic History  . Marital status: Married    Spouse name: Not on file  . Number of children: Not on file  . Years of education: Not on file  . Highest education level: Not on file  Occupational History  . Not on file  Tobacco Use  . Smoking status: Never Smoker  . Smokeless tobacco: Never Used  Vaping Use  . Vaping Use: Never used  Substance and Sexual Activity  . Alcohol use: No  . Drug use: No  . Sexual activity: Not on file  Other Topics Concern  . Not on file  Social History Narrative  . Not on file   Social Determinants of Health   Financial Resource Strain:   . Difficulty of Paying Living Expenses:   Food Insecurity:   . Worried About Programme researcher, broadcasting/film/videounning Out of Food in the Last Year:   . Baristaan Out of Food in the Last Year:   Transportation Needs:   . Freight forwarderLack of Transportation (Medical):   Marland Kitchen. Lack of Transportation (Non-Medical):   Physical Activity:   . Days of Exercise per Week:   . Minutes of Exercise per Session:   Stress:   . Feeling of Stress :   Social Connections:   . Frequency of Communication with Friends and Family:   . Frequency of Social Gatherings with Friends and Family:   . Attends Religious Services:   . Active Member of Clubs or Organizations:   . Attends BankerClub or Organization Meetings:   Marland Kitchen. Marital Status:      Family History: The patient's family history includes Hypertension in her brother, father, mother, sister, sister, and sister; Stroke in her mother.  ROS:   Review of Systems  Constitution: Negative for decreased appetite, fever and weight gain.  HENT: Negative for congestion, ear discharge, hoarse voice and sore throat.   Eyes: Negative for discharge, redness, vision loss in right eye  and visual halos.  Cardiovascular: Negative for chest pain, dyspnea on exertion, leg swelling, orthopnea and palpitations.  Respiratory: Negative for cough, hemoptysis, shortness of breath and snoring.   Endocrine: Negative for heat intolerance and polyphagia.  Hematologic/Lymphatic: Negative for bleeding problem. Does not bruise/bleed easily.  Skin: Negative for flushing, nail changes, rash and suspicious lesions.  Musculoskeletal: Negative for arthritis, joint pain, muscle cramps, myalgias, neck pain and stiffness.  Gastrointestinal: Negative for abdominal pain, bowel  incontinence, diarrhea and excessive appetite.  Genitourinary: Negative for decreased libido, genital sores and incomplete emptying.  Neurological: Negative for brief paralysis, focal weakness, headaches and loss of balance.  Psychiatric/Behavioral: Negative for altered mental status, depression and suicidal ideas.  Allergic/Immunologic: Negative for HIV exposure and persistent infections.    EKGs/Labs/Other Studies Reviewed:    The following studies were reviewed today:   EKG: None today  Head CT in December 2020 no emergent large vessel occlusion. Stable infundibulum of the left posterior communicating artery. 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 Atherosclerotic calcification of the cavernous right internal carotid artery without significant stenosis or change.  Head/neck MRI MRA: There are scattered small acute infarcts in the posterior left MCA territory, including an area of acute on chronic ischemia at the posterior left sylvian fissure. No acute hemorrhage or mass-effect. Superimposed solitary small cortical infarct in the posterior right PCA or MCA/PCA watershed area. Given the absence of other acute ischemia this may be more likely synchronous small vessel disease rather than a recent embolic event. A small scattered chronic cortical infarcts are identified, in addition to  multiple chronic lacunar infarcts in the bilateral cerebellum and left thalamus. Negative neck MRA.  Echocardiogram done on March 07, 2019: Mild concentric left ventricle hypertrophy. There is normal global left ventricular contractility. Overall left ventricular systolic function is normal with an EF of 55 to 60%. The diastolic filling pattern indicates impaired relaxation. No regional wall motion abnormalities were noted. Right ventricle is normal in size and function. Left atrium is normal in size and volume. Right atrium is normal in size and function. Interatrial septum and interventricular septum is intact. Aortic valve is trileaflet with no stenosis or regurgitation. Mitral valve is normal. No regurgitation. Tricuspid valve has trace regurgitation. RVSP 22 mmHg. Pulmonary valve is normal no regurgitation. Aortic root, ascending aorta and aortic arch appear normal. Pericardium is normal. There is no pericardial effusion.  TTE [facility] 07/27/2019 FINDINGS Procedure:A TEE was performed in the location listed above. I certify I was present in compliance with HCFA regulations.  The patient was monitored by a nurse in attendance throughout the procedure.   There were no TEE related complications.   The patient was under general anesthesia throughout the procedure. The probe was successfully passed by me. Study quality:This was a technically difficult study with suboptimal views. Left Ventricle:The left ventricle size is normal.   Overall left ventricular systolic function is normal with, an EF between 55 - 60 %. Left Atrium:The left atrium is normal in size and function.   The is a small mobile mass in the left atrial appendage ( clip 14 and 15, at 84 degrees) highly suspicious for a clot. Right Ventricle:The right ventricle is normal in size and function, but not well visualized. Right Atrium:The right atrium was not well visualized.    There is a 0.8cm patent foramen ovale  noted in the interatrial septum with positive color flow. Agitated saline study was positive. Aortic Valve:The aortic valve is trileaflet, and appears structurally normal. No aortic stenosis or regurgitation.   The aortic valve is trileaflet and appears structurally normal.   There is no evidence of aortic regurgitation.   There is no evidence of aortic stenosis. Mitral Valve:The mitral valve is normal.   There is trace mitral regurgitation. Tricuspid Valve:The tricuspid valve appears structurally normal. Pulmonic Valve:The pulmonic valve is normal.   There is no pulmonic regurgitation present. Aorta:Non complex (< 47mm), atherosclerotic plaque(s) located in  the ascending and descending aorta. Pulmonary Artery:The pulmonic artery is not well visualized. Pulmonary Veins:Unable to  visualized the right pulmonary vein. The left pulmonary vein upper and lower not well visualized. Pericardium:There is no pericardial effusion.  CONCLUSIONS ----------- 1. This was a technically difficult study with suboptimal views. 2. The left ventricle size is normal. 3. Overall left ventricular systolic function is normal with, an EF between 55 - 60 %. 4. The is a small mobile mass in the left atrial appendage ( clip 14 and 15, at 84 degrees) highly suspicious for a clot. 5. The right ventricle is normal in size and function, but not well visualized. 6. The right atrium was not well visualized. 7. There is a 0.8cm patent foramen ovale noted in the interatrial septum with positive color flow. Agitated saline study was positive. 8. The aortic valve is trileaflet and appears structurally normal. 9. There is no evidence of aortic regurgitation. 10. There is no evidence of aortic stenosis. 11. The mitral valve is normal. 12. There is trace mitral regurgitation. 13. The tricuspid valve appears structurally normal. 14. The pulmonic valve is normal. 15. There is no pulmonic regurgitation present. 16. Non complex (<  4mm), atherosclerotic plaque(s) located in the ascending and descending aorta. 17. The pulmonic artery is normal. 18. The pulmonary veins were not recorded. 19. There is no pericardial effusion.  CardiaC CT IMPRESSION: 1. Coronary calcium score of 361. This was 79 percentile for age and sex matched control.  2. Normal coronary origin with right dominance.  3. 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 Consider preventive therapy and risk factormodification. Additional analysis with CT FFR will be submitted.  4. Normal left atrial appendage with two lobes and no evidence for a thrombus.   Recent Labs: 07/28/2019: Hemoglobin 14.4; Platelets 311 08/31/2019: BUN 19; Creatinine, Ser 1.32; Potassium 4.3; Sodium 131  Recent Lipid Panel    Component Value Date/Time   CHOL 146 10/14/2017 1033   TRIG 134 10/14/2017 1033   HDL 53 10/14/2017 1033   CHOLHDL 2.8 10/14/2017 1033   LDLCALC 66 10/14/2017 1033    Physical Exam:    VS:  BP 110/60   Pulse 74   Ht  (1.651 m)   Wt 181 lb (82.1 kg)   SpO2 96%   BMI 30.12 kg/m     Wt Readings from Last 3 Encounters:  10/10/19 181 lb (82.1 kg)  09/25/19 184 lb (83.5 kg)  09/25/19 183 lb (83 kg)     GEN: Well nourished, well developed in no acute distress HEENT: Normal NECK: No JVD; No carotid bruits LYMPHATICS: No lymphadenopathy CARDIAC: S1S2 noted,RRR, no murmurs, rubs, gallops RESPIRATORY:  Clear to auscultation without rales, wheezing or rhonchi  ABDOMEN: Soft, non-tender, non-distended, +bowel sounds, no guarding. EXTREMITIES: No edema, No cyanosis, no clubbing MUSCULOSKELETAL:  No deformity  SKIN: Warm and dry NEUROLOGIC:  Alert and oriented x 3, non-focal PSYCHIATRIC:  Normal affect, good insight  ASSESSMENT:    1. Cryptogenic stroke (HCC)   2. Essential hypertension   3. OSA (obstructive sleep apnea)   4. Mixed  hyperlipidemia   5. Coronary artery disease involving native coronary artery of native heart without angina pectoris    PLAN:    She appears to be to her baseline.  For now we will continue patient on her current medication regimen.  She is pending a loop recorder  placement.  I was able to speak with the EP team and they are working on this hopefully she can get this within the next 2 to 3 weeks.  Once this is reviewed in 2 to 3 months if there is no evidence of atrial fibrillation I think will be safe to transition her off the Eliquis to aspirin and Plavix.  In addition further discussions will be held if she will benefit from a PFO closure.  This is a shared decision with the patient, her daughter and myself.    No anginal symptoms.  Continue CPAP  The patient is in agreement with the above plan. The patient left the office in stable condition.  The patient will follow up in   Medication Adjustments/Labs and Tests Ordered: Current medicines are reviewed at length with the patient today.  Concerns regarding medicines are outlined above.  Orders Placed This Encounter  Procedures  . CBC with Differential/Platelet   No orders of the defined types were placed in this encounter.   Patient Instructions  Medication Instructions:  No medication changes. *If you need a refill on your cardiac medications before your next appointment, please call your pharmacy*   Lab Work: None ordered If you have labs (blood work) drawn today and your tests are completely normal, you will receive your results only by: Marland Kitchen MyChart Message (if you have MyChart) OR . A paper copy in the mail If you have any lab test that is abnormal or we need to change your treatment, we will call you to review the results.   Testing/Procedures: None ordered   Follow-Up: At [facility], you and your health needs are our priority.  As part of our continuing mission to provide you with exceptional heart care, we have  created designated Provider Care Teams.  These Care Teams include your primary Cardiologist (physician) and Advanced Practice Providers (APPs -  Physician Assistants and Nurse Practitioners) who all work together to provide you with the care you need, when you need it.  We recommend signing up for the patient portal called "MyChart".  Sign up information is provided on this After Visit Summary.  MyChart is used to connect with patients for Virtual Visits (Telemedicine).  Patients are able to view lab/test results, encounter notes, upcoming appointments, etc.  Non-urgent messages can be sent to your provider as well.   To learn more about what you can do with MyChart, go to [email or web address].    Your next appointment:   4 month(s)  The format for your next appointment:   In Person  Provider:   Thomasene Ripple, DO   Other Instructions NA     Adopting a Healthy Lifestyle.  Know what a healthy weight is for you (roughly BMI <25) and aim to maintain this   Aim for 7+ servings of fruits and vegetables daily   65-80+ fluid ounces of water or unsweet tea for healthy kidneys   Limit to max 1 drink of alcohol per day; avoid smoking/tobacco   Limit animal fats in diet for cholesterol and heart health - choose grass fed whenever available   Avoid highly processed foods, and foods high in saturated/trans fats   Aim for low stress - take time to unwind and care for your mental health   Aim for 150 min of moderate intensity exercise weekly for heart health, and weights twice weekly for bone health   Aim for 7-9 hours of sleep daily   When it comes to diets, agreement about  the perfect plan isnt easy to find, even among the experts. Experts at the [facility] [employer] developed an idea known as the Healthy Eating Plate. Just imagine a plate divided into logical, healthy portions.   The emphasis is on diet quality:   Load up on vegetables and fruits - one-half of your plate:  Aim for color and variety, and remember that potatoes dont count.   Go for whole grains - one-quarter of your plate: Whole wheat, barley, wheat berries, quinoa, oats, brown rice, and foods made with them. If you want pasta, go with whole wheat pasta.   Protein power - one-quarter of your plate: Fish, chicken, beans, and nuts are all healthy, versatile protein sources. Limit red meat.   The diet, however, does go beyond the plate, offering a few other suggestions.   Use healthy plant oils, such as olive, canola, soy, corn, sunflower and peanut. Check the labels, and avoid partially hydrogenated oil, which have unhealthy trans fats.   If youre thirsty, drink water. Coffee and tea are good in moderation, but skip sugary drinks and limit milk and dairy products to one or two daily servings.   The type of carbohydrate in the diet is more important than the amount. Some sources of carbohydrates, such as vegetables, fruits, whole grains, and beans-are healthier than others.   Finally, stay active  Signed, Thomasene Ripple, DO  10/10/2019 2:34 PM    [facility]

## 2019-10-11 LAB — CBC WITH DIFFERENTIAL/PLATELET
Basophils Absolute: 0.1 10*3/uL (ref 0.0–0.2)
Basos: 1 %
EOS (ABSOLUTE): 0.3 10*3/uL (ref 0.0–0.4)
Eos: 3 %
Hematocrit: 39.5 % (ref 34.0–46.6)
Hemoglobin: 13.4 g/dL (ref 11.1–15.9)
Immature Grans (Abs): 0 10*3/uL (ref 0.0–0.1)
Immature Granulocytes: 0 %
Lymphocytes Absolute: 1.4 10*3/uL (ref 0.7–3.1)
Lymphs: 14 %
MCH: 33.3 pg — ABNORMAL HIGH (ref 26.6–33.0)
MCHC: 33.9 g/dL (ref 31.5–35.7)
MCV: 98 fL — ABNORMAL HIGH (ref 79–97)
Monocytes Absolute: 0.7 10*3/uL (ref 0.1–0.9)
Monocytes: 7 %
Neutrophils Absolute: 7.6 10*3/uL — ABNORMAL HIGH (ref 1.4–7.0)
Neutrophils: 75 %
Platelets: 279 10*3/uL (ref 150–450)
RBC: 4.03 x10E6/uL (ref 3.77–5.28)
RDW: 12 % (ref 11.7–15.4)
WBC: 10.1 10*3/uL (ref 3.4–10.8)

## 2019-10-13 ENCOUNTER — Other Ambulatory Visit: Payer: Self-pay | Admitting: Cardiology

## 2019-10-30 ENCOUNTER — Other Ambulatory Visit: Payer: Self-pay

## 2019-10-30 ENCOUNTER — Encounter: Payer: Self-pay | Admitting: Cardiology

## 2019-10-30 ENCOUNTER — Ambulatory Visit: Payer: Medicare Other | Admitting: Cardiology

## 2019-10-30 VITALS — BP 110/74 | HR 75 | Ht 65.0 in | Wt 181.0 lb

## 2019-10-30 DIAGNOSIS — I639 Cerebral infarction, unspecified: Secondary | ICD-10-CM

## 2019-10-30 NOTE — Patient Instructions (Signed)
Medication Instructions:  Your physician recommends that you continue on your current medications as directed. Please refer to the Current Medication list given to you today.  *If you need a refill on your cardiac medications before your next appointment, please call your pharmacy*   Lab Work: None ordered.  If you have labs (blood work) drawn today and your tests are completely normal, you will receive your results only by: Marland Kitchen MyChart Message (if you have MyChart) OR . A paper copy in the mail If you have any lab test that is abnormal or we need to change your treatment, we will call you to review the results.   Testing/Procedures:  Wound Care, Adult Taking care of your wound properly can help to prevent pain, infection, and scarring. It can also help your wound to heal more quickly. How to care for your wound Wound care   Follow instructions from your health care provider about how to take care of your wound. Make sure you: ? Wash your hands with soap and water before you change the bandage (dressing). If soap and water are not available, use hand sanitizer. ? Change your dressing as told by your health care provider. ?  adhesive strips in place. These skin closures may need to stay in place for 2 weeks or longer. If adhesive strip edges start to loosen and curl up, you may trim the loose edges. Do not remove adhesive strips completely unless your health care provider tells you to do that.  Check your wound area every day for signs of infection. Check for: ? Redness, swelling, or pain. ? Fluid or blood. ? Warmth. ? Pus or a bad smell.  Ask your health care provider if you should clean the wound with mild soap and water. Doing this may include: ? Using a clean towel to pat the wound dry after cleaning it. Do not rub or scrub the wound. ? Applying a cream or ointment. Do this only as told by your health care provider. ? Covering the incision with a clean dressing.  Ask your  health care provider when you can leave the wound uncovered.  Keep the dressing dry until your health care provider says it can be removed. Do not take baths, swim, use a hot tub, or do anything that would put the wound underwater until your health care provider approves. Ask your health care provider if you can take showers. You may only be allowed to take sponge baths. Medicines   Take over-the-counter and prescription medicines only as told by your health care provider. If you were prescribed pain medicine, take it 30 or more minutes before you do any wound care or as told by your health care provider. General instructions  Return to your normal activities as told by your health care provider. Ask your health care provider what activities are safe.  Do not scratch or pick at the wound.  Do not use any products that contain nicotine or tobacco, such as cigarettes and e-cigarettes. These may delay wound healing. If you need help quitting, ask your health care provider.  Keep all follow-up visits as told by your health care provider. This is important.  Eat a diet that includes protein, vitamin A, vitamin C, and other nutrient-rich foods to help the wound heal. ? Foods rich in protein include meat, dairy, beans, nuts, and other sources. ? Foods rich in vitamin A include carrots and dark green, leafy vegetables. ? Foods rich in vitamin C include citrus, tomatoes,  and other fruits and vegetables. ? Nutrient-rich foods have protein, carbohydrates, fat, vitamins, or minerals. Eat a variety of healthy foods including vegetables, fruits, and whole grains. Contact a health care provider if:  You received a tetanus shot and you have swelling, severe pain, redness, or bleeding at the injection site.  Your pain is not controlled with medicine.  You have redness, swelling, or pain around the wound.  You have fluid or blood coming from the wound.  Your wound feels warm to the touch.  You have  pus or a bad smell coming from the wound.  You have a fever or chills.  You are nauseous or you vomit.  You are dizzy. Get help right away if:  You have a red streak going away from your wound.  The edges of the wound open up and separate.  Your wound is bleeding, and the bleeding does not stop with gentle pressure.  You have a rash.  You faint.  You have trouble breathing. Summary  Always wash your hands with soap and water before changing your bandage (dressing).  To help with healing, eat foods that are rich in protein, vitamin A, vitamin C, and other nutrients.  Check your wound every day for signs of infection. Contact your health care provider if you suspect that your wound is infected. This information is not intended to replace advice given to you by your health care provider. Make sure you discuss any questions you have with your health care provider. Document Revised: 07/04/2018 Document Reviewed: 10/01/2015 Elsevier Patient Education  2020 [employer].    Follow-Up: At [facility], you and your health needs are our priority.  As part of our continuing mission to provide you with exceptional heart care, we have created designated Provider Care Teams.  These Care Teams include your primary Cardiologist (physician) and Advanced Practice Providers (APPs -  Physician Assistants and Nurse Practitioners) who all work together to provide you with the care you need, when you need it.  We recommend signing up for the patient portal called "MyChart".  Sign up information is provided on this After Visit Summary.  MyChart is used to connect with patients for Virtual Visits (Telemedicine).  Patients are able to view lab/test results, encounter notes, upcoming appointments, etc.  Non-urgent messages can be sent to your provider as well.   To learn more about what you can do with MyChart, go to [email or web address].    Your next appointment:   Thursday August 12,2021 at  2pm.   Other Instructions You may remove large bandage tomorrow.  No showering or water submersion for 3 days.  Leave the steri-strips on until you return for your appointment.

## 2019-10-30 NOTE — Progress Notes (Signed)
Electrophysiology Office Note   Date:  10/30/2019   ID:  Chloe Gray, DOB [birth year], MRN [id number]  PCP:  Hal Morales, NP  Cardiologist: Servando Salina Primary Electrophysiologist:  Arnola Crittendon Jorja Loa, MD    Chief Complaint: CVA   History of Present Illness: Chloe Gray is a 69 y.o. female who is being seen today for the evaluation of CVA at the request of Hal Morales, NP. Presenting today for electrophysiology evaluation.  She has a history significant for hypertension, hyperlipidemia, and multiple CVAs.  Her most recent CVA was December 2020.  She also has chronic diastolic heart failure.  She has had an echo with no major abnormality and wore a cardiac monitor that showed no arrhythmias that would put her at risk of thromboembolism.  She had a cardiac CT that showed no evidence of thrombus in her left atrial appendage.  Today, denies symptoms of palpitations, chest pain, shortness of breath, orthopnea, PND, lower extremity edema, claudication, dizziness, presyncope, syncope, bleeding, or neurologic sequela. The patient is tolerating medications without difficulties.     Past Medical History:  Diagnosis Date  . Anxiety   . Arthritis   . Asthma    PFT 12/12  PCP on chart  . Cerebral aneurysm without rupture 10/14/2017  . Cerebrovascular accident (CVA) (HCC) 07/11/2019  . Depression    states well controlled  . Essential hypertension 07/11/2019  . GERD (gastroesophageal reflux disease)   . H/O hiatal hernia   . Hypercholesterolemia   . Hypertension    clearance note with OV Dr Lorin Picket, EKG on chart 03/09/11  . MCI (mild cognitive impairment) 10/14/2017  . Mixed hyperlipidemia 07/11/2019  . OSA (obstructive sleep apnea) 07/11/2019  . Osteoarthritis of right knee 05/01/2011  . Pneumonia   . Shortness of breath    with asthma  . Sleep apnea   . Stroke (HCC)   . 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 Vitamin B 12 deficiency    Past Surgical History:  Procedure Laterality  Date  . COLONOSCOPY    . KNEE ARTHROSCOPY    . TOTAL KNEE ARTHROPLASTY  04/30/2011   Procedure: TOTAL KNEE ARTHROPLASTY;  Surgeon: Javier Docker, MD;  Location: [facility];  Service: Orthopedics;  Laterality: Right;  Femoral nerve block  . TUBAL LIGATION    . UMBILICAL HERNIA REPAIR       Current Outpatient Medications  Medication Sig Dispense Refill  . albuterol (PROVENTIL HFA;VENTOLIN HFA) 108 (90 BASE) MCG/ACT inhaler Inhale 2 puffs into the lungs every 6 (six) hours as needed. For shortness of breath.    Marland Kitchen albuterol (PROVENTIL) (2.5 MG/3ML) 0.083% nebulizer solution Take 2.5 mg by nebulization every 6 (six) hours as needed. For shortness of breath.    . ALPRAZolam (XANAX) 1 MG tablet Take 1 mg by mouth 3 (three) times daily with meals.     Marland Kitchen amLODipine (NORVASC) 10 MG tablet Take 10 mg by mouth daily.    Marland Kitchen buPROPion (WELLBUTRIN XL) 150 MG 24 hr tablet Take 150 mg by mouth daily.    . citalopram (CELEXA) 10 MG tablet Take 30 mg by mouth at bedtime.    . cyanocobalamin (,VITAMIN B-12,) 1000 MCG/ML injection Inject 100 mcg into the muscle every 30 (thirty) days.    Marland Kitchen ELIQUIS 5 MG TABS tablet TAKE 1 TABLET BY MOUTH TWICE A DAY 180 tablet 2  . famotidine (PEPCID) 40 MG tablet Take 40 mg by mouth daily.    . furosemide (LASIX) 20 MG tablet  TAKE ONE TABLET DAILY AS NEEDED FOR SWELLING OF LEGS  1  . lamoTRIgine (LAMICTAL) 25 MG tablet Take 1 tablet by mouth daily.    Marland Kitchen lisinopril (ZESTRIL) 40 MG tablet Take 40 mg by mouth daily.    . montelukast (SINGULAIR) 10 MG tablet Take 10 mg by mouth daily.    . rosuvastatin (CRESTOR) 10 MG tablet Take by mouth.    . sodium bicarbonate 650 MG tablet Take 1,300 mg by mouth 2 (two) times daily.  3  . traZODone (DESYREL) 100 MG tablet Take 100 mg by mouth at bedtime as needed.      No current facility-administered medications for this visit.    Allergies:   Latex   Social History:  The patient  reports that she has never smoked. She has never used  smokeless tobacco. She reports that she does not drink alcohol and does not use drugs.   Family History:  The patient's family history includes Hypertension in her brother, father, mother, sister, sister, and sister; Stroke in her mother.    ROS:  Please see the history of present illness.   Otherwise, review of systems is positive for none.   All other systems are reviewed and negative.   PHYSICAL EXAM: VS:  BP 110/74   Pulse 75   Ht 5\' 5"  (1.651 m)   Wt 181 lb (82.1 kg)   SpO2 98%   BMI 30.12 kg/m  , BMI Body mass index is 30.12 kg/m. GEN: Well nourished, well developed, in no acute distress  HEENT: normal  Neck: no JVD, carotid bruits, or masses Cardiac: RRR; no murmurs, rubs, or gallops,no edema  Respiratory:  clear to auscultation bilaterally, normal work of breathing GI: soft, nontender, nondistended, + BS MS: no deformity or atrophy  Skin: warm and dry Neuro:  Strength and sensation are intact Psych: euthymic mood, full affect  EKG:  EKG is not ordered today. Personal review of the ekg ordered 09/25/19 shows SR, rate 77   Recent Labs: 08/31/2019: BUN 19; Creatinine, Ser 1.32; Potassium 4.3; Sodium 131 10/10/2019: Hemoglobin 13.4; Platelets 279    Lipid Panel     Component Value Date/Time   CHOL 146 10/14/2017 1033   TRIG 134 10/14/2017 1033   HDL 53 10/14/2017 1033   CHOLHDL 2.8 10/14/2017 1033   LDLCALC 66 10/14/2017 1033     Wt Readings from Last 3 Encounters:  10/30/19 181 lb (82.1 kg)  10/10/19 181 lb (82.1 kg)  09/25/19 184 lb (83.5 kg)      Other studies Reviewed: Additional studies/ records that were reviewed today include: TTE 03/06/19  Review of the above records today demonstrates:  Mild concentric LVH Ejection fraction 55 to 60% No cardiac thrombus  Cardiac monitor 08/29/2019 personally reviewed No ventricular tachycardia, no pauses, No second or third degree  AV block , no supraventricular tachycardia and no atrial fibrillation present. 3   patient triggered events associated with sinus rhtythm  ASSESSMENT AND PLAN:  1.  Cryptogenic stroke: Echo, cardiac monitor, coronary CT without evidence of thrombus.  Plan for Linq monitor implant.  Risks and benefits were discussed risk of bleeding and infection.  The patient understands the risks and is agreed to the procedure.    2.  Hypertension: well controlled  3.  Obstructive sleep apnea: CPAP compliance   Current medicines are reviewed at length with the patient today.   The patient does not have concerns regarding her medicines.  The following changes were made today:  none  Labs/ tests ordered today include:  No orders of the defined types were placed in this encounter.    Disposition:   FU with Bayleigh Loflin Pending LINQ results  Signed, Fanchon Papania Jorja Loa, MD  10/30/2019 12:11 PM     [facility] [address] [place] [address] [phone] (office) [phone] (fax)  SURGEON:  Loman Brooklyn, MD     PREPROCEDURE DIAGNOSIS:  Cryptogenic Stroke    POSTPROCEDURE DIAGNOSIS:  Cryptogenic Stroke     PROCEDURES:   1. Implantable loop recorder implantation    INTRODUCTION:  Chloe Gray is a 69 y.o. female with a history of unexplained stroke who presents today for implantable loop implantation.  The patient has had a cryptogenic stroke.  Despite an extensive workup by neurology, no reversible causes have been identified.  she has worn telemetry during which she did not have arrhythmias.  There is significant concern for possible atrial fibrillation as the cause for the patients stroke.  The patient therefore presents today for implantable loop implantation.     DESCRIPTION OF PROCEDURE:  Informed written consent was obtained, and the patient was brought to the electrophysiology lab in a fasting state.  The patient required no sedation for the procedure today.  Mapping over the patient's chest was performed by the EP lab staff to identify the  area where electrograms were most prominent for ILR recording.  This area was found to be the left parasternal region over the 3rd-4th intercostal space. The patients left chest was therefore prepped and draped in the usual sterile fashion by the EP lab staff. The skin overlying the left parasternal region was infiltrated with lidocaine for local analgesia.  A 0.5-cm incision was made over the left parasternal region over the 3rd intercostal space.  A subcutaneous ILR pocket was fashioned using a combination of sharp and blunt dissection.  A Medtronic Reveal Linq model [place] [id number] implantable loop recorder was then placed into the pocket  R waves were very prominent and measured 0.63mV. EBL<1 ml.  Steri- Strips and a sterile dressing were then applied.  There were no early apparent complications.     CONCLUSIONS:   1. Successful implantation of a Medtronic Reveal LINQ implantable loop recorder for cryptogenic stroke  2. No early apparent complications.

## 2019-11-09 ENCOUNTER — Ambulatory Visit: Payer: Medicare Other

## 2019-11-23 ENCOUNTER — Telehealth (INDEPENDENT_AMBULATORY_CARE_PROVIDER_SITE_OTHER): Payer: Medicare Other | Admitting: Emergency Medicine

## 2019-11-23 DIAGNOSIS — I639 Cerebral infarction, unspecified: Secondary | ICD-10-CM

## 2019-11-24 ENCOUNTER — Other Ambulatory Visit: Payer: Self-pay

## 2019-11-24 NOTE — Progress Notes (Signed)
Patient verbally consented to ILR wound check via virtual visit due to Covid-19.  Virtual ILR wound check. Steri strips removed by patient. Wound well healed. Home monitor transmitting nightly. No episodes. Patient and daughter educated about wound care and Carelink monitor. Monthly summary reports and ROV with Dr. Elberta Fortis PRN.  Patient gave verbal permission to include the following photo:

## 2019-12-01 ENCOUNTER — Ambulatory Visit (INDEPENDENT_AMBULATORY_CARE_PROVIDER_SITE_OTHER): Payer: Medicare Other | Admitting: *Deleted

## 2019-12-01 DIAGNOSIS — I639 Cerebral infarction, unspecified: Secondary | ICD-10-CM

## 2019-12-03 LAB — CUP PACEART REMOTE DEVICE CHECK
Date Time Interrogation Session: 20210904125919
Implantable Pulse Generator Implant Date: 20210802

## 2019-12-06 NOTE — Progress Notes (Signed)
Carelink Summary Report / Loop Recorder 

## 2019-12-08 ENCOUNTER — Other Ambulatory Visit: Payer: Self-pay

## 2019-12-08 ENCOUNTER — Ambulatory Visit
Admission: RE | Admit: 2019-12-08 | Discharge: 2019-12-08 | Disposition: A | Discharge status: Home / Self Care | Payer: Self-pay | Source: Ambulatory Visit | Attending: Cardiovascular Disease | Admitting: Cardiovascular Disease

## 2019-12-08 DIAGNOSIS — Q2112 Patent foramen ovale: Secondary | ICD-10-CM

## 2020-01-03 ENCOUNTER — Ambulatory Visit (INDEPENDENT_AMBULATORY_CARE_PROVIDER_SITE_OTHER): Payer: Medicare Other

## 2020-01-03 DIAGNOSIS — I639 Cerebral infarction, unspecified: Secondary | ICD-10-CM | POA: Diagnosis not present

## 2020-01-04 LAB — CUP PACEART REMOTE DEVICE CHECK
Date Time Interrogation Session: 20211005230135
Implantable Pulse Generator Implant Date: 20210802

## 2020-01-08 NOTE — Progress Notes (Signed)
Carelink Summary Report / Loop Recorder 

## 2020-01-09 ENCOUNTER — Telehealth: Payer: Self-pay | Admitting: Cardiovascular Disease

## 2020-01-11 NOTE — Telephone Encounter (Signed)
Note created in error.

## 2020-01-15 ENCOUNTER — Telehealth: Payer: Self-pay

## 2020-01-15 NOTE — Telephone Encounter (Signed)
Dr. Excell Seltzer has reviewed Linq info and echo imaging.  Per Dr. Excell Seltzer, PFO is large. Will bring the patient in for evaluation with Dr. Excell Seltzer prior to scheduling closure.  Left message to call back.

## 2020-01-17 NOTE — Telephone Encounter (Signed)
Scheduled the patient with Dr. Excell Seltzer 02/12/2020 to further discuss PFO plan. Confirmed with the patient and her daughter who will drive her, Joni. They were grateful for call and agree with treatment plan.

## 2020-01-17 NOTE — Telephone Encounter (Signed)
Reilly is returning [employer].

## 2020-02-05 ENCOUNTER — Ambulatory Visit (INDEPENDENT_AMBULATORY_CARE_PROVIDER_SITE_OTHER): Payer: Medicare Other

## 2020-02-05 DIAGNOSIS — I639 Cerebral infarction, unspecified: Secondary | ICD-10-CM

## 2020-02-05 LAB — CUP PACEART REMOTE DEVICE CHECK
Date Time Interrogation Session: 20211107230504
Implantable Pulse Generator Implant Date: 20210802

## 2020-02-06 NOTE — Progress Notes (Signed)
Carelink Summary Report / Loop Recorder 

## 2020-02-12 ENCOUNTER — Ambulatory Visit: Payer: Medicare Other | Admitting: Cardiology

## 2020-02-12 ENCOUNTER — Ambulatory Visit: Payer: Medicare Other | Admitting: Cardiovascular Disease

## 2020-02-12 ENCOUNTER — Encounter: Payer: Self-pay | Admitting: Cardiovascular Disease

## 2020-02-12 ENCOUNTER — Other Ambulatory Visit: Payer: Self-pay

## 2020-02-12 VITALS — BP 118/76 | HR 81 | Ht 65.0 in | Wt 177.4 lb

## 2020-02-12 DIAGNOSIS — Q2112 Patent foramen ovale: Secondary | ICD-10-CM

## 2020-02-12 DIAGNOSIS — Q211 Atrial septal defect: Secondary | ICD-10-CM | POA: Diagnosis not present

## 2020-02-12 NOTE — Progress Notes (Signed)
Cardiology Office Note:    Date:  02/12/2020   ID:  [name], DOB 10/11/1950, MRN [id number]  PCP:  Hal Morales, NP  [facility] Cardiologist:  Thomasene Ripple, DO  [facility] Electrophysiologist:  None   Referring MD: Hal Morales, NP   Chief Complaint  Patient presents with  . PFO    History of Present Illness:    Chloe Gray is a 69 y.o. female with a hx of stroke, presenting for follow-up evaluation.  I initially saw her September 25, 2019 for consideration of transcatheter PFO closure.  She has had multiple strokes with residual deficits of memory impairment, speech problems, and gait instability.  She has not been demonstrated to have any large vessel occlusive disease.  She has not yet been found to have atrial fibrillation, but she has undergone loop recorder implant in September of this year.  A transesophageal echocardiogram showed a moderate to large PFO with positive bubble study.  The patient returns today for follow-up evaluation.  She is with her daughter.  She has been doing fairly well and reports no change in any of her symptoms.  She denies shortness of breath, chest pain, leg swelling, lightheadedness, heart palpitations, or syncope.  Past Medical History:  Diagnosis Date  . Anxiety   . Arthritis   . Asthma    PFT 12/12  PCP on chart  . Cerebral aneurysm without rupture 10/14/2017  . Cerebrovascular accident (CVA) (HCC) 07/11/2019  . Depression    states well controlled  . Essential hypertension 07/11/2019  . GERD (gastroesophageal reflux disease)   . H/O hiatal hernia   . Hypercholesterolemia   . Hypertension    clearance note with OV Dr Lorin Picket, EKG on chart 03/09/11  . MCI (mild cognitive impairment) 10/14/2017  . Mixed hyperlipidemia 07/11/2019  . OSA (obstructive sleep apnea) 07/11/2019  . Osteoarthritis of right knee 05/01/2011  . Pneumonia   . Shortness of breath    with asthma  . Sleep apnea   . Stroke (HCC)   . Thalamic infarct, acute (HCC)  10/14/2017  . Vitamin B 12 deficiency     Past Surgical History:  Procedure Laterality Date  . COLONOSCOPY    . KNEE ARTHROSCOPY    . TOTAL KNEE ARTHROPLASTY  04/30/2011   Procedure: TOTAL KNEE ARTHROPLASTY;  Surgeon: Javier Docker, MD;  Location: [facility];  Service: Orthopedics;  Laterality: Right;  Femoral nerve block  . TUBAL LIGATION    . UMBILICAL HERNIA REPAIR      Current Medications: Current Meds  Medication Sig  . albuterol (PROVENTIL HFA;VENTOLIN HFA) 108 (90 BASE) MCG/ACT inhaler Inhale 2 puffs into the lungs every 6 (six) hours as needed. For shortness of breath.  Marland Kitchen albuterol (PROVENTIL) (2.5 MG/3ML) 0.083% nebulizer solution Take 2.5 mg by nebulization every 6 (six) hours as needed. For shortness of breath.  . ALPRAZolam (XANAX) 1 MG tablet Take 1 mg by mouth 3 (three) times daily with meals.   Marland Kitchen amLODipine (NORVASC) 10 MG tablet Take 10 mg by mouth daily.  Marland Kitchen buPROPion (WELLBUTRIN XL) 150 MG 24 hr tablet Take 150 mg by mouth daily.  . citalopram (CELEXA) 10 MG tablet Take 30 mg by mouth at bedtime.  . cyanocobalamin (,VITAMIN B-12,) 1000 MCG/ML injection Inject 100 mcg into the muscle every 30 (thirty) days.  Marland Kitchen ELIQUIS 5 MG TABS tablet TAKE 1 TABLET BY MOUTH TWICE A DAY  . famotidine (PEPCID) 40 MG tablet Take 40 mg by mouth daily.  Marland Kitchen  furosemide (LASIX) 20 MG tablet TAKE ONE TABLET DAILY AS NEEDED FOR SWELLING OF LEGS  . lisinopril (ZESTRIL) 40 MG tablet Take 40 mg by mouth daily.  . montelukast (SINGULAIR) 10 MG tablet Take 10 mg by mouth daily.  . rosuvastatin (CRESTOR) 10 MG tablet Take by mouth.  . sodium bicarbonate 650 MG tablet Take 1,300 mg by mouth 2 (two) times daily.  . traZODone (DESYREL) 100 MG tablet Take 100 mg by mouth at bedtime as needed.   . [DISCONTINUED] lamoTRIgine (LAMICTAL) 25 MG tablet Take 1 tablet by mouth daily.     Allergies:   Latex   Social History   Socioeconomic History  . Marital status: Married    Spouse name: Not on file  .  Number of children: Not on file  . Years of education: Not on file  . Highest education level: Not on file  Occupational History  . Not on file  Tobacco Use  . Smoking status: Never Smoker  . Smokeless tobacco: Never Used  Vaping Use  . Vaping Use: Never used  Substance and Sexual Activity  . Alcohol use: No  . Drug use: No  . Sexual activity: Not on file  Other Topics Concern  . Not on file  Social History Narrative  . Not on file   Social Determinants of Health   Financial Resource Strain:   . Difficulty of Paying Living Expenses: Not on file  Food Insecurity:   . Worried About Programme researcher, broadcasting/film/videounning Out of Food in the Last Year: Not on file  . Ran Out of Food in the Last Year: Not on file  Transportation Needs:   . Lack of Transportation (Medical): Not on file  . Lack of Transportation (Non-Medical): Not on file  Physical Activity:   . Days of Exercise per Week: Not on file  . Minutes of Exercise per Session: Not on file  Stress:   . Feeling of Stress : Not on file  Social Connections:   . Frequency of Communication with Friends and Family: Not on file  . Frequency of Social Gatherings with Friends and Family: Not on file  . Attends Religious Services: Not on file  . Active Member of Clubs or Organizations: Not on file  . Attends BankerClub or Organization Meetings: Not on file  . Marital Status: Not on file     Family History: The patient's family history includes Hypertension in her brother, father, mother, sister, sister, and sister; Stroke in her mother.  ROS:   Please see the history of present illness.    All other systems reviewed and are negative.  EKGs/Labs/Other Studies Reviewed:    The following studies were reviewed today: Gated coronary CTA 08/31/2019: Aorta: Normal size. Mild diffuse atherosclerotic plaque and calcifications. No dissection.  Aortic Valve:  Trileaflet.  Trivial calcifications.  Coronary Arteries:  Normal coronary origin.  Right dominance.  RCA  is a medium caliber dominant artery that gives rise to PDA and PLA. There is minimal plaque, PDA and PLA are poorly visualized.  Left main is a short artery that gives rise to LAD and LCX arteries. Distal left main has mild calcified plaque with stenosis 25-49%.  LAD is a large vessel that has mild long calcified plaque in the proximal segment with associated stenosis 25-49%. This is followed by a short intramyocardial bridge. Mid and distal LAD have only minimal plaque.  LCX is a small lumen non-dominant artery that gives rise to one small OM1 branch. There is mild to  moderate plaque. The lumen is too small to evaluate.  Other findings:  Normal pulmonary vein drainage into the left atrium.  Normal left atrial appendage with two lobes and no evidence for a thrombus.  Normal size of the pulmonary artery.  IMPRESSION: 1. Coronary calcium score of 361. This was 54 percentile for age and sex matched control.  2. Normal coronary origin with right dominance.  3. 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 Consider preventive therapy and risk factor modification. Additional analysis with CT FFR will be submitted.  4. Normal left atrial appendage with two lobes and no evidence for a thrombus.  TEE 07/27/2019: [facility] reviewed.  Normal LV size and systolic function with LVEF 55 to 60%.  There is concern for a small mobile mass in the left atrial appendage highly suspicious for thrombus, PFO is present measured at 0.8 cm with positive agitated saline study.  EKG:  EKG is ordered today.  The ekg ordered today demonstrates normal sinus rhythm 81 bpm, left axis deviation otherwise within normal limits.  Recent Labs: 08/31/2019: BUN 19; Creatinine, Ser 1.32; Potassium 4.3; Sodium 131 10/10/2019: Hemoglobin 13.4; Platelets 279  Recent Lipid Panel      Component Value Date/Time   CHOL 146 10/14/2017 1033   TRIG 134 10/14/2017 1033   HDL 53 10/14/2017 1033   CHOLHDL 2.8 10/14/2017 1033   LDLCALC 66 10/14/2017 1033     Risk Assessment/Calculations:       Physical Exam:    VS:  BP 118/76   Pulse 81   Ht 5\' 5"  (1.651 m)   Wt 177 lb 6.4 oz (80.5 kg)   SpO2 93%   BMI 29.52 kg/m     Wt Readings from Last 3 Encounters:  02/12/20 177 lb 6.4 oz (80.5 kg)  10/30/19 181 lb (82.1 kg)  10/10/19 181 lb (82.1 kg)     GEN:  Well nourished, well developed in no acute distress HEENT: Normal NECK: No JVD; No carotid bruits LYMPHATICS: No lymphadenopathy CARDIAC: RRR, no murmurs, rubs, gallops RESPIRATORY:  Clear to auscultation without rales, wheezing or rhonchi  ABDOMEN: Soft, non-tender, non-distended MUSCULOSKELETAL:  No edema; No deformity  SKIN: Warm and dry NEUROLOGIC:  Alert and oriented x 3, slow speech PSYCHIATRIC:  Normal affect   ASSESSMENT:    1. PFO (patent foramen ovale)    PLAN:    In order of problems listed above:  1. I have personally reviewed the patient's TEE images.  She does have a moderate to large PFO with color flow across the interatrial septum and a strongly positive bubble study.  We again discussed the potential relationship between PFO and cryptogenic strokes.  The patient is currently anticoagulated with apixaban.  She is tolerating this well and has had a loop recorder in place for a few months without demonstration of atrial dysrhythmia.  I reviewed several potential treatment options with her today, including ongoing medical therapy, changing from oral anticoagulation to antiplatelet therapy, or proceeding with transcatheter PFO closure.  After discussing multiple options, I think at this patient's age it makes sense to give her further time for cardiac monitoring.  There is a relatively higher likelihood that she might have paroxysmal atrial fibrillation and if that is the case she will be required  to continue with lifelong oral anticoagulation.  However, if after a period of 6 to 9 months  there is no atrial fibrillation demonstrated, it would be reasonable to proceed with transcatheter PFO closure as a means of secondary risk reduction for recurrent stroke.  I demonstrated the Amplatzer PFO occluder device to the patient and her daughter today.  We discussed the procedure in some detail.  I have recommended continuing with her current program which includes anticoagulation with apixaban and monthly review of her implantable loop recorder.  She will return in 4 to 6 months for follow-up and if no atrial dysrhythmias are identified, we will consider moving forward with PFO closure.  Shared Decision Making/Informed Consent      Will reach out to neurology colleagues to discuss management strategy further  Medication Adjustments/Labs and Tests Ordered: Current medicines are reviewed at length with the patient today.  Concerns regarding medicines are outlined above.  Orders Placed This Encounter  Procedures  . EKG 12-Lead   No orders of the defined types were placed in this encounter.   Patient Instructions  Dr. Excell Seltzer recommends you come back in April, 2022 for reevaluation.     Signed, Tonny Bollman, MD  02/12/2020 2:11 PM    [facility]

## 2020-02-12 NOTE — Patient Instructions (Addendum)
Dr. Excell Seltzer recommends you come back in April, 2022 for reevaluation.

## 2020-02-19 NOTE — Progress Notes (Signed)
Agree with plan 

## 2020-03-09 LAB — CUP PACEART REMOTE DEVICE CHECK
Date Time Interrogation Session: 20211210230227
Implantable Pulse Generator Implant Date: 20210802

## 2020-03-11 ENCOUNTER — Ambulatory Visit (INDEPENDENT_AMBULATORY_CARE_PROVIDER_SITE_OTHER): Payer: Medicare Other

## 2020-03-11 DIAGNOSIS — I639 Cerebral infarction, unspecified: Secondary | ICD-10-CM | POA: Diagnosis not present

## 2020-03-26 NOTE — Progress Notes (Signed)
Carelink Summary Report / Loop Recorder 

## 2020-03-28 ENCOUNTER — Telehealth: Payer: Self-pay | Admitting: Cardiology

## 2020-03-28 MED ORDER — APIXABAN 5 MG PO TABS
5.0000 mg | ORAL_TABLET | Freq: Two times a day (BID) | ORAL | 3 refills | Status: DC
Start: 2020-03-28 — End: 2020-09-05

## 2020-03-28 NOTE — Telephone Encounter (Signed)
Pt states that medicare had informed her that they will no longer pay for eloquis and she needs to switch to generic version. Please advise-  Best number to contact is- [phone] or [phone]   Thanks  kbl

## 2020-03-28 NOTE — Telephone Encounter (Signed)
Pts daughter is aware that the generic has been sent into the pharmacy.

## 2020-04-10 ENCOUNTER — Other Ambulatory Visit: Payer: Self-pay

## 2020-04-10 ENCOUNTER — Ambulatory Visit (INDEPENDENT_AMBULATORY_CARE_PROVIDER_SITE_OTHER): Payer: Medicare Other | Admitting: Cardiology

## 2020-04-10 ENCOUNTER — Encounter: Payer: Self-pay | Admitting: Cardiology

## 2020-04-10 VITALS — BP 128/70 | HR 72 | Ht 65.0 in | Wt 176.2 lb

## 2020-04-10 DIAGNOSIS — G4733 Obstructive sleep apnea (adult) (pediatric): Secondary | ICD-10-CM

## 2020-04-10 DIAGNOSIS — E8881 Metabolic syndrome: Secondary | ICD-10-CM

## 2020-04-10 DIAGNOSIS — I639 Cerebral infarction, unspecified: Secondary | ICD-10-CM | POA: Diagnosis not present

## 2020-04-10 DIAGNOSIS — I10 Essential (primary) hypertension: Secondary | ICD-10-CM | POA: Diagnosis not present

## 2020-04-10 DIAGNOSIS — E782 Mixed hyperlipidemia: Secondary | ICD-10-CM | POA: Diagnosis not present

## 2020-04-10 NOTE — Progress Notes (Signed)
Cardiology Office Note:    Date:  04/10/2020   ID:  [name], DOB 01/04/1951, MRN [id number]  PCP:  Hal Morales, NP  Cardiologist:  Thomasene Ripple, DO  Electrophysiologist:  None   Referring MD: Hal Morales, NP   " I am doing well"  History of Present Illness:    Chloe Gray is a 70 y.o. female with a hx of of hypertension, hyperlipidemia, multiple CVAs with the most recent CVA in December 2020 chronic diastolic heart failure.I did see the patient for the first time on July 11, 2019 at that time due to her strokes discuss ruling out cardiac embolic events.  We did do a TEE on this patient which showed 0.8 cm patent foramen ovale.  There was suspicion for left atrial appendage clot however CT was done which showed no evidence of clot.   She did wear a monitor for 14 days which showed no A. fib but due to history of cryptogenic stroke a loop recorder was more appropriate therefore I recommended patient see EP.  In the meantime I also asked the patient to see our structural clinic to be evaluated for PFO closure.  She is status post loop recorder placement on October 30, 2019 and her updated most recent device check on March 12, 2019 does not show any evidence of new atrial fibrillation.  Today she tells me the major problem for her is the fact that she has felt significantly fatigued but is still able to do her activities of daily living. No chest pain, no shortness of breath no recent symptoms of stroke.   Past Medical History:  Diagnosis Date  . Anxiety   . Arthritis   . Asthma    PFT 12/12  PCP on chart  . Cerebral aneurysm without rupture 10/14/2017  . Cerebrovascular accident (CVA) (HCC) 07/11/2019  . Depression    states well controlled  . Essential hypertension 07/11/2019  . GERD (gastroesophageal reflux disease)   . H/O hiatal hernia   . Hypercholesterolemia   . Hypertension    clearance note with OV Dr Lorin Picket, EKG on chart 03/09/11  . MCI (mild cognitive  impairment) 10/14/2017  . Mixed hyperlipidemia 07/11/2019  . OSA (obstructive sleep apnea) 07/11/2019  . Osteoarthritis of right knee 05/01/2011  . Pneumonia   . Shortness of breath    with asthma  . Sleep apnea   . Stroke (HCC)   . 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 Vitamin B 12 deficiency     Past Surgical History:  Procedure Laterality Date  . COLONOSCOPY    . KNEE ARTHROSCOPY    . TOTAL KNEE ARTHROPLASTY  04/30/2011   Procedure: TOTAL KNEE ARTHROPLASTY;  Surgeon: Javier Docker, MD;  Location: [facility];  Service: Orthopedics;  Laterality: Right;  Femoral nerve block  . TUBAL LIGATION    . UMBILICAL HERNIA REPAIR      Current Medications: Current Meds  Medication Sig  . albuterol (PROVENTIL HFA;VENTOLIN HFA) 108 (90 BASE) MCG/ACT inhaler Inhale 2 puffs into the lungs every 6 (six) hours as needed. For shortness of breath.  Marland Kitchen albuterol (PROVENTIL) (2.5 MG/3ML) 0.083% nebulizer solution Take 2.5 mg by nebulization every 6 (six) hours as needed. For shortness of breath.  . ALPRAZolam (XANAX) 1 MG tablet Take 1 mg by mouth 3 (three) times daily with meals.   Marland Kitchen amLODipine (NORVASC) 10 MG tablet Take 10 mg by mouth daily.  Marland Kitchen apixaban (ELIQUIS) 5 MG TABS tablet Take 1 tablet (  5 mg total) by mouth 2 (two) times daily.  Marland Kitchen buPROPion (WELLBUTRIN XL) 150 MG 24 hr tablet Take 150 mg by mouth daily.  . citalopram (CELEXA) 10 MG tablet Take 30 mg by mouth at bedtime.  . cyanocobalamin (,VITAMIN B-12,) 1000 MCG/ML injection Inject 100 mcg into the muscle every 30 (thirty) days.  . famotidine (PEPCID) 40 MG tablet Take 40 mg by mouth daily.  . furosemide (LASIX) 20 MG tablet TAKE ONE TABLET DAILY AS NEEDED FOR SWELLING OF LEGS  . lisinopril (ZESTRIL) 40 MG tablet Take 40 mg by mouth daily.  . montelukast (SINGULAIR) 10 MG tablet Take 10 mg by mouth daily.  . rosuvastatin (CRESTOR) 10 MG tablet Take by mouth.  . sodium bicarbonate 650 MG tablet Take 1,300 mg by mouth 2 (two) times daily.  .  traZODone (DESYREL) 100 MG tablet Take 100 mg by mouth at bedtime as needed.      Allergies:   Latex   Social History   Socioeconomic History  . Marital status: Married    Spouse name: Not on file  . Number of children: Not on file  . Years of education: Not on file  . Highest education level: Not on file  Occupational History  . Not on file  Tobacco Use  . Smoking status: Never Smoker  . Smokeless tobacco: Never Used  Vaping Use  . Vaping Use: Never used  Substance and Sexual Activity  . Alcohol use: No  . Drug use: No  . Sexual activity: Not on file  Other Topics Concern  . Not on file  Social History Narrative  . Not on file   Social Determinants of Health   Financial Resource Strain: Not on file  Food Insecurity: Not on file  Transportation Needs: Not on file  Physical Activity: Not on file  Stress: Not on file  Social Connections: Not on file     Family History: The patient's family history includes Hypertension in her brother, father, mother, sister, sister, and sister; Stroke in her mother.  ROS:   Review of Systems  Constitution: Negative for decreased appetite, fever and weight gain.  HENT: Negative for congestion, ear discharge, hoarse voice and sore throat.   Eyes: Negative for discharge, redness, vision loss in right eye and visual halos.  Cardiovascular: Negative for chest pain, dyspnea on exertion, leg swelling, orthopnea and palpitations.  Respiratory: Negative for cough, hemoptysis, shortness of breath and snoring.   Endocrine: Negative for heat intolerance and polyphagia.  Hematologic/Lymphatic: Negative for bleeding problem. Does not bruise/bleed easily.  Skin: Negative for flushing, nail changes, rash and suspicious lesions.  Musculoskeletal: Negative for arthritis, joint pain, muscle cramps, myalgias, neck pain and stiffness.  Gastrointestinal: Negative for abdominal pain, bowel incontinence, diarrhea and excessive appetite.  Genitourinary:  Negative for decreased libido, genital sores and incomplete emptying.  Neurological: Negative for brief paralysis, focal weakness, headaches and loss of balance.  Psychiatric/Behavioral: Negative for altered mental status, depression and suicidal ideas.  Allergic/Immunologic: Negative for HIV exposure and persistent infections.    EKGs/Labs/Other Studies Reviewed:    The following studies were reviewed today:   EKG: None today  Gated coronary CTA 08/31/2019: Aorta: Normal size. Mild diffuse atherosclerotic plaque and calcifications. No dissection.  Aortic Valve: Trileaflet. Trivial calcifications.  Coronary Arteries: Normal coronary origin. Right dominance.  RCA is a medium caliber dominant artery that gives rise to PDA and PLA. There is minimal plaque, PDA and PLA are poorly visualized.  Left main is a  short artery that gives rise to LAD and LCX arteries. Distal left main has mild calcified plaque with stenosis 25-49%.  LAD is a large vessel that has mild long calcified plaque in the proximal segment with associated stenosis 25-49%. This is followed by a short intramyocardial bridge. Mid and distal LAD have only minimal plaque.  LCX is a small lumen non-dominant artery that gives rise to one small OM1 branch. There is mild to moderate plaque. The lumen is too small to evaluate.  Other findings:  Normal pulmonary vein drainage into the left atrium.  Normal left atrial appendage with two lobes and no evidence for a thrombus.  Normal size of the pulmonary artery.  IMPRESSION: 1. Coronary calcium score of 361. This was 3 percentile for age and sex matched control.  2. Normal coronary origin with right dominance.  3. 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 Consider preventive therapy and risk factor modification. Additional analysis with CT FFR  will be submitted.  4. Normal left atrial appendage with two lobes and no evidence for a thrombus.  TEE 07/27/2019: [facility].  Normal LV size and systolic function with LVEF 55 to 60%.  There is concern for a small mobile mass in the left atrial appendage highly suspicious for thrombus, PFO is present measured at 0.8 cm with positive agitated saline study.  Recent Labs: 08/31/2019: BUN 19; Creatinine, Ser 1.32; Potassium 4.3; Sodium 131 10/10/2019: Hemoglobin 13.4; Platelets 279  Recent Lipid Panel    Component Value Date/Time   CHOL 146 10/14/2017 1033   TRIG 134 10/14/2017 1033   HDL 53 10/14/2017 1033   CHOLHDL 2.8 10/14/2017 1033   LDLCALC 66 10/14/2017 1033    Physical Exam:    VS:  BP 128/70   Pulse 72   Ht 5\' 5"  (1.651 m)   Wt 176 lb 3.2 oz (79.9 kg)   SpO2 93%   BMI 29.32 kg/m     Wt Readings from Last 3 Encounters:  04/10/20 176 lb 3.2 oz (79.9 kg)  02/12/20 177 lb 6.4 oz (80.5 kg)  10/30/19 181 lb (82.1 kg)     GEN: Well nourished, well developed in no acute distress HEENT: Normal NECK: No JVD; No carotid bruits LYMPHATICS: No lymphadenopathy CARDIAC: S1S2 noted,RRR, no murmurs, rubs, gallops RESPIRATORY:  Clear to auscultation without rales, wheezing or rhonchi  ABDOMEN: Soft, non-tender, non-distended, +bowel sounds, no guarding. EXTREMITIES: No edema, No cyanosis, no clubbing MUSCULOSKELETAL:  No deformity  SKIN: Warm and dry NEUROLOGIC:  Alert and oriented x 3, non-focal PSYCHIATRIC:  Normal affect, good insight  ASSESSMENT:    1. Metabolic syndrome   2. Essential hypertension   3. Cryptogenic stroke (HCC)   4. Mixed hyperlipidemia   5. OSA (obstructive sleep apnea)    PLAN:     From a cardiovascular standpoint she appears to be doing well.  No changes in medication at this time.  I reviewed her recent remote device check which was done December 2021 stable sinus rhythm no evidence of atrial fibrillation.  We will keep her on  the anticoagulation for Eliquis for now.  Hopefully after 6 months the patient will follow-up with structural team again for reassessment in terms of her PFO and strokes.  Blood pressure is acceptable, continue with current antihypertensive regimen.  Hyperlipidemia - continue with current statin medication.  In terms of her fatigue and  metabolic syndrome will get vitamin D levels as well as CBC today.  Continue with CPAP.  I  The patient is in agreement with the above plan. The patient left the office in stable condition.  The patient will follow up in   Medication Adjustments/Labs and Tests Ordered: Current medicines are reviewed at length with the patient today.  Concerns regarding medicines are outlined above.  Orders Placed This Encounter  Procedures  . Basic metabolic panel  . Magnesium  . CBC  . TSH  . VITAMIN D 25 Hydroxy (Vit-D Deficiency, Fractures)   No orders of the defined types were placed in this encounter.   Patient Instructions  Medication Instructions:  Your physician recommends that you continue on your current medications as directed. Please refer to the Current Medication list given to you today.  *If you need a refill on your cardiac medications before your next appointment, please call your pharmacy*   Lab Work: Your physician recommends that you return for lab work in: TODAY CBC, TSH, BMP, Mag If you have labs (blood work) drawn today and your tests are completely normal, you will receive your results only by: Marland Kitchen. MyChart Message (if you have MyChart) OR . A paper copy in the mail If you have any lab test that is abnormal or we need to change your treatment, we will call you to review the results.   Testing/Procedures: None   Follow-Up: At [facility], you and your health needs are our priority.  As part of our continuing mission to provide you with exceptional heart care, we have created designated Provider Care Teams.  These Care Teams  include your primary Cardiologist (physician) and Advanced Practice Providers (APPs -  Physician Assistants and Nurse Practitioners) who all work together to provide you with the care you need, when you need it.  We recommend signing up for the patient portal called "MyChart".  Sign up information is provided on this After Visit Summary.  MyChart is used to connect with patients for Virtual Visits (Telemedicine).  Patients are able to view lab/test results, encounter notes, upcoming appointments, etc.  Non-urgent messages can be sent to your provider as well.   To learn more about what you can do with MyChart, go to [email or web address].    Your next appointment:   6 month(s)  The format for your next appointment:   In Person  Provider:   Thomasene RippleKardie Illene Sweeting, DO   Other Instructions      Adopting a Healthy Lifestyle.  Know what a healthy weight is for you (roughly BMI <25) and aim to maintain this   Aim for 7+ servings of fruits and vegetables daily   65-80+ fluid ounces of water or unsweet tea for healthy kidneys   Limit to max 1 drink of alcohol per day; avoid smoking/tobacco   Limit animal fats in diet for cholesterol and heart health - choose grass fed whenever available   Avoid highly processed foods, and foods high in saturated/trans fats   Aim for low stress - take time to unwind and care for your mental health   Aim for 150 min of moderate intensity exercise weekly for heart health, and weights twice weekly for bone health   Aim for 7-9 hours of sleep daily   When it comes to diets, agreement about the perfect plan isnt easy to find, even among the experts. Experts at the [employer] developed an idea known as the Healthy Eating Plate. Just imagine a  plate divided into logical, healthy portions.   The emphasis is on diet quality:   Load up on vegetables and fruits - one-half of your plate: Aim for color and variety, and remember that potatoes dont  count.   Go for whole grains - one-quarter of your plate: Whole wheat, barley, wheat berries, quinoa, oats, brown rice, and foods made with them. If you want pasta, go with whole wheat pasta.   Protein power - one-quarter of your plate: Fish, chicken, beans, and nuts are all healthy, versatile protein sources. Limit red meat.   The diet, however, does go beyond the plate, offering a few other suggestions.   Use healthy plant oils, such as olive, canola, soy, corn, sunflower and peanut. Check the labels, and avoid partially hydrogenated oil, which have unhealthy trans fats.   If youre thirsty, drink water. Coffee and tea are good in moderation, but skip sugary drinks and limit milk and dairy products to one or two daily servings.   The type of carbohydrate in the diet is more important than the amount. Some sources of carbohydrates, such as vegetables, fruits, whole grains, and beans-are healthier than others.   Finally, stay active  Signed, Thomasene RippleKardie Xavian Hardcastle, DO  04/10/2020 11:52 AM    [facility]

## 2020-04-10 NOTE — Patient Instructions (Signed)
Medication Instructions:  Your physician recommends that you continue on your current medications as directed. Please refer to the Current Medication list given to you today.  *If you need a refill on your cardiac medications before your next appointment, please call your pharmacy*   Lab Work: Your physician recommends that you return for lab work in: TODAY CBC, TSH, BMP, Mag If you have labs (blood work) drawn today and your tests are completely normal, you will receive your results only by: Marland Kitchen MyChart Message (if you have MyChart) OR . A paper copy in the mail If you have any lab test that is abnormal or we need to change your treatment, we will call you to review the results.   Testing/Procedures: None   Follow-Up: At [facility], you and your health needs are our priority.  As part of our continuing mission to provide you with exceptional heart care, we have created designated Provider Care Teams.  These Care Teams include your primary Cardiologist (physician) and Advanced Practice Providers (APPs -  Physician Assistants and Nurse Practitioners) who all work together to provide you with the care you need, when you need it.  We recommend signing up for the patient portal called "MyChart".  Sign up information is provided on this After Visit Summary.  MyChart is used to connect with patients for Virtual Visits (Telemedicine).  Patients are able to view lab/test results, encounter notes, upcoming appointments, etc.  Non-urgent messages can be sent to your provider as well.   To learn more about what you can do with MyChart, go to [email or web address].    Your next appointment:   6 month(s)  The format for your next appointment:   In Person  Provider:   Thomasene Ripple, DO   Other Instructions

## 2020-04-11 LAB — CBC
Hematocrit: 40.2 % (ref 34.0–46.6)
Hemoglobin: 13.9 g/dL (ref 11.1–15.9)
MCH: 34.5 pg — ABNORMAL HIGH (ref 26.6–33.0)
MCHC: 34.6 g/dL (ref 31.5–35.7)
MCV: 100 fL — ABNORMAL HIGH (ref 79–97)
Platelets: 298 10*3/uL (ref 150–450)
RBC: 4.03 x10E6/uL (ref 3.77–5.28)
RDW: 11.5 % — ABNORMAL LOW (ref 11.7–15.4)
WBC: 7.3 10*3/uL (ref 3.4–10.8)

## 2020-04-11 LAB — BASIC METABOLIC PANEL
BUN/Creatinine Ratio: 10 — ABNORMAL LOW (ref 12–28)
BUN: 11 mg/dL (ref 8–27)
CO2: 20 mmol/L (ref 20–29)
Calcium: 9.2 mg/dL (ref 8.7–10.3)
Chloride: 100 mmol/L (ref 96–106)
Creatinine, Ser: 1.13 mg/dL — ABNORMAL HIGH (ref 0.57–1.00)
GFR calc Af Amer: 58 mL/min/{1.73_m2} — ABNORMAL LOW (ref >59)
GFR calc non Af Amer: 50 mL/min/{1.73_m2} — ABNORMAL LOW (ref >59)
Glucose: 70 mg/dL (ref 65–99)
Potassium: 4.4 mmol/L (ref 3.5–5.2)
Sodium: 136 mmol/L (ref 134–144)

## 2020-04-11 LAB — MAGNESIUM: Magnesium: 2.1 mg/dL (ref 1.6–2.3)

## 2020-04-11 LAB — VITAMIN D 25 HYDROXY (VIT D DEFICIENCY, FRACTURES): Vit D, 25-Hydroxy: 53.8 ng/mL (ref 30.0–100.0)

## 2020-04-11 LAB — TSH: TSH: 3.77 u[IU]/mL (ref 0.450–4.500)

## 2020-04-15 ENCOUNTER — Ambulatory Visit (INDEPENDENT_AMBULATORY_CARE_PROVIDER_SITE_OTHER): Payer: Medicare Other

## 2020-04-15 DIAGNOSIS — I639 Cerebral infarction, unspecified: Secondary | ICD-10-CM

## 2020-04-18 LAB — CUP PACEART REMOTE DEVICE CHECK
Date Time Interrogation Session: 20220112230703
Implantable Pulse Generator Implant Date: 20210802

## 2020-04-29 NOTE — Progress Notes (Signed)
Carelink Summary Report / Loop Recorder 

## 2020-05-15 LAB — CUP PACEART REMOTE DEVICE CHECK
Date Time Interrogation Session: 20220214230731
Implantable Pulse Generator Implant Date: 20210802

## 2020-05-20 ENCOUNTER — Ambulatory Visit (INDEPENDENT_AMBULATORY_CARE_PROVIDER_SITE_OTHER): Payer: Medicare Other

## 2020-05-20 DIAGNOSIS — I639 Cerebral infarction, unspecified: Secondary | ICD-10-CM | POA: Diagnosis not present

## 2020-05-28 NOTE — Progress Notes (Signed)
Carelink Summary Report / Loop Recorder 

## 2020-06-07 ENCOUNTER — Telehealth: Payer: Self-pay

## 2020-06-07 NOTE — Telephone Encounter (Signed)
The pt thought her app was not working. I assured her that the app was working properly. I called tech support and they assured her that the app is working and connecting to her.

## 2020-06-23 LAB — CUP PACEART REMOTE DEVICE CHECK
Date Time Interrogation Session: 20220319230144
Implantable Pulse Generator Implant Date: 20210802

## 2020-06-24 ENCOUNTER — Ambulatory Visit: Payer: Medicare Other

## 2020-07-10 ENCOUNTER — Ambulatory Visit: Payer: Medicare Other | Admitting: Cardiovascular Disease

## 2020-07-11 ENCOUNTER — Telehealth: Payer: Self-pay

## 2020-07-11 NOTE — Telephone Encounter (Signed)
The patient missed her PFO follow-up visit yesterday. Called for an update and to see if she would like to reschedule. Her daughter Delaney Meigs picked up the phone and then the call dropped. Attempted to call back. Left message that I am out of the office next week but will call the week after for an update. Informed her she may call the [facility] (number provided) next week if she would like to update prior to that time.

## 2020-07-17 NOTE — Telephone Encounter (Signed)
I spoke with the pt's daughter and rescheduled the pt's appointment with Dr Excell Seltzer to 08/02/20 at 11:00 AM.

## 2020-07-19 ENCOUNTER — Ambulatory Visit (INDEPENDENT_AMBULATORY_CARE_PROVIDER_SITE_OTHER): Payer: Medicare Other

## 2020-07-19 DIAGNOSIS — I639 Cerebral infarction, unspecified: Secondary | ICD-10-CM | POA: Diagnosis not present

## 2020-07-19 LAB — CUP PACEART REMOTE DEVICE CHECK
Date Time Interrogation Session: 20220421230422
Implantable Pulse Generator Implant Date: 20210802

## 2020-08-02 ENCOUNTER — Ambulatory Visit (INDEPENDENT_AMBULATORY_CARE_PROVIDER_SITE_OTHER): Payer: Medicare Other | Admitting: Cardiovascular Disease

## 2020-08-02 ENCOUNTER — Encounter: Payer: Self-pay | Admitting: Cardiovascular Disease

## 2020-08-02 ENCOUNTER — Other Ambulatory Visit: Payer: Self-pay

## 2020-08-02 VITALS — BP 120/70 | HR 74 | Ht 65.0 in | Wt 176.8 lb

## 2020-08-02 DIAGNOSIS — Q2112 Patent foramen ovale: Secondary | ICD-10-CM

## 2020-08-02 DIAGNOSIS — Q211 Atrial septal defect: Secondary | ICD-10-CM

## 2020-08-02 NOTE — Patient Instructions (Addendum)
Medication instructions: When you are called to confirm PFO closure date and time, we will review when to make your medication changes. You will stop Eliquis, wait a day, and start Plavix 75 mg and Aspirin 81 mg daily.   COVID SCREENING INFORMATION: You will be scheduled for a Covid test 2 days prior to your closure. Pre-Procedural COVID-19 Testing Site [phone] [place], [place] [address] You will need to go home after your screening and quarantine until your procedure.  PFO CLOSURE INSTRUCTIONS: You will be scheduled for your PFO closure AFTER September 04, 2020. I will call you to confirm date and time.  1. Once scheduled,you will arrive at the [facility] (Main Entrance A) at [facility]: [address] [place], [place] [address] two hours before your procedure to ensure your preparation. Free valet parking service is available. You are allowed ONE visitor in the waiting room during your procedure. Both you and your guest must wear masks. Special note: Every effort is made to have your procedure done on time. Please understand that emergencies sometimes delay scheduled procedures.  2. Diet: Do not eat solid foods after midnight.  You may have clear liquids until 5am upon the day of the procedure.  3. Labs: Will be drawn the morning before your Covid test.  4. Medication instructions in preparation for your procedure:  1) Make sure to take your Aspirin and Plavix the morning of your procedure  2) HOLD LASIX the morning of your closure   3) You may take your other meds as directed with a sip of water   5. Plan for one night stay--bring personal belongings. 6. Bring a current list of your medications and current insurance cards. 7. You MUST have a responsible person to drive you home. 8. Someone MUST be with you the first 24 hours after you arrive home or your discharge will be delayed. 9. Please wear clothes that are easy to get on and off and wear slip-on shoes.  FOLLOW-UP  VISIT: You will be scheduled for a 1 month follow-up visit once your closure is scheduled.

## 2020-08-02 NOTE — Progress Notes (Signed)
Cardiology Office Note:    Date:  08/03/2020   ID:  Chloe Gray, DOB 26-Feb-1951, MRN [id number]  PCP:  Chloe Court, NP   [facility] Providers Cardiologist:  Chloe Salines, DO     Referring MD: Chloe Court, NP   Chief Complaint  Patient presents with  . PFO    History of Present Illness:    Chloe Gray is a 70 y.o. female with a hx of PFO, returning for follow-up discussion today.  She was initially seen in June 2021 for evaluation of transcatheter PFO closure.  The patient has a history of multiple strokes and residual deficits with memory, speech, and gait instability.  She has undergone loop recorder implantation and also has had a transesophageal echo demonstrating a moderate to large PFO with positive bubble study.  She was last seen here in November 2021.  After discussion of potential treatment options, we decided to give her more time for cardiac monitoring to look for atrial fibrillation.  I felt that there was a higher likelihood that she may have paroxysmal atrial fibrillation as a cause of her strokes.  The patient is here with her daughter and her husband today.  This is the first time I have met her husband.  She reports no change in symptoms.  She denies chest pain, shortness of breath, or heart palpitations.  She has occasional leg swelling.  She has had no interval stroke/TIA symptoms since I have last seen her.  She remains on apixaban with no bleeding problems.  Her implantable monitor reports are reviewed and demonstrate no evidence of atrial fibrillation.  The patient tires easily, but otherwise has no specific cardiac related symptoms.  She and her husband are taking a trip to [facility] in [place] in the near future.  Past Medical History:  Diagnosis Date  . Anxiety   . Arthritis   . Asthma    PFT 12/12  PCP on chart  . Cerebral aneurysm without rupture 10/14/2017  . Cerebrovascular accident (CVA) ([place]) 07/11/2019  . Depression    states well controlled   . Essential hypertension 07/11/2019  . GERD (gastroesophageal reflux disease)   . H/O hiatal hernia   . Hypercholesterolemia   . Hypertension    clearance note with OV Dr Nicki Reaper, EKG on chart 03/09/11  . MCI (mild cognitive impairment) 10/14/2017  . Mixed hyperlipidemia 07/11/2019  . OSA (obstructive sleep apnea) 07/11/2019  . Osteoarthritis of right knee 05/01/2011  . Pneumonia   . Shortness of breath    with asthma  . Sleep apnea   . Stroke ([place])   . 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 Vitamin B 12 deficiency     Past Surgical History:  Procedure Laterality Date  . COLONOSCOPY    . KNEE ARTHROSCOPY    . TOTAL KNEE ARTHROPLASTY  04/30/2011   Procedure: TOTAL KNEE ARTHROPLASTY;  Surgeon: Johnn Hai, MD;  Location: [facility];  Service: Orthopedics;  Laterality: Right;  Femoral nerve block  . TUBAL LIGATION    . UMBILICAL HERNIA REPAIR      Current Medications: Current Meds  Medication Sig  . albuterol (PROVENTIL HFA;VENTOLIN HFA) 108 (90 BASE) MCG/ACT inhaler Inhale 2 puffs into the lungs every 6 (six) hours as needed. For shortness of breath.  Marland Kitchen albuterol (PROVENTIL) (2.5 MG/3ML) 0.083% nebulizer solution Take 2.5 mg by nebulization every 6 (six) hours as needed. For shortness of breath.  . ALPRAZolam (XANAX) 1 MG tablet Take 1 mg  by mouth 3 (three) times daily with meals.   Marland Kitchen amLODipine (NORVASC) 10 MG tablet Take 10 mg by mouth daily.  Marland Kitchen apixaban (ELIQUIS) 5 MG TABS tablet Take 1 tablet (5 mg total) by mouth 2 (two) times daily.  . Armodafinil 200 MG TABS Take 1 tablet by mouth every morning.  . benzonatate (TESSALON) 100 MG capsule Take 100 mg by mouth 3 (three) times daily as needed.  Marland Kitchen buPROPion (WELLBUTRIN XL) 300 MG 24 hr tablet Take 1 tablet by mouth every morning.  . citalopram (CELEXA) 10 MG tablet Take 30 mg by mouth at bedtime.  . cyanocobalamin (,VITAMIN B-12,) 1000 MCG/ML injection Inject 100 mcg into the muscle every 30 (thirty) days.  . famotidine (PEPCID)  20 MG tablet Take 20 mg by mouth at bedtime.  . furosemide (LASIX) 20 MG tablet TAKE ONE TABLET DAILY AS NEEDED FOR SWELLING OF LEGS  . lisinopril (ZESTRIL) 40 MG tablet Take 40 mg by mouth daily.  . montelukast (SINGULAIR) 10 MG tablet Take 10 mg by mouth daily.  . rosuvastatin (CRESTOR) 10 MG tablet Take by mouth.  . sodium bicarbonate 650 MG tablet Take 1,300 mg by mouth 2 (two) times daily.  . traZODone (DESYREL) 100 MG tablet Take 100 mg by mouth at bedtime as needed.      Allergies:   Latex   Social History   Socioeconomic History  . Marital status: Married    Spouse name: Not on file  . Number of children: Not on file  . Years of education: Not on file  . Highest education level: Not on file  Occupational History  . Not on file  Tobacco Use  . Smoking status: Never Smoker  . Smokeless tobacco: Never Used  Vaping Use  . Vaping Use: Never used  Substance and Sexual Activity  . Alcohol use: No  . Drug use: No  . Sexual activity: Not on file  Other Topics Concern  . Not on file  Social History Narrative  . Not on file   Social Determinants of Health   Financial Resource Strain: Not on file  Food Insecurity: Not on file  Transportation Needs: Not on file  Physical Activity: Not on file  Stress: Not on file  Social Connections: Not on file     Family History: The patient's family history includes Hypertension in her brother, father, mother, sister, sister, and sister; Stroke in her mother.  ROS:   Please see the history of present illness.    All other systems reviewed and are negative.  EKGs/Labs/Other Studies Reviewed:    The following studies were reviewed today: TEE from 12/08/2019 is personally reviewed today.  This demonstrates normal LV and RV function, no significant valvular disease, and a moderate PFO with positive bubble study.  The atrial septum is aneurysmal.  EKG:  EKG is ordered today.  The ekg ordered today demonstrates normal sinus rhythm 74  bpm, left axis deviation.  Recent Labs: 04/10/2020: BUN 11; Creatinine, Ser 1.13; Hemoglobin 13.9; Magnesium 2.1; Platelets 298; Potassium 4.4; Sodium 136; TSH 3.770  Recent Lipid Panel    Component Value Date/Time   CHOL 146 10/14/2017 1033   TRIG 134 10/14/2017 1033   HDL 53 10/14/2017 1033   CHOLHDL 2.8 10/14/2017 1033   LDLCALC 66 10/14/2017 1033     Risk Assessment/Calculations:       Physical Exam:    VS:  BP 120/70   Pulse 74   Ht _0  (1.651 m)  Wt 176 lb 12.8 oz (80.2 kg)   SpO2 94%   BMI 29.42 kg/m     Wt Readings from Last 3 Encounters:  08/02/20 176 lb 12.8 oz (80.2 kg)  04/10/20 176 lb 3.2 oz (79.9 kg)  02/12/20 177 lb 6.4 oz (80.5 kg)     GEN:  Well nourished, well developed in no acute distress HEENT: Normal NECK: No JVD; No carotid bruits LYMPHATICS: No lymphadenopathy CARDIAC: RRR, no murmurs, rubs, gallops RESPIRATORY:  Clear to auscultation without rales, wheezing or rhonchi  ABDOMEN: Soft, non-tender, non-distended MUSCULOSKELETAL:  No edema; No deformity  SKIN: Warm and dry NEUROLOGIC:  Alert and oriented x 3 PSYCHIATRIC:  Normal affect   ASSESSMENT:    1. PFO with atrial septal aneurysm    PLAN:    In order of problems listed above:  1. I again reviewed the patient's clinical history with recurrent stroke and MRI evidence suggesting cardioembolic source.  She has now been monitored for 9 months with no evidence of atrial fibrillation.  TEE demonstrates a moderate sized PFO with redundant atrial septum.  We reviewed potential etiology of cryptogenic stroke today.  We have had past discussion regarding this as well.  The patient really does not have a long-term indication for chronic oral anticoagulation.  After discussion of treatment options, the patient would like to proceed with transcatheter PFO closure in order to reduce her risk of recurrent stroke.  We will ultimately transition her from apixaban to aspirin and clopidogrel.  She will  undergo transcatheter PFO closure once she has changed her medical program and has returned from her travel.  I have reviewed the procedure in detail with the patient, her daughter, and her husband who are present today.  I demonstrated the PFO occluder device and discussed procedural steps.  Potential risks including vascular injury, infection, arrhythmia, cardiac perforation, device embolization, late device erosion, stroke, myocardial infarction, cardiac injury with tamponade, and emergency surgery, and death, were reviewed with the patient.  They understand these risks occur at a very low incidence of less than 1%.   Shared Decision Making/Informed Consent The risks [stroke (1 in 1000), death (1 in 1000), kidney failure [usually temporary] (1 in 500), bleeding (1 in 200), allergic reaction [possibly serious] (1 in 200)], benefits (diagnostic support and management of coronary artery disease) and alternatives of a cardiac catheterization were discussed in detail with Ms. Wrede and she is willing to proceed.        Medication Adjustments/Labs and Tests Ordered: Current medicines are reviewed at length with the patient today.  Concerns regarding medicines are outlined above.  Orders Placed This Encounter  Procedures  . EKG 12-Lead   No orders of the defined types were placed in this encounter.   Patient Instructions  Medication instructions: When you are called to confirm PFO closure date and time, we will review when to make your medication changes. You will stop Eliquis, wait a day, and start Plavix 75 mg and Aspirin 81 mg daily.   COVID SCREENING INFORMATION: You will be scheduled for a Covid test 2 days prior to your closure. Pre-Procedural COVID-19 Testing Site [phone] [place], [place] [address] You will need to go home after your screening and quarantine until your procedure.  PFO CLOSURE INSTRUCTIONS: You will be scheduled for your PFO closure AFTER September 04, 2020. I will  call you to confirm date and time.  1. Once scheduled,you will arrive at the [facility] (Main Entrance A) at [facility]  [facility]: [address] [place], [place] [address] two hours before your procedure to ensure your preparation. Free valet parking service is available. You are allowed ONE visitor in the waiting room during your procedure. Both you and your guest must wear masks. Special note: Every effort is made to have your procedure done on time. Please understand that emergencies sometimes delay scheduled procedures.  2. Diet: Do not eat solid foods after midnight.  You may have clear liquids until 5am upon the day of the procedure.  3. Labs: Will be drawn the morning before your Covid test.  4. Medication instructions in preparation for your procedure:  1) Make sure to take your Aspirin and Plavix the morning of your procedure  2) HOLD LASIX the morning of your closure   3) You may take your other meds as directed with a sip of water   5. Plan for one night stay--bring personal belongings. 6. Bring a current list of your medications and current insurance cards. 7. You MUST have a responsible person to drive you home. 8. Someone MUST be with you the first 24 hours after you arrive home or your discharge will be delayed. 9. Please wear clothes that are easy to get on and off and wear slip-on shoes.  FOLLOW-UP VISIT: You will be scheduled for a 1 month follow-up visit once your closure is scheduled.    Signed, Sherren Mocha, MD  08/03/2020 10:37 PM    [facility]

## 2020-08-03 ENCOUNTER — Encounter: Payer: Self-pay | Admitting: Cardiovascular Disease

## 2020-08-07 NOTE — Progress Notes (Signed)
Carelink Summary Report / Loop Recorder 

## 2020-08-08 ENCOUNTER — Telehealth: Payer: Self-pay

## 2020-08-08 NOTE — Telephone Encounter (Signed)
Spoke with the patient's daughter Delaney Meigs ([place]).  She agrees to PFO closure date 09/16/20. She understands they will be called after 09/04/20 to review instructions again, to schedule Covid test if needed, and to arrange lab work in [place].  Will instructed the patient to STOP ELIQUIS, skip a day, and START PLAVIX 75 mg daily and ASA 81 mg daily.  [employer] rep aware.

## 2020-08-21 ENCOUNTER — Ambulatory Visit (INDEPENDENT_AMBULATORY_CARE_PROVIDER_SITE_OTHER): Payer: Medicare Other

## 2020-08-21 DIAGNOSIS — I639 Cerebral infarction, unspecified: Secondary | ICD-10-CM

## 2020-08-21 LAB — CUP PACEART REMOTE DEVICE CHECK
Date Time Interrogation Session: 20220524230502
Implantable Pulse Generator Implant Date: 20210802

## 2020-09-05 MED ORDER — ASPIRIN EC 81 MG PO TBEC: 81.0000 mg | DELAYED_RELEASE_TABLET | Freq: Every day | ORAL | 3 refills | Status: AC

## 2020-09-05 MED ORDER — CLOPIDOGREL BISULFATE 75 MG PO TABS: 75.0000 mg | ORAL_TABLET | Freq: Every day | ORAL | 3 refills | Status: DC

## 2020-09-05 NOTE — Telephone Encounter (Addendum)
Spoke with both the patient and her daughter, Chloe Gray.  Medication instructions and PFO instructions reviewed. She had a visit with her PCP today and had labs drawn. Will request those labs sent to the office ASAP.  MyChart message sent with updated instructions. They were grateful for call and agree with plan.    Called [facility]. Labs drawn yesterday did not include CBC. They will attempt to add on labs. Will call early next week to request labs. The patient understood they would have to go to [facility] to get updated blood work if unable to run labs. [facility] phone: [phone].

## 2020-09-10 NOTE — Telephone Encounter (Signed)
Labs requested, received, and given to Medical Records to upload to Epic.

## 2020-09-12 ENCOUNTER — Telehealth: Payer: Self-pay | Admitting: *Deleted

## 2020-09-12 NOTE — Progress Notes (Signed)
Carelink Summary Report / Loop Recorder 

## 2020-09-12 NOTE — Telephone Encounter (Addendum)
Pt contacted pre-PFO closure scheduled at [facility] for: Monday September 16, 2020 7:30 AM Verified arrival time and place: [facility] Main Entrance A [facility]) at: 5:30 AM   No solid food after midnight prior to cath, clear liquids until 5 AM day of procedure.  Hold: Lasix -AM of procedure Eliquis-patient discontinued 09/08/20  Except hold medications AM meds can be  taken pre-cath with sips of water including: ASA 81 mg Plavix 75 mg  Confirmed patient has responsible adult to drive home post procedure and be with patient first 24 hours after arriving home: yes  You are allowed ONE visitor in the waiting room during the time you are at the hospital for your procedure. Both you and your visitor must wear a mask once you enter the hospital.   Patient reports does not currently have any symptoms concerning for COVID-19 and no household members with COVID-19 like illness.        Reviewed procedure/mask/visitor instructions with patient's daughter (DPR), Joni.

## 2020-09-13 ENCOUNTER — Other Ambulatory Visit (HOSPITAL_COMMUNITY): Payer: Medicare Other

## 2020-09-16 ENCOUNTER — Other Ambulatory Visit (HOSPITAL_COMMUNITY): Payer: Medicare Other

## 2020-09-16 ENCOUNTER — Other Ambulatory Visit: Payer: Self-pay

## 2020-09-16 ENCOUNTER — Encounter (HOSPITAL_COMMUNITY): Payer: Self-pay | Admitting: Cardiovascular Disease

## 2020-09-16 ENCOUNTER — Encounter (HOSPITAL_COMMUNITY)
Admission: RE | Disposition: A | Discharge status: Home / Self Care | Payer: Self-pay | Source: Home / Self Care | Attending: Cardiovascular Disease

## 2020-09-16 ENCOUNTER — Ambulatory Visit (HOSPITAL_COMMUNITY)
Admission: RE | Admit: 2020-09-16 | Discharge: 2020-09-16 | Disposition: A | Discharge status: Home / Self Care | Payer: Medicare Other | Attending: Cardiovascular Disease | Admitting: Cardiovascular Disease

## 2020-09-16 DIAGNOSIS — Q2112 Patent foramen ovale: Secondary | ICD-10-CM

## 2020-09-16 DIAGNOSIS — Z7901 Long term (current) use of anticoagulants: Secondary | ICD-10-CM | POA: Insufficient documentation

## 2020-09-16 DIAGNOSIS — Z8669 Personal history of other diseases of the nervous system and sense organs: Secondary | ICD-10-CM | POA: Diagnosis not present

## 2020-09-16 DIAGNOSIS — Q211 Atrial septal defect: Secondary | ICD-10-CM | POA: Insufficient documentation

## 2020-09-16 DIAGNOSIS — Z96651 Presence of right artificial knee joint: Secondary | ICD-10-CM | POA: Insufficient documentation

## 2020-09-16 DIAGNOSIS — Z8673 Personal history of transient ischemic attack (TIA), and cerebral infarction without residual deficits: Secondary | ICD-10-CM | POA: Diagnosis not present

## 2020-09-16 DIAGNOSIS — Z91040 Latex allergy status: Secondary | ICD-10-CM | POA: Diagnosis not present

## 2020-09-16 DIAGNOSIS — Z79899 Other long term (current) drug therapy: Secondary | ICD-10-CM | POA: Insufficient documentation

## 2020-09-16 DIAGNOSIS — I10 Essential (primary) hypertension: Secondary | ICD-10-CM | POA: Diagnosis not present

## 2020-09-16 DIAGNOSIS — E782 Mixed hyperlipidemia: Secondary | ICD-10-CM | POA: Diagnosis not present

## 2020-09-16 DIAGNOSIS — Z823 Family history of stroke: Secondary | ICD-10-CM | POA: Diagnosis not present

## 2020-09-16 DIAGNOSIS — Z8249 Family history of ischemic heart disease and other diseases of the circulatory system: Secondary | ICD-10-CM | POA: Insufficient documentation

## 2020-09-16 DIAGNOSIS — I253 Aneurysm of heart: Secondary | ICD-10-CM

## 2020-09-16 HISTORY — DX: Patent foramen ovale: Q21.12

## 2020-09-16 HISTORY — DX: Atrial septal defect: Q21.1

## 2020-09-16 HISTORY — PX: PATENT FORAMEN OVALE(PFO) CLOSURE: CATH118300

## 2020-09-16 HISTORY — DX: Aneurysm of heart: I25.3

## 2020-09-16 LAB — CBC
HCT: 38.6 % (ref 36.0–46.0)
Hemoglobin: 13.4 g/dL (ref 12.0–15.0)
MCH: 34.2 pg — ABNORMAL HIGH (ref 26.0–34.0)
MCHC: 34.7 g/dL (ref 30.0–36.0)
MCV: 98.5 fL (ref 80.0–100.0)
Platelets: 278 10*3/uL (ref 150–400)
RBC: 3.92 MIL/uL (ref 3.87–5.11)
RDW: 12 % (ref 11.5–15.5)
WBC: 8.2 10*3/uL (ref 4.0–10.5)
nRBC: 0 % (ref 0.0–0.2)

## 2020-09-16 LAB — POCT ACTIVATED CLOTTING TIME
Activated Clotting Time: 196 seconds
Activated Clotting Time: 242 seconds

## 2020-09-16 LAB — BASIC METABOLIC PANEL
Anion gap: 13 (ref 5–15)
BUN: 15 mg/dL (ref 8–23)
CO2: 23 mmol/L (ref 22–32)
Calcium: 9.4 mg/dL (ref 8.9–10.3)
Chloride: 97 mmol/L — ABNORMAL LOW (ref 98–111)
Creatinine, Ser: 1.31 mg/dL — ABNORMAL HIGH (ref 0.44–1.00)
GFR, Estimated: 44 mL/min — ABNORMAL LOW (ref >60)
Glucose, Bld: 96 mg/dL (ref 70–99)
Potassium: 4 mmol/L (ref 3.5–5.1)
Sodium: 133 mmol/L — ABNORMAL LOW (ref 135–145)

## 2020-09-16 SURGERY — PATENT FORAMEN OVALE (PFO) CLOSURE
Anesthesia: LOCAL

## 2020-09-16 MED ORDER — HEPARIN SODIUM (PORCINE) 1000 UNIT/ML IJ SOLN
INTRAMUSCULAR | Status: AC
  Filled 2020-09-16: qty 1

## 2020-09-16 MED ORDER — SODIUM CHLORIDE 0.9 % WEIGHT BASED INFUSION
1.0000 mL/kg/h | INTRAVENOUS | Status: DC
  Administered 2020-09-16: 1 mL/kg/h via INTRAVENOUS

## 2020-09-16 MED ORDER — CLOPIDOGREL BISULFATE 75 MG PO TABS: 75.0000 mg | ORAL_TABLET | ORAL | Status: DC

## 2020-09-16 MED ORDER — LIDOCAINE HCL (PF) 1 % IJ SOLN
INTRAMUSCULAR | Status: AC
  Filled 2020-09-16: qty 30

## 2020-09-16 MED ORDER — HEPARIN (PORCINE) IN NACL 1000-0.9 UT/500ML-% IV SOLN
INTRAVENOUS | Status: AC
  Filled 2020-09-16: qty 1000

## 2020-09-16 MED ORDER — LIDOCAINE HCL (PF) 1 % IJ SOLN
INTRAMUSCULAR | Status: DC | PRN
  Administered 2020-09-16: 15 mL via SUBCUTANEOUS

## 2020-09-16 MED ORDER — CEFAZOLIN SODIUM-DEXTROSE 2-4 GM/100ML-% IV SOLN: 2.0000 g | INTRAVENOUS | Status: DC

## 2020-09-16 MED ORDER — SODIUM CHLORIDE 0.9% FLUSH: 3.0000 mL | Freq: Two times a day (BID) | INTRAVENOUS | Status: DC

## 2020-09-16 MED ORDER — SODIUM CHLORIDE 0.9% FLUSH: 3.0000 mL | INTRAVENOUS | Status: DC | PRN

## 2020-09-16 MED ORDER — ONDANSETRON HCL 4 MG/2ML IJ SOLN
4.0000 mg | Freq: Four times a day (QID) | INTRAMUSCULAR | Status: DC | PRN
Start: 2020-09-16 — End: 2020-09-16

## 2020-09-16 MED ORDER — MIDAZOLAM HCL 2 MG/2ML IJ SOLN
INTRAMUSCULAR | Status: AC
  Filled 2020-09-16: qty 2

## 2020-09-16 MED ORDER — LABETALOL HCL 5 MG/ML IV SOLN: 10.0000 mg | INTRAVENOUS | Status: DC | PRN

## 2020-09-16 MED ORDER — HEPARIN SODIUM (PORCINE) 1000 UNIT/ML IJ SOLN
INTRAMUSCULAR | Status: DC | PRN
  Administered 2020-09-16: 6000 [IU] via INTRAVENOUS
  Administered 2020-09-16: 4000 [IU] via INTRAVENOUS

## 2020-09-16 MED ORDER — FENTANYL CITRATE (PF) 100 MCG/2ML IJ SOLN
INTRAMUSCULAR | Status: AC
  Filled 2020-09-16: qty 2

## 2020-09-16 MED ORDER — HYDRALAZINE HCL 20 MG/ML IJ SOLN
10.0000 mg | INTRAMUSCULAR | Status: DC | PRN
Start: 2020-09-16 — End: 2020-09-16

## 2020-09-16 MED ORDER — FENTANYL CITRATE (PF) 100 MCG/2ML IJ SOLN
INTRAMUSCULAR | Status: DC | PRN
  Administered 2020-09-16 (×2): 25 ug via INTRAVENOUS

## 2020-09-16 MED ORDER — ACETAMINOPHEN 325 MG PO TABS: 650.0000 mg | ORAL_TABLET | ORAL | Status: DC | PRN

## 2020-09-16 MED ORDER — BUPROPION HCL ER (XL) 150 MG PO TB24: 150.0000 mg | ORAL_TABLET | Freq: Every day | ORAL | 0 refills | Status: AC

## 2020-09-16 MED ORDER — SODIUM CHLORIDE 0.9 % WEIGHT BASED INFUSION
3.0000 mL/kg/h | INTRAVENOUS | Status: AC
  Administered 2020-09-16: 3 mL/kg/h via INTRAVENOUS

## 2020-09-16 MED ORDER — MIDAZOLAM HCL 2 MG/2ML IJ SOLN
INTRAMUSCULAR | Status: DC | PRN
  Administered 2020-09-16 (×2): 1 mg via INTRAVENOUS

## 2020-09-16 MED ORDER — SODIUM CHLORIDE 0.9 % IV SOLN: 250.0000 mL | INTRAVENOUS | Status: DC | PRN

## 2020-09-16 MED ORDER — ASPIRIN 81 MG PO CHEW: 81.0000 mg | CHEWABLE_TABLET | ORAL | Status: DC

## 2020-09-16 MED ORDER — HEPARIN (PORCINE) IN NACL 1000-0.9 UT/500ML-% IV SOLN
INTRAVENOUS | Status: DC | PRN
  Administered 2020-09-16 (×2): 500 mL

## 2020-09-16 SURGICAL SUPPLY — 16 items
BALLN SIZING AMPLATZER 24 (BALLOONS) ×2
BALLOON SIZING AMPLATZER 24 (BALLOONS) ×1 IMPLANT
CATH ACUNAV REPROCESSED (CATHETERS) ×2 IMPLANT
CATH EXPO 5F MPA-1 (CATHETERS) ×2 IMPLANT
CLOSURE PERCLOSE PROSTYLE (VASCULAR PRODUCTS) ×4 IMPLANT
COVER SWIFTLINK CONNECTOR (BAG) ×2 IMPLANT
GUIDEWIRE AMPLATZER 1.5JX260 (WIRE) ×2 IMPLANT
OCCLUDER AMPLATZER SEPTAL 12MM (Prosthesis & Implant Heart) ×2 IMPLANT
PACK CARDIAC CATHETERIZATION (CUSTOM PROCEDURE TRAY) ×2 IMPLANT
SHEATH INTROD W/O MIN 9FR 25CM (SHEATH) ×2 IMPLANT
SHEATH PINNACLE 8F 10CM (SHEATH) ×2 IMPLANT
SYS DELIVER AMP TREVISIO 8FR (SHEATH) ×2
SYSTEM DELIVER AMP TREVIS 8FR (SHEATH) ×1 IMPLANT
TRANSDUCER W/STOPCOCK (MISCELLANEOUS) IMPLANT
TUBING CIL FLEX 10 FLL-RA (TUBING) IMPLANT
WIRE EMERALD 3MM-J .035X150CM (WIRE) ×2 IMPLANT

## 2020-09-16 NOTE — H&P (Signed)
Chief Complaint  Patient presents with   PFO      History of Present Illness:     Chloe Gray is a 70 y.o. female with a hx of PFO, returning for follow-up discussion today.  She was initially seen in June 2021 for evaluation of transcatheter PFO closure.  The patient has a history of multiple strokes and residual deficits with memory, speech, and gait instability.  She has undergone loop recorder implantation and also has had a transesophageal echo demonstrating a moderate to large PFO with positive bubble study.  She was last seen here in November 2021.  After discussion of potential treatment options, we decided to give her more time for cardiac monitoring to look for atrial fibrillation.  I felt that there was a higher likelihood that she may have paroxysmal atrial fibrillation as a cause of her strokes.   The patient is here with her daughter and her husband today.  This is the first time I have met her husband.  She reports no change in symptoms.  She denies chest pain, shortness of breath, or heart palpitations.  She has occasional leg swelling.  She has had no interval stroke/TIA symptoms since I have last seen her.  She remains on apixaban with no bleeding problems.  Her implantable monitor reports are reviewed and demonstrate no evidence of atrial fibrillation.  The patient tires easily, but otherwise has no specific cardiac related symptoms.  She and her husband are taking a trip to [facility] in [place] in the near future.       Past Medical History:  Diagnosis Date   Anxiety     Arthritis     Asthma      PFT 12/12  PCP on chart   Cerebral aneurysm without rupture 10/14/2017   Cerebrovascular accident (CVA) ([place]) 07/11/2019   Depression      states well controlled   Essential hypertension 07/11/2019   GERD (gastroesophageal reflux disease)     H/O hiatal hernia     Hypercholesterolemia     Hypertension      clearance note with OV Dr Nicki Reaper, EKG on chart 03/09/11   MCI (mild  cognitive impairment) 10/14/2017   Mixed hyperlipidemia 07/11/2019   OSA (obstructive sleep apnea) 07/11/2019   Osteoarthritis of right knee 05/01/2011   Pneumonia     Shortness of breath      with asthma   Sleep apnea     Stroke [facility])     Thalamic infarct, 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 For shortness of breath.   albuterol (PROVENTIL) (2.5 MG/3ML) 0.083% nebulizer solution Take 2.5 mg by nebulization every 6 (six) hours as needed. For shortness of breath.   ALPRAZolam (XANAX) 1 MG tablet Take 1  mg by mouth 3 (three) times daily with meals.   amLODipine (NORVASC) 10 MG tablet Take 10 mg by mouth daily.   apixaban (ELIQUIS) 5 MG TABS tablet Take 1 tablet (5 mg total) by mouth 2 (two) times daily.   Armodafinil 200 MG TABS Take 1 tablet by mouth every morning.   benzonatate (TESSALON) 100 MG capsule Take 100 mg by mouth 3 (three) times daily as needed.   buPROPion (WELLBUTRIN XL) 300 MG 24 hr tablet Take 1 tablet by mouth every morning.   citalopram (CELEXA) 10 MG tablet Take 30 mg by mouth at bedtime.   cyanocobalamin (,VITAMIN B-12,) 1000 MCG/ML injection Inject 100 mcg into the muscle every 30 (thirty) days.   famotidine (PEPCID) 20 MG tablet Take 20 mg by mouth at bedtime.   furosemide (LASIX) 20 MG tablet TAKE ONE TABLET DAILY AS NEEDED FOR SWELLING OF LEGS   lisinopril (ZESTRIL) 40 MG tablet Take 40 mg by mouth daily.   montelukast  (SINGULAIR) 10 MG tablet Take 10 mg by mouth daily.   rosuvastatin (CRESTOR) 10 MG tablet Take by mouth.   sodium bicarbonate 650 MG tablet Take 1,300 mg by mouth 2 (two) times daily.   traZODone (DESYREL) 100 MG tablet Take 100 mg by mouth at bedtime as needed.       Allergies:   Latex    Social History         Socioeconomic History   Marital status: Married      Spouse name: Not on file   Number of children: Not on file   Years of education: Not on file   Highest education level: Not on file  Occupational History   Not on file  Tobacco Use   Smoking status: Never Smoker   Smokeless tobacco: Never Used  Vaping Use   Vaping Use: Never used  Substance and Sexual Activity   Alcohol use: No   Drug use: No   Sexual activity: Not on file  Other Topics Concern   Not on file  Social History Narrative   Not on file    Social Determinants of Health    Financial Resource Strain: Not on file  Food Insecurity: Not on file  Transportation Needs: Not on file  Physical Activity: Not on file  Stress: Not on file  Social Connections: Not on file      Family History: The patient's family history includes Hypertension in her brother, father, mother, sister, sister, and sister; Stroke in her mother.   ROS:   Please see the history of present illness.    All other systems reviewed and are negative.   EKGs/Labs/Other Studies Reviewed:     The following studies were reviewed today: TEE from 12/08/2019 is personally reviewed today.  This demonstrates normal LV and RV function, no significant valvular disease, and a moderate PFO with positive bubble study.  The atrial septum is aneurysmal.   EKG:  EKG is ordered today.  The ekg ordered today demonstrates normal sinus rhythm 74 bpm, left axis deviation.   Recent Labs: 04/10/2020: BUN 11; Creatinine, Ser 1.13; Hemoglobin 13.9; Magnesium 2.1; Platelets 298; Potassium 4.4; Sodium 136; TSH 3.770  Recent Lipid Panel         Component  Value Date/Time    CHOL 146 10/14/2017 1033    TRIG 134 10/14/2017 1033    HDL 53 10/14/2017 1033    CHOLHDL 2.8 10/14/2017 1033    LDLCALC 66 10/14/2017 1033  Risk Assessment/Calculations:         Physical Exam:    VS:  BP 120/70   Pulse 74   Ht _0  (1.651 m)   Wt 176 lb 12.8 oz (80.2 kg)   SpO2 94%   BMI 29.42 kg/m         Wt Readings from Last 3 Encounters:  08/02/20 176 lb 12.8 oz (80.2 kg)  04/10/20 176 lb 3.2 oz (79.9 kg)  02/12/20 177 lb 6.4 oz (80.5 kg)      GEN:  Well nourished, well developed in no acute distress HEENT: Normal NECK: No JVD; No carotid bruits LYMPHATICS: No lymphadenopathy CARDIAC: RRR, no murmurs, rubs, gallops RESPIRATORY:  Clear to auscultation without rales, wheezing or rhonchi ABDOMEN: Soft, non-tender, non-distended MUSCULOSKELETAL:  No edema; No deformity SKIN: Warm and dry NEUROLOGIC:  Alert and oriented x 3 PSYCHIATRIC:  Normal affect   ASSESSMENT:     1. PFO with atrial septal aneurysm     PLAN:    In order of problems listed above:   I again reviewed the patient's clinical history with recurrent stroke and MRI evidence suggesting cardioembolic source.  She has now been monitored for 9 months with no evidence of atrial fibrillation.  TEE demonstrates a moderate sized PFO with redundant atrial septum.  We reviewed potential etiology of cryptogenic stroke today.  We have had past discussion regarding this as well.  The patient really does not have a long-term indication for chronic oral anticoagulation.  After discussion of treatment options, the patient would like to proceed with transcatheter PFO closure in order to reduce her risk of recurrent stroke.  We will ultimately transition her from apixaban to aspirin and clopidogrel.  She will undergo transcatheter PFO closure once she has changed her medical program and has returned from her travel.  I have reviewed the procedure in detail with the patient, her daughter, and her  husband who are present today.  I demonstrated the PFO occluder device and discussed procedural steps.  Potential risks including vascular injury, infection, arrhythmia, cardiac perforation, device embolization, late device erosion, stroke, myocardial infarction, cardiac injury with tamponade, and emergency surgery, and death, were reviewed with the patient.  They understand these risks occur at a very low incidence of less than 1%.  ADDENDUM: The patient is evaluated in the short stay area this morning before coming into the Cath Lab.  She reports that her husband is here in the hospital with her.  She reports no changes since her recent office visit in May of this year, copied above.  She specifically denies chest pain, shortness of breath, or heart palpitations.  Exam findings are unchanged.  All of her questions were answered regarding transcatheter PFO closure.  She provides full informed consent and is agreeable to proceed as planned.  Sherren Mocha 09/16/2020 7:33 AM

## 2020-09-16 NOTE — Progress Notes (Signed)
Discharge instructions reviewed with patient and spouse at bedside.  Both verbalized understanding.  Pt was ambulated a small amount of blood noted to groin site. Pressure was held and bleeding controlled. Dressing placed. Pt voided.  Pt was escorted in a wheelchair with family and staff.

## 2020-09-16 NOTE — Discharge Instructions (Signed)

## 2020-09-17 ENCOUNTER — Encounter (HOSPITAL_COMMUNITY): Payer: Self-pay | Admitting: Cardiovascular Disease

## 2020-09-23 ENCOUNTER — Ambulatory Visit (INDEPENDENT_AMBULATORY_CARE_PROVIDER_SITE_OTHER): Payer: Medicare Other

## 2020-09-23 DIAGNOSIS — I639 Cerebral infarction, unspecified: Secondary | ICD-10-CM | POA: Diagnosis not present

## 2020-09-24 LAB — CUP PACEART REMOTE DEVICE CHECK
Date Time Interrogation Session: 20220626230557
Implantable Pulse Generator Implant Date: 20210802

## 2020-10-07 DIAGNOSIS — K219 Gastro-esophageal reflux disease without esophagitis: Secondary | ICD-10-CM | POA: Insufficient documentation

## 2020-10-07 DIAGNOSIS — M1990 Unspecified osteoarthritis, unspecified site: Secondary | ICD-10-CM | POA: Insufficient documentation

## 2020-10-07 DIAGNOSIS — R0602 Shortness of breath: Secondary | ICD-10-CM | POA: Insufficient documentation

## 2020-10-07 DIAGNOSIS — I639 Cerebral infarction, unspecified: Secondary | ICD-10-CM | POA: Insufficient documentation

## 2020-10-07 DIAGNOSIS — G4730 Sleep apnea, unspecified: Secondary | ICD-10-CM | POA: Insufficient documentation

## 2020-10-07 DIAGNOSIS — E538 Deficiency of other specified B group vitamins: Secondary | ICD-10-CM | POA: Insufficient documentation

## 2020-10-07 DIAGNOSIS — J45909 Unspecified asthma, uncomplicated: Secondary | ICD-10-CM | POA: Insufficient documentation

## 2020-10-07 DIAGNOSIS — F419 Anxiety disorder, unspecified: Secondary | ICD-10-CM | POA: Insufficient documentation

## 2020-10-07 DIAGNOSIS — J189 Pneumonia, unspecified organism: Secondary | ICD-10-CM | POA: Insufficient documentation

## 2020-10-07 DIAGNOSIS — F32A Depression, unspecified: Secondary | ICD-10-CM | POA: Insufficient documentation

## 2020-10-07 DIAGNOSIS — I10 Essential (primary) hypertension: Secondary | ICD-10-CM | POA: Insufficient documentation

## 2020-10-07 DIAGNOSIS — E7800 Pure hypercholesterolemia, unspecified: Secondary | ICD-10-CM | POA: Insufficient documentation

## 2020-10-07 DIAGNOSIS — Z8719 Personal history of other diseases of the digestive system: Secondary | ICD-10-CM | POA: Insufficient documentation

## 2020-10-08 ENCOUNTER — Other Ambulatory Visit: Payer: Self-pay

## 2020-10-08 ENCOUNTER — Ambulatory Visit: Payer: Medicare Other | Admitting: Cardiology

## 2020-10-08 ENCOUNTER — Encounter: Payer: Self-pay | Admitting: Cardiology

## 2020-10-08 VITALS — BP 128/74 | HR 74 | Ht 65.0 in | Wt 182.0 lb

## 2020-10-08 DIAGNOSIS — I2510 Atherosclerotic heart disease of native coronary artery without angina pectoris: Secondary | ICD-10-CM | POA: Diagnosis not present

## 2020-10-08 DIAGNOSIS — Q211 Atrial septal defect: Secondary | ICD-10-CM | POA: Diagnosis not present

## 2020-10-08 DIAGNOSIS — I639 Cerebral infarction, unspecified: Secondary | ICD-10-CM

## 2020-10-08 DIAGNOSIS — G4733 Obstructive sleep apnea (adult) (pediatric): Secondary | ICD-10-CM

## 2020-10-08 DIAGNOSIS — I10 Essential (primary) hypertension: Secondary | ICD-10-CM

## 2020-10-08 DIAGNOSIS — Q2112 Patent foramen ovale: Secondary | ICD-10-CM

## 2020-10-08 DIAGNOSIS — E782 Mixed hyperlipidemia: Secondary | ICD-10-CM

## 2020-10-08 NOTE — Patient Instructions (Signed)
Medication Instructions:  Your physician recommends that you continue on your current medications as directed. Please refer to the Current Medication list given to you today.  *If you need a refill on your cardiac medications before your next appointment, please call your pharmacy*   Lab Work: None If you have labs (blood work) drawn today and your tests are completely normal, you will receive your results only by: MyChart Message (if you have MyChart) OR A paper copy in the mail If you have any lab test that is abnormal or we need to change your treatment, we will call you to review the results.   Testing/Procedures: Your physician has requested that you have an echocardiogram. Echocardiography is a painless test that uses sound waves to create images of your heart. It provides your doctor with information about the size and shape of your heart and how well your heart's chambers and valves are working. This procedure takes approximately one hour. There are no restrictions for this procedure.    Follow-Up: At [facility], you and your health needs are our priority.  As part of our continuing mission to provide you with exceptional heart care, we have created designated Provider Care Teams.  These Care Teams include your primary Cardiologist (physician) and Advanced Practice Providers (APPs -  Physician Assistants and Nurse Practitioners) who all work together to provide you with the care you need, when you need it.  We recommend signing up for the patient portal called "MyChart".  Sign up information is provided on this After Visit Summary.  MyChart is used to connect with patients for Virtual Visits (Telemedicine).  Patients are able to view lab/test results, encounter notes, upcoming appointments, etc.  Non-urgent messages can be sent to your provider as well.   To learn more about what you can do with MyChart, go to [email or web address].    Your next appointment:   6  month(s)  The format for your next appointment:   In Person  Provider:   Elease Hashimoto - Thomasene Ripple, DO    Other Instructions Echocardiogram An echocardiogram is a test that uses sound waves (ultrasound) to produce images of the heart. Images from an echocardiogram can provide important information about: Heart size and shape. The size and thickness and movement of your heart's walls. Heart muscle function and strength. Heart valve function or if you have stenosis. Stenosis is when the heart valves are too narrow. If blood is flowing backward through the heart valves (regurgitation). A tumor or infectious growth around the heart valves. Areas of heart muscle that are not working well because of poor blood flow or injury from a heart attack. Aneurysm detection. An aneurysm is a weak or damaged part of an artery wall. The wall bulges out from the normal force of blood pumping through the body. Tell a health care provider about: Any allergies you have. All medicines you are taking, including vitamins, herbs, eye drops, creams, and over-the-counter medicines. Any blood disorders you have. Any surgeries you have had. Any medical conditions you have. Whether you are pregnant or may be pregnant. What are the risks? Generally, this is a safe test. However, problems may occur, including an allergic reaction to dye (contrast) that may be used during the test. What happens before the test? No specific preparation is needed. You may eat and drink normally. What happens during the test?  You will take off your clothes from the waist up and put on a hospital gown. Electrodes or electrocardiogram (ECG)patches  may be placed on your chest. The electrodes or patches are then connected to a device that monitors your heart rate and rhythm. You will lie down on a table for an ultrasound exam. A gel will be applied to your chest to help sound waves pass through your skin. A handheld device, called a  transducer, will be pressed against your chest and moved over your heart. The transducer produces sound waves that travel to your heart and bounce back (or "echo" back) to the transducer. These sound waves will be captured in real-time and changed into images of your heart that can be viewed on a video monitor. The images will be recorded on a computer and reviewed by your health care provider. You may be asked to change positions or hold your breath for a short time. This makes it easier to get different views or better views of your heart. In some cases, you may receive contrast through an IV in one of your veins. This can improve the quality of the pictures from your heart. The procedure may vary among health care providers and hospitals. What can I expect after the test? You may return to your normal, everyday life, including diet, activities, andmedicines, unless your health care provider tells you not to do that. Follow these instructions at home: It is up to you to get the results of your test. Ask your health care provider, or the department that is doing the test, when your results will be ready. Keep all follow-up visits. This is important. Summary An echocardiogram is a test that uses sound waves (ultrasound) to produce images of the heart. Images from an echocardiogram can provide important information about the size and shape of your heart, heart muscle function, heart valve function, and other possible heart problems. You do not need to do anything to prepare before this test. You may eat and drink normally. After the echocardiogram is completed, you may return to your normal, everyday life, unless your health care provider tells you not to do that. This information is not intended to replace advice given to you by your health care provider. Make sure you discuss any questions you have with your healthcare provider. Document Revised: 11/07/2019 Document Reviewed: 11/07/2019 [employer]vier  Patient Education  2022 [employer].

## 2020-10-08 NOTE — Progress Notes (Signed)
Cardiology Office Note:    Date:  10/08/2020   ID:  [name], DOB Nov 15, 1950, MRN [id number]  PCP:  Hal Morales, NP  Cardiologist:  Thomasene Ripple, DO  Electrophysiologist:  None   Referring MD: Hal Morales, NP   Chief Complaint  Patient presents with   Follow-up    History of Present Illness:    Chloe Gray is a 70 y.o. female with a hx of hypertension, hyperlipidemia, multiple CVAs with the most recent CVA in December 2020, PFO status post closure with a 12 mm Amplatzer septal closure device on September 16, 2020 by Dr. Excell Seltzer, diastolic heart failure.   I did see the patient for the first time on July 11, 2019 at that time due to her strokes discuss ruling out cardiac embolic events.  We did do a TEE on this patient which showed 0.8 cm patent foramen ovale.  There was suspicion for left atrial appendage clot however CT was done which showed no evidence of clot.   She did wear a monitor for 14 days which showed no A. fib but due to history of cryptogenic stroke a loop recorder was more appropriate therefore I recommended patient see EP.  In the meantime I also asked the patient to see our structural clinic to be evaluated for PFO closure.   She is status post loop recorder placement on October 30, 2019 and her updated most recent device check on March 12, 2019 does not show any evidence of new atrial fibrillation.  I saw the patient on April 10, 2020 at that time she was still in sinus rhythm.  Since I last saw the patient she did follow-up with our structural team and is now status post PFO closure.  She is doing well since her PFO closure.  She is very happy that she underwent a procedure she tells me that her shortness of breath has improved greatly and her quality of life is improving.  No other complaints at this time peer   Past Medical History:  Diagnosis Date   Anxiety    Arthritis    Asthma    Cerebral aneurysm without rupture 10/14/2017   Cerebrovascular  accident (CVA) (HCC) 07/11/2019   Coronary artery disease involving native coronary artery of native heart without angina pectoris 10/10/2019   Cryptogenic stroke (HCC) 10/10/2019   Depression    states well controlled   Essential hypertension 07/11/2019   GERD (gastroesophageal reflux disease)    H/O hiatal hernia    Hypercholesterolemia    Hypertension    clearance note with OV Dr Lorin Picket, EKG on chart 03/09/11   MCI (mild cognitive impairment) 10/14/2017   Mixed hyperlipidemia 07/11/2019   OSA (obstructive sleep apnea) 07/11/2019   Osteoarthritis of right knee 05/01/2011   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   Pneumonia    Shortness of breath    with asthma   Sleep apnea    Stroke [facility])    Thalamic infarct, 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   Vitamin B 12 deficiency     Past Surgical History:  Procedure Laterality Date   COLONOSCOPY     KNEE ARTHROSCOPY     PATENT FORAMEN OVALE(PFO) CLOSURE N/A 09/16/2020   Procedure: PATENT FORAMEN OVALE (PFO) CLOSURE;  Surgeon: Tonny Bollman, MD;  Location: [facility] INVASIVE CV LAB;  Service: Cardiovascular;  Laterality: N/A;   TOTAL KNEE ARTHROPLASTY  04/30/2011   Procedure: TOTAL KNEE ARTHROPLASTY;  Surgeon: Javier Docker, MD;  Location: [facility] ORS;  Service: Orthopedics;  Laterality: Right;  Femoral nerve block   TUBAL LIGATION     UMBILICAL HERNIA REPAIR      Current Medications: Current Meds  Medication Sig   albuterol (PROVENTIL) (2.5 MG/3ML) 0.083% nebulizer solution Take 2.5 mg by nebulization every 6 (six) hours as needed for wheezing or shortness of breath. For shortness of breath.   ALPRAZolam (XANAX) 1 MG tablet Take 1 mg by mouth daily as needed for anxiety.   amLODipine (NORVASC) 10 MG tablet Take 10 mg by mouth daily.   aspirin EC 81 MG tablet Take 1 tablet (81 mg total) by mouth daily. Swallow whole.   buPROPion (WELLBUTRIN XL) 150 MG 24 hr tablet Take 1 tablet (150 mg total) by mouth daily.   citalopram (CELEXA) 20 MG tablet Take 20  mg by mouth daily.   clopidogrel (PLAVIX) 75 MG tablet Take 1 tablet (75 mg total) by mouth daily.   cyanocobalamin (,VITAMIN B-12,) 1000 MCG/ML injection Inject 100 mcg into the muscle every 30 (thirty) days.   famotidine (PEPCID) 20 MG tablet Take 20 mg by mouth at bedtime.   furosemide (LASIX) 20 MG tablet Take 20 mg by mouth daily as needed for fluid or edema.   lisinopril (ZESTRIL) 40 MG tablet Take 40 mg by mouth daily.   montelukast (SINGULAIR) 10 MG tablet Take 10 mg by mouth daily.   rosuvastatin (CRESTOR) 10 MG tablet Take 10 mg by mouth at bedtime.   sodium bicarbonate 650 MG tablet Take 1,300 mg by mouth 2 (two) times daily.   traZODone (DESYREL) 100 MG tablet Take 100 mg by mouth at bedtime as needed for sleep.     Allergies:   Latex   Social History   Socioeconomic History   Marital status: Married    Spouse name: Not on file   Number of children: Not on file   Years of education: Not on file   Highest education level: Not on file  Occupational History   Not on file  Tobacco Use   Smoking status: Never   Smokeless tobacco: Never  Vaping Use   Vaping Use: Never used  Substance and Sexual Activity   Alcohol use: No   Drug use: No   Sexual activity: Not on file  Other Topics Concern   Not on file  Social History Narrative   Not on file   Social Determinants of Health   Financial Resource Strain: Not on file  Food Insecurity: Not on file  Transportation Needs: Not on file  Physical Activity: Not on file  Stress: Not on file  Social Connections: Not on file     Family History: The patient's family history includes Hypertension in her brother, father, mother, sister, sister, and sister; Stroke in her mother.  ROS:   Review of Systems  Constitution: Negative for decreased appetite, fever and weight gain.  HENT: Negative for congestion, ear discharge, hoarse voice and sore throat.   Eyes: Negative for discharge, redness, vision loss in right eye and visual  halos.  Cardiovascular: Negative for chest pain, dyspnea on exertion, leg swelling, orthopnea and palpitations.  Respiratory: Negative for cough, hemoptysis, shortness of breath and snoring.   Endocrine: Negative for heat intolerance and polyphagia.  Hematologic/Lymphatic: Negative for bleeding problem. Does not bruise/bleed easily.  Skin: Negative for flushing, nail changes, rash and suspicious lesions.  Musculoskeletal: Negative for arthritis, joint pain, muscle cramps, myalgias, neck pain and stiffness.  Gastrointestinal: Negative for abdominal pain, bowel incontinence, diarrhea and excessive appetite.  Genitourinary:  Negative for decreased libido, genital sores and incomplete emptying.  Neurological: Negative for brief paralysis, focal weakness, headaches and loss of balance.  Psychiatric/Behavioral: Negative for altered mental status, depression and suicidal ideas.  Allergic/Immunologic: Negative for HIV exposure and persistent infections.    EKGs/Labs/Other Studies Reviewed:    The following studies were reviewed today:   EKG: None today  Gated coronary CTA 08/31/2019: Aorta: Normal size. Mild diffuse atherosclerotic plaque and calcifications. No dissection.   Aortic Valve:  Trileaflet.  Trivial calcifications.   Coronary Arteries:  Normal coronary origin.  Right dominance.   RCA is a medium caliber dominant artery that gives rise to PDA and PLA. There is minimal plaque, PDA and PLA are poorly visualized.   Left main is a short artery that gives rise to LAD and LCX arteries. Distal left main has mild calcified plaque with stenosis 25-49%.   LAD is a large vessel that has mild long calcified plaque in the proximal segment with associated stenosis 25-49%. This is followed by a short intramyocardial bridge. Mid and distal LAD have only minimal plaque.   LCX is a small lumen non-dominant artery that gives rise to one small OM1 branch. There is mild to moderate plaque. The  lumen is too small to evaluate.   Other findings:   Normal pulmonary vein drainage into the left atrium.   Normal left atrial appendage with two lobes and no evidence for a thrombus.   Normal size of the pulmonary artery.   IMPRESSION: 1. Coronary calcium score of 361. This was 5 percentile for age and sex matched control.   2. Normal coronary origin with right dominance.   3. 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 Consider preventive therapy and risk factor modification. Additional analysis with CT FFR will be submitted.   4. Normal left atrial appendage with two lobes and no evidence for a thrombus.   TEE 07/27/2019: [facility].  Normal LV size and systolic function with LVEF 55 to 60%.  There is concern for a small mobile mass in the left atrial appendage highly suspicious for thrombus, PFO is present measured at 0.8 cm with positive agitated saline study.  Recent Labs: 04/10/2020: Magnesium 2.1; TSH 3.770 09/16/2020: BUN 15; Creatinine, Ser 1.31; Hemoglobin 13.4; Platelets 278; Potassium 4.0; Sodium 133  Recent Lipid Panel    Component Value Date/Time   CHOL 146 10/14/2017 1033   TRIG 134 10/14/2017 1033   HDL 53 10/14/2017 1033   CHOLHDL 2.8 10/14/2017 1033   LDLCALC 66 10/14/2017 1033    Physical Exam:    VS:  BP 128/74 (BP Location: Left Arm, Patient Position: Sitting, Cuff Size: Normal)   Pulse 74   Ht  (1.651 m)   Wt 182 lb (82.6 kg)   SpO2 98%   BMI 30.29 kg/m     Wt Readings from Last 3 Encounters:  10/08/20 182 lb (82.6 kg)  09/16/20 177 lb (80.3 kg)  08/02/20 176 lb 12.8 oz (80.2 kg)     GEN: Well nourished, well developed in no acute distress HEENT: Normal NECK: No JVD; No carotid bruits LYMPHATICS: No lymphadenopathy CARDIAC: S1S2 noted,RRR, no murmurs, rubs, gallops RESPIRATORY:  Clear to auscultation without  rales, wheezing or rhonchi  ABDOMEN: Soft, non-tender, non-distended, +bowel sounds, no guarding. EXTREMITIES: No edema, No cyanosis, no clubbing MUSCULOSKELETAL:  No deformity  SKIN: Warm and dry  NEUROLOGIC:  Alert and oriented x 3, non-focal PSYCHIATRIC:  Normal affect, good insight  ASSESSMENT:    1. PFO with atrial septal aneurysm   2. Essential hypertension   3. Coronary artery disease involving native coronary artery of native heart without angina pectoris   4. Cryptogenic stroke (HCC)   5. OSA (obstructive sleep apnea)   6. Mixed hyperlipidemia    PLAN:     The appears to be doing well since her procedure.  For now we will continue on her aspirin and Plavix which she will take 6 months from the day of procedure and then we will take aspirin only.  We will get an echocardiogram as it does not appear that the patient was able to get her echocardiogram the day of her procedure.  Blood pressure is acceptable, continue with current antihypertensive regimen.  The patient understands the need to lose weight with diet and exercise. We have discussed specific strategies for this.  Hyperlipidemia - continue with current statin medication.  The patient is in agreement with the above plan. The patient left the office in stable condition.  The patient will follow up in   Medication Adjustments/Labs and Tests Ordered: Current medicines are reviewed at length with the patient today.  Concerns regarding medicines are outlined above.  Orders Placed This Encounter  Procedures   ECHOCARDIOGRAM COMPLETE   No orders of the defined types were placed in this encounter.   Patient Instructions  Medication Instructions:  Your physician recommends that you continue on your current medications as directed. Please refer to the Current Medication list given to you today.  *If you need a refill on your cardiac medications before your next appointment, please call your pharmacy*   Lab  Work: None If you have labs (blood work) drawn today and your tests are completely normal, you will receive your results only by: MyChart Message (if you have MyChart) OR A paper copy in the mail If you have any lab test that is abnormal or we need to change your treatment, we will call you to review the results.   Testing/Procedures: Your physician has requested that you have an echocardiogram. Echocardiography is a painless test that uses sound waves to create images of your heart. It provides your doctor with information about the size and shape of your heart and how well your heart's chambers and valves are working. This procedure takes approximately one hour. There are no restrictions for this procedure.    Follow-Up: At [facility], you and your health needs are our priority.  As part of our continuing mission to provide you with exceptional heart care, we have created designated Provider Care Teams.  These Care Teams include your primary Cardiologist (physician) and Advanced Practice Providers (APPs -  Physician Assistants and Nurse Practitioners) who all work together to provide you with the care you need, when you need it.  We recommend signing up for the patient portal called "MyChart".  Sign up information is provided on this After Visit Summary.  MyChart is used to connect with patients for Virtual Visits (Telemedicine).  Patients are able to view lab/test results, encounter notes, upcoming appointments, etc.  Non-urgent messages can be sent to your provider as well.   To learn more about what you can do with MyChart, go to [email or web address].    Your next appointment:   6 month(s)  The format for your next appointment:   In Person  Provider:   [address] [facility] [place] Nihaal Friesen, DO  Other Instructions Echocardiogram An echocardiogram is a test that uses sound waves (ultrasound) to produce images of the heart. Images from an echocardiogram can provide important  information about: Heart size and shape. The size and thickness and movement of your heart's walls. Heart muscle function and strength. Heart valve function or if you have stenosis. Stenosis is when the heart valves are too narrow. If blood is flowing backward through the heart valves (regurgitation). A tumor or infectious growth around the heart valves. Areas of heart muscle that are not working well because of poor blood flow or injury from a heart attack. Aneurysm detection. An aneurysm is a weak or damaged part of an artery wall. The wall bulges out from the normal force of blood pumping through the body. Tell a health care provider about: Any allergies you have. All medicines you are taking, including vitamins, herbs, eye drops, creams, and over-the-counter medicines. Any blood disorders you have. Any surgeries you have had. Any medical conditions you have. Whether you are pregnant or may be pregnant. What are the risks? Generally, this is a safe test. However, problems may occur, including an allergic reaction to dye (contrast) that may be used during the test. What happens before the test? No specific preparation is needed. You may eat and drink normally. What happens during the test?  You will take off your clothes from the waist up and put on a hospital gown. Electrodes or electrocardiogram (ECG)patches may be placed on your chest. The electrodes or patches are then connected to a device that monitors your heart rate and rhythm. You will lie down on a table for an ultrasound exam. A gel will be applied to your chest to help sound waves pass through your skin. A handheld device, called a transducer, will be pressed against your chest and moved over your heart. The transducer produces sound waves that travel to your heart and bounce back (or "echo" back) to the transducer. These sound waves will be captured in real-time and changed into images of your heart that can be viewed on a  video monitor. The images will be recorded on a computer and reviewed by your health care provider. You may be asked to change positions or hold your breath for a short time. This makes it easier to get different views or better views of your heart. In some cases, you may receive contrast through an IV in one of your veins. This can improve the quality of the pictures from your heart. The procedure may vary among health care providers and hospitals. What can I expect after the test? You may return to your normal, everyday life, including diet, activities, andmedicines, unless your health care provider tells you not to do that. Follow these instructions at home: It is up to you to get the results of your test. Ask your health care provider, or the department that is doing the test, when your results will be ready. Keep all follow-up visits. This is important. Summary An echocardiogram is a test that uses sound waves (ultrasound) to produce images of the heart. Images from an echocardiogram can provide important information about the size and shape of your heart, heart muscle function, heart valve function, and other possible heart problems. You do not need to do anything to prepare before this test. You may eat and drink normally. After the echocardiogram is completed, you may return to your normal, everyday life, unless your health care provider tells you not to do that. This information  is not intended to replace advice given to you by your health care provider. Make sure you discuss any questions you have with your healthcare provider. Document Revised: 11/07/2019 Document Reviewed: 11/07/2019 Elsevier Patient Education  2022 Elsevier Inc.    Adopting a Healthy Lifestyle.  Know what a healthy weight is for you (roughly BMI <25) and aim to maintain this   Aim for 7+ servings of fruits and vegetables daily   65-80+ fluid ounces of water or unsweet tea for healthy kidneys   Limit to max 1  drink of alcohol per day; avoid smoking/tobacco   Limit animal fats in diet for cholesterol and heart health - choose grass fed whenever available   Avoid highly processed foods, and foods high in saturated/trans fats   Aim for low stress - take time to unwind and care for your mental health   Aim for 150 min of moderate intensity exercise weekly for heart health, and weights twice weekly for bone health   Aim for 7-9 hours of sleep daily   When it comes to diets, agreement about the perfect plan isnt easy to find, even among the experts. Experts at the [employer] developed an idea known as the Healthy Eating Plate. Just imagine a plate divided into logical, healthy portions.   The emphasis is on diet quality:   Load up on vegetables and fruits - one-half of your plate: Aim for color and variety, and remember that potatoes dont count.   Go for whole grains - one-quarter of your plate: Whole wheat, barley, wheat berries, quinoa, oats, brown rice, and foods made with them. If you want pasta, go with whole wheat pasta.   Protein power - one-quarter of your plate: Fish, chicken, beans, and nuts are all healthy, versatile protein sources. Limit red meat.   The diet, however, does go beyond the plate, offering a few other suggestions.   Use healthy plant oils, such as olive, canola, soy, corn, sunflower and peanut. Check the labels, and avoid partially hydrogenated oil, which have unhealthy trans fats.   If youre thirsty, drink water. Coffee and tea are good in moderation, but skip sugary drinks and limit milk and dairy products to one or two daily servings.   The type of carbohydrate in the diet is more important than the amount. Some sources of carbohydrates, such as vegetables, fruits, whole grains, and beans-are healthier than others.   Finally, stay active  Signed, Thomasene RippleKardie Aleigh Grunden, DO  10/08/2020 2:07 PM    [facility]

## 2020-10-11 NOTE — Progress Notes (Signed)
Carelink Summary Report / Loop Recorder 

## 2020-10-16 ENCOUNTER — Ambulatory Visit: Payer: Medicare Other | Admitting: Physician Assistant

## 2020-10-16 NOTE — Progress Notes (Deleted)
[facility]   MULTIDISCIPLINARY HEART VALVE CLINIC                                     Cardiology Office Note:    Date:  10/16/2020   ID:  Chloe Gray, DOB [birth year], MRN [id number]  PCP:  Hal Morales, NP  [facility] Cardiologist:  Thomasene Ripple, DO  [facility] Electrophysiologist:  None   Referring MD: Hal Morales, NP   1 month s/p PFO closure.   History of Present Illness:    Chloe Gray is a 70 y.o. female with a hx of HTN, HLD, multiple CVAs with the most recent CVA in 12/ 2020, and PFO s/p percutaneous PFO closure on 09/16/20 who presents to clinic for follow up.  She was felt to have a cryptogenic CVA and underwent successful transcatheter PFO closure using a 12 mm Amplatzer septal occluder device on 09/16/20. She was discharged on aspirin and Plavix x 6 months. She was seen by Dr. Servando Salina for follow up on 10/08/20 and doing well. It was noted that she never got her post procedure echo and this was scheduled for 8/1.  Today she presents to clinic for follow up.   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   Depression    states well controlled   Essential hypertension 07/11/2019   GERD (gastroesophageal reflux disease)    H/O hiatal hernia    Hypercholesterolemia    Hypertension    clearance note with OV Dr Lorin Picket, EKG on chart 03/09/11   MCI (mild cognitive impairment) 10/14/2017   Mixed hyperlipidemia 07/11/2019   OSA (obstructive sleep apnea) 07/11/2019   Osteoarthritis of right knee 05/01/2011   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   Pneumonia    Shortness of breath    with asthma   Sleep apnea    Stroke [facility])    Thalamic infarct, 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    Vitamin B 12 deficiency     Past Surgical History:  Procedure Laterality Date   COLONOSCOPY     KNEE ARTHROSCOPY     PATENT FORAMEN OVALE(PFO) CLOSURE N/A 09/16/2020   Procedure: PATENT FORAMEN OVALE (PFO) CLOSURE;  Surgeon: Tonny Bollman, MD;  Location: [facility] INVASIVE CV LAB;  Service: Cardiovascular;  Laterality: N/A;   TOTAL KNEE ARTHROPLASTY  04/30/2011   Procedure: TOTAL KNEE ARTHROPLASTY;  Surgeon: Javier Docker, MD;  Location: [facility] ORS;  Service: Orthopedics;  Laterality: Right;  Femoral nerve block   TUBAL LIGATION     UMBILICAL HERNIA REPAIR      Current Medications: No outpatient medications have been marked as taking for the 10/16/20 encounter (Appointment) with Janetta Hora, PA-C.     Allergies:   Latex   Social History   Socioeconomic History   Marital status: Married    Spouse name: Not on file   Number of children: Not on file   Years of education: Not on file   Highest education level: Not on file  Occupational History   Not on file  Tobacco Use   Smoking status: Never   Smokeless tobacco: Never  Vaping Use  Vaping Use: Never used  Substance and Sexual Activity   Alcohol use: No   Drug use: No   Sexual activity: Not on file  Other Topics Concern   Not on file  Social History Narrative   Not on file   Social Determinants of Health   Financial Resource Strain: Not on file  Food Insecurity: Not on file  Transportation Needs: Not on file  Physical Activity: Not on file  Stress: Not on file  Social Connections: Not on file     Family History: The patient's family history includes Hypertension in her brother, father, mother, sister, sister, and sister; Stroke in her mother.  ROS:   Please see the history of present illness.    All other systems reviewed and are negative.  EKGs/Labs/Other Studies Reviewed:    The following studies were reviewed today: 09/16/20  PATENT FORAMEN OVALE (PFO) CLOSURE   Conclusion Successful transcatheter  PFO closure using a 12 mm Amplatzer septal occluder device   Recommend: Limited 2D echo prior to DC ASA/clopidogrel x 6 months, then long-term ASA 81 mg   EKG:  EKG is NOT ordered today.   Recent Labs: 04/10/2020: Magnesium 2.1; TSH 3.770 09/16/2020: BUN 15; Creatinine, Ser 1.31; Hemoglobin 13.4; Platelets 278; Potassium 4.0; Sodium 133  Recent Lipid Panel    Component Value Date/Time   CHOL 146 10/14/2017 1033   TRIG 134 10/14/2017 1033   HDL 53 10/14/2017 1033   CHOLHDL 2.8 10/14/2017 1033   LDLCALC 66 10/14/2017 1033     Risk Assessment/Calculations:       Physical Exam:    VS:  There were no vitals taken for this visit.    Wt Readings from Last 3 Encounters:  10/08/20 182 lb (82.6 kg)  09/16/20 177 lb (80.3 kg)  08/02/20 176 lb 12.8 oz (80.2 kg)     GEN: *** Well nourished, well developed in no acute distress HEENT: Normal NECK: No JVD; No carotid bruits LYMPHATICS: No lymphadenopathy CARDIAC: ***RRR, no murmurs, rubs, gallops RESPIRATORY:  Clear to auscultation without rales, wheezing or rhonchi  ABDOMEN: Soft, non-tender, non-distended MUSCULOSKELETAL:  No edema; No deformity  SKIN: Warm and dry NEUROLOGIC:  Alert and oriented x 3 PSYCHIATRIC:  Normal affect   ASSESSMENT:    1. S/P percutaneous patent foramen ovale closure   2. Essential hypertension   3. Mixed hyperlipidemia    PLAN:    In order of problems listed above:  PFO with atrial septal aneurysm s/p PFO closure:   HTN:  HLD:   Medication Adjustments/Labs and Tests Ordered: Current medicines are reviewed at length with the patient today.  Concerns regarding medicines are outlined above.  No orders of the defined types were placed in this encounter.  No orders of the defined types were placed in this encounter.   There are no Patient Instructions on file for this visit.   Signed, Cline Crock, PA-C  10/16/2020 11:32 AM    [facility]

## 2020-10-21 ENCOUNTER — Other Ambulatory Visit: Payer: Self-pay

## 2020-10-21 MED ORDER — AMOXICILLIN 500 MG PO TABS: ORAL_TABLET | ORAL | 0 refills | Status: DC

## 2020-10-26 LAB — CUP PACEART REMOTE DEVICE CHECK
Date Time Interrogation Session: 20220729230211
Implantable Pulse Generator Implant Date: 20210802

## 2020-10-28 ENCOUNTER — Ambulatory Visit (INDEPENDENT_AMBULATORY_CARE_PROVIDER_SITE_OTHER): Payer: Medicare Other

## 2020-10-28 ENCOUNTER — Other Ambulatory Visit: Payer: Self-pay

## 2020-10-28 DIAGNOSIS — I639 Cerebral infarction, unspecified: Secondary | ICD-10-CM

## 2020-10-28 DIAGNOSIS — Q211 Atrial septal defect: Secondary | ICD-10-CM | POA: Diagnosis not present

## 2020-10-28 DIAGNOSIS — Q2112 Patent foramen ovale: Secondary | ICD-10-CM

## 2020-10-28 LAB — ECHOCARDIOGRAM COMPLETE
Area-P 1/2: 5.84 cm2
S' Lateral: 2.5 cm

## 2020-11-01 ENCOUNTER — Telehealth: Payer: Self-pay | Admitting: Cardiology

## 2020-11-01 NOTE — Telephone Encounter (Signed)
Pt is returning a call in regards to results 

## 2020-11-01 NOTE — Telephone Encounter (Signed)
Called patient left message on personal voice mail I was returning your call.I will send message to [facility] triage.

## 2020-11-04 NOTE — Telephone Encounter (Signed)
Spoke with patient, see chart.    

## 2020-11-21 NOTE — Progress Notes (Signed)
Carelink Summary Report / Loop Recorder 

## 2020-11-28 ENCOUNTER — Ambulatory Visit (INDEPENDENT_AMBULATORY_CARE_PROVIDER_SITE_OTHER): Payer: Medicare Other

## 2020-11-28 DIAGNOSIS — I639 Cerebral infarction, unspecified: Secondary | ICD-10-CM | POA: Diagnosis not present

## 2020-12-03 LAB — CUP PACEART REMOTE DEVICE CHECK
Date Time Interrogation Session: 20220831230224
Implantable Pulse Generator Implant Date: 20210802

## 2020-12-10 NOTE — Progress Notes (Signed)
Carelink Summary Report / Loop Recorder 

## 2021-01-02 LAB — CUP PACEART REMOTE DEVICE CHECK
Date Time Interrogation Session: 20221003230612
Implantable Pulse Generator Implant Date: 20210802

## 2021-01-06 ENCOUNTER — Ambulatory Visit (INDEPENDENT_AMBULATORY_CARE_PROVIDER_SITE_OTHER): Payer: Medicare Other

## 2021-01-06 DIAGNOSIS — I639 Cerebral infarction, unspecified: Secondary | ICD-10-CM | POA: Diagnosis not present

## 2021-01-15 NOTE — Progress Notes (Signed)
Carelink Summary Report / Loop Recorder 

## 2021-02-06 LAB — CUP PACEART REMOTE DEVICE CHECK
Date Time Interrogation Session: 20221105231100
Implantable Pulse Generator Implant Date: 20210802

## 2021-02-10 ENCOUNTER — Ambulatory Visit (INDEPENDENT_AMBULATORY_CARE_PROVIDER_SITE_OTHER): Payer: Medicare Other

## 2021-02-10 DIAGNOSIS — I639 Cerebral infarction, unspecified: Secondary | ICD-10-CM

## 2021-02-18 NOTE — Progress Notes (Signed)
Carelink Summary Report / Loop Recorder 

## 2021-03-17 ENCOUNTER — Ambulatory Visit (INDEPENDENT_AMBULATORY_CARE_PROVIDER_SITE_OTHER): Payer: Medicare Other

## 2021-03-17 DIAGNOSIS — I639 Cerebral infarction, unspecified: Secondary | ICD-10-CM

## 2021-03-17 LAB — CUP PACEART REMOTE DEVICE CHECK
Date Time Interrogation Session: 20221217230620
Implantable Pulse Generator Implant Date: 20210802

## 2021-03-26 NOTE — Progress Notes (Signed)
Carelink Summary Report / Loop Recorder 

## 2021-03-31 DIAGNOSIS — Z20828 Contact with and (suspected) exposure to other viral communicable diseases: Secondary | ICD-10-CM | POA: Diagnosis not present

## 2021-03-31 DIAGNOSIS — R6883 Chills (without fever): Secondary | ICD-10-CM | POA: Diagnosis not present

## 2021-04-15 ENCOUNTER — Ambulatory Visit: Payer: Medicare Other | Admitting: Cardiology

## 2021-04-15 DIAGNOSIS — F3341 Major depressive disorder, recurrent, in partial remission: Secondary | ICD-10-CM | POA: Diagnosis not present

## 2021-04-15 DIAGNOSIS — F411 Generalized anxiety disorder: Secondary | ICD-10-CM | POA: Diagnosis not present

## 2021-04-17 DIAGNOSIS — R051 Acute cough: Secondary | ICD-10-CM | POA: Diagnosis not present

## 2021-04-17 DIAGNOSIS — Z20828 Contact with and (suspected) exposure to other viral communicable diseases: Secondary | ICD-10-CM | POA: Diagnosis not present

## 2021-04-17 DIAGNOSIS — M791 Myalgia, unspecified site: Secondary | ICD-10-CM | POA: Diagnosis not present

## 2021-04-17 DIAGNOSIS — J0100 Acute maxillary sinusitis, unspecified: Secondary | ICD-10-CM | POA: Diagnosis not present

## 2021-04-21 ENCOUNTER — Ambulatory Visit (INDEPENDENT_AMBULATORY_CARE_PROVIDER_SITE_OTHER): Payer: Medicare PPO

## 2021-04-21 DIAGNOSIS — I639 Cerebral infarction, unspecified: Secondary | ICD-10-CM | POA: Diagnosis not present

## 2021-04-21 LAB — CUP PACEART REMOTE DEVICE CHECK
Date Time Interrogation Session: 20230122230803
Implantable Pulse Generator Implant Date: 20210802

## 2021-05-02 ENCOUNTER — Telehealth: Payer: Self-pay

## 2021-05-02 NOTE — Telephone Encounter (Signed)
Pre-op cardiac pharmacy request to hold Aspirin and Plavix, wanting to stop 05/08/21, before colonoscopy.   The patient has h/o HTN, HLD, multiple CVAs, PFO s/p closure with a 12 mm Amplatzer septal closure on June 2,2922. Recommendations were for continuation of Aspirin and Plavix for 6 months from the day of the procedure, followed by Aspirin alone.   Dr. Servando Salina, please comment about holding Aspirin/Plaivx on 05/08/21  Please forward message back to P CV DIV PREOP

## 2021-05-02 NOTE — Progress Notes (Signed)
Carelink Summary Report / Loop Recorder 

## 2021-05-02 NOTE — Telephone Encounter (Signed)
° °  Pre-operative Risk Assessment    Patient Name: Chloe Gray  DOB: 1950-07-15 MRN: [id number]      Request for Surgical Clearance    Procedure:   COLONOSCOPY 05-13-21  Date of Surgery:  Clearance 05/13/21                                 Surgeon:  Laurell Roof, MD Surgeon's Group or Practice Name:  [facility] Phone number:  [phone] Fax number:  [phone]   Type of Clearance Requested:   - Pharmacy:  Hold Aspirin and Clopidogrel (Plavix) STOP 05-08-21   Type of Anesthesia:   NOT LISTED   Additional requests/questions:

## 2021-05-07 ENCOUNTER — Other Ambulatory Visit: Payer: Self-pay

## 2021-05-07 ENCOUNTER — Encounter: Payer: Self-pay | Admitting: Cardiology

## 2021-05-07 ENCOUNTER — Ambulatory Visit: Payer: Medicare PPO | Admitting: Cardiology

## 2021-05-07 VITALS — BP 110/80 | HR 74 | Ht 65.0 in | Wt 182.2 lb

## 2021-05-07 DIAGNOSIS — R7303 Prediabetes: Secondary | ICD-10-CM | POA: Diagnosis not present

## 2021-05-07 DIAGNOSIS — I10 Essential (primary) hypertension: Secondary | ICD-10-CM | POA: Diagnosis not present

## 2021-05-07 DIAGNOSIS — I253 Aneurysm of heart: Secondary | ICD-10-CM

## 2021-05-07 DIAGNOSIS — Z8774 Personal history of (corrected) congenital malformations of heart and circulatory system: Secondary | ICD-10-CM

## 2021-05-07 DIAGNOSIS — E669 Obesity, unspecified: Secondary | ICD-10-CM

## 2021-05-07 DIAGNOSIS — E782 Mixed hyperlipidemia: Secondary | ICD-10-CM

## 2021-05-07 DIAGNOSIS — I639 Cerebral infarction, unspecified: Secondary | ICD-10-CM

## 2021-05-07 DIAGNOSIS — Q2112 Patent foramen ovale: Secondary | ICD-10-CM

## 2021-05-07 DIAGNOSIS — I2510 Atherosclerotic heart disease of native coronary artery without angina pectoris: Secondary | ICD-10-CM

## 2021-05-07 NOTE — Progress Notes (Signed)
Cardiology Office Note:    Date:  05/07/2021   ID:  Chloe Gray, DOB 01-10-1951, MRN [id number]  PCP:  Charlynn Court, NP  Cardiologist:  Berniece Salines, DO  Electrophysiologist:  None   Referring MD: Charlynn Court, NP   " I am doing file"  History of Present Illness:    Chloe Gray is a 71 y.o. female with a hx of most recent CVA in December 2020, PFO status post closure with a 12 mm Amplatzer septal closure device on September 16, 2020 by Dr. Burt Knack, diastolic heart failure.    I did see the patient for the first time on July 11, 2019 at that time due to her strokes discuss ruling out cardiac embolic events.  We did do a TEE on this patient which showed 0.8 cm patent foramen ovale.  There was suspicion for left atrial appendage clot however CT was done which showed no evidence of clot.   She did wear a monitor for 14 days which showed no A. fib but due to history of cryptogenic stroke a loop recorder was more appropriate therefore I recommended patient see EP.  In the meantime I also asked the patient to see our structural clinic to be evaluated for PFO closure.   She is status post loop recorder placement on October 30, 2019 and her updated most recent device check on March 12, 2019 does not show any evidence of new atrial fibrillation.   I saw the patient on April 10, 2020 at that time she was still in sinus rhythm.  Since I last saw the patient she did follow-up with our structural team and is now status post PFO closure.  At the last visit she was status post her PFO closure.  At that time she had been started on aspirin and Plavix..  That time I educated the patient on how long she will be on this.  We will get a repeat echocardiogram.  Since her visit she tells me she has been doing well    Past Medical History:  Diagnosis Date   Anxiety    Arthritis    Asthma    Cerebral aneurysm without rupture 10/14/2017   Cerebrovascular accident (CVA) ([place]) 07/11/2019   Coronary artery  disease involving native coronary artery of native heart without angina pectoris 10/10/2019   Cryptogenic stroke ([place]) 10/10/2019   Depression    states well controlled   Essential hypertension 07/11/2019   GERD (gastroesophageal reflux disease)    H/O hiatal hernia    Hypercholesterolemia    Hypertension    clearance note with [facility] Dr Nicki Reaper, EKG on chart 03/09/11   MCI (mild cognitive impairment) 10/14/2017   Mixed hyperlipidemia 07/11/2019   OSA (obstructive sleep apnea) 07/11/2019   Osteoarthritis of right knee 05/01/2011   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   Pneumonia    Shortness of breath    with asthma   Sleep apnea    Stroke [facility])    Thalamic infarct, acute ([place]) 10/14/2017   Vitamin B 12 deficiency     Past Surgical History:  Procedure Laterality Date   COLONOSCOPY     KNEE ARTHROSCOPY     PATENT FORAMEN OVALE(PFO) CLOSURE N/A 09/16/2020   Procedure: PATENT FORAMEN OVALE (PFO) CLOSURE;  Surgeon: Sherren Mocha, MD;  Location: [facility];  Service: Cardiovascular;  Laterality: N/A;   TOTAL KNEE ARTHROPLASTY  04/30/2011   Procedure: TOTAL KNEE ARTHROPLASTY;  Surgeon: Johnn Hai, MD;  Location: [facility];  Service: Orthopedics;  Laterality: Right;  Femoral nerve block   TUBAL LIGATION     UMBILICAL HERNIA REPAIR      Current Medications: Current Meds  Medication Sig   albuterol (PROVENTIL) (2.5 MG/3ML) 0.083% nebulizer solution Take 2.5 mg by nebulization every 6 (six) hours as needed for wheezing or shortness of breath. For shortness of breath.   ALPRAZolam (XANAX) 1 MG tablet Take 1 mg by mouth daily as needed for anxiety.   amLODipine (NORVASC) 10 MG tablet Take 10 mg by mouth daily.   aspirin EC 81 MG tablet Take 1 tablet (81 mg total) by mouth daily. Swallow whole.   buPROPion (WELLBUTRIN XL) 150 MG 24 hr tablet Take 1 tablet (150 mg total) by mouth daily.   citalopram (CELEXA) 20 MG tablet Take 20 mg by mouth daily.   cyanocobalamin (,VITAMIN  B-12,) 1000 MCG/ML injection Inject 100 mcg into the muscle every 30 (thirty) days.   famotidine (PEPCID) 20 MG tablet Take 20 mg by mouth at bedtime.   furosemide (LASIX) 20 MG tablet Take 20 mg by mouth daily as needed for fluid or edema.   lisinopril (ZESTRIL) 40 MG tablet Take 40 mg by mouth daily.   rosuvastatin (CRESTOR) 10 MG tablet Take 10 mg by mouth at bedtime.   sodium bicarbonate 650 MG tablet Take 1,300 mg by mouth 2 (two) times daily.   traZODone (DESYREL) 100 MG tablet Take 100 mg by mouth at bedtime as needed for sleep.   [DISCONTINUED] clopidogrel (PLAVIX) 75 MG tablet Take 1 tablet (75 mg total) by mouth daily.     Allergies:   Latex   Social History   Socioeconomic History   Marital status: Married    Spouse name: Not on file   Number of children: Not on file   Years of education: Not on file   Highest education level: Not on file  Occupational History   Not on file  Tobacco Use   Smoking status: Never   Smokeless tobacco: Never  Vaping Use   Vaping Use: Never used  Substance and Sexual Activity   Alcohol use: No   Drug use: No   Sexual activity: Not on file  Other Topics Concern   Not on file  Social History Narrative   Not on file   Social Determinants of Health   Financial Resource Strain: Not on file  Food Insecurity: Not on file  Transportation Needs: Not on file  Physical Activity: Not on file  Stress: Not on file  Social Connections: Not on file     Family History: The patient's family history includes Hypertension in her brother, father, mother, sister, sister, and sister; Stroke in her mother.  ROS:   Review of Systems  Constitution: Negative for decreased appetite, fever and weight gain.  HENT: Negative for congestion, ear discharge, hoarse voice and sore throat.   Eyes: Negative for discharge, redness, vision loss in right eye and visual halos.  Cardiovascular: Negative for chest pain, dyspnea on exertion, leg swelling, orthopnea and  palpitations.  Respiratory: Negative for cough, hemoptysis, shortness of breath and snoring.   Endocrine: Negative for heat intolerance and polyphagia.  Hematologic/Lymphatic: Negative for bleeding problem. Does not bruise/bleed easily.  Skin: Negative for flushing, nail changes, rash and suspicious lesions.  Musculoskeletal: Negative for arthritis, joint pain, muscle cramps, myalgias, neck pain and stiffness.  Gastrointestinal: Negative for abdominal pain, bowel incontinence, diarrhea and excessive appetite.  Genitourinary: Negative for decreased libido, genital sores and  incomplete emptying.  Neurological: Negative for brief paralysis, focal weakness, headaches and loss of balance.  Psychiatric/Behavioral: Negative for altered mental status, depression and suicidal ideas.  Allergic/Immunologic: Negative for HIV exposure and persistent infections.    EKGs/Labs/Other Studies Reviewed:    The following studies were reviewed today:   EKG:  The ekg ordered today demonstrates    TTE 10/2020 IMPRESSIONS     1. Left ventricular ejection fraction, by estimation, is 60 to 65%. The  left ventricle has normal function. The left ventricle has no regional  wall motion abnormalities. There is moderate concentric left ventricular  hypertrophy. Left ventricular  diastolic parameters are consistent with Grade I diastolic dysfunction  (impaired relaxation). The average left ventricular global longitudinal  strain is -10.2 %. The global longitudinal strain is abnormal.   2. Right ventricular systolic function is normal. The right ventricular  size is normal. There is normal pulmonary artery systolic pressure.   3. OCCLUDER AMPLATZER SEPTAL 12MM is well visualized on 4CA without  doppler shunt.   4. The mitral valve is normal in structure. No evidence of mitral valve  regurgitation. No evidence of mitral stenosis.   5. The aortic valve is tricuspid. Aortic valve regurgitation is not  visualized.  No aortic stenosis is present.   6. The inferior vena cava is normal in size with greater than 50%  respiratory variability, suggesting right atrial pressure of 3 mmHg.   FINDINGS   Left Ventricle: Left ventricular ejection fraction, by estimation, is 60  to 65%. The left ventricle has normal function. The left ventricle has no  regional wall motion abnormalities. The average left ventricular global  longitudinal strain is -10.2 %.  The global longitudinal strain is abnormal. The left ventricular internal  cavity size was normal in size. There is moderate concentric left  ventricular hypertrophy. Left ventricular diastolic parameters are  consistent with Grade I diastolic dysfunction  (impaired relaxation). Normal left ventricular filling pressure.   Right Ventricle: The right ventricular size is normal. No increase in  right ventricular wall thickness. Right ventricular systolic function is  normal. There is normal pulmonary artery systolic pressure. The tricuspid  regurgitant velocity is 2.73 m/s, and   with an assumed right atrial pressure of 3 mmHg, the estimated right  ventricular systolic pressure is 0000000 mmHg.   Left Atrium: Left atrial size was normal in size.   Right Atrium: Right atrial size was normal in size.   Pericardium: There is no evidence of pericardial effusion.   Mitral Valve: The mitral valve is normal in structure. No evidence of  mitral valve regurgitation. No evidence of mitral valve stenosis.   Tricuspid Valve: The tricuspid valve is normal in structure. Tricuspid  valve regurgitation is mild . No evidence of tricuspid stenosis.   Aortic Valve: The aortic valve is tricuspid. Aortic valve regurgitation is  not visualized. No aortic stenosis is present.   Pulmonic Valve: The pulmonic valve was normal in structure. Pulmonic valve  regurgitation is not visualized. No evidence of pulmonic stenosis.   Aorta: The aortic arch was not well visualized and the  aortic root and  ascending aorta are structurally normal, with no evidence of dilitation.   Venous: The pulmonary veins were not well visualized. The inferior vena  cava is normal in size with greater than 50% respiratory variability,  suggesting right atrial pressure of 3 mmHg.   IAS/Shunts: No atrial level shunt detected by color flow Doppler.   Recent Labs: 09/16/2020: BUN 15; Creatinine,  Ser 1.31; Hemoglobin 13.4; Platelets 278; Potassium 4.0; Sodium 133  Recent Lipid Panel    Component Value Date/Time   CHOL 146 10/14/2017 1033   TRIG 134 10/14/2017 1033   HDL 53 10/14/2017 1033   CHOLHDL 2.8 10/14/2017 1033   LDLCALC 66 10/14/2017 1033    Physical Exam:    VS:  BP 110/80 (BP Location: Left Arm, Patient Position: Sitting)    Pulse 74    Ht 5\' 5"  (1.651 m)    Wt 182 lb 3.2 oz (82.6 kg)    SpO2 97%    BMI 30.32 kg/m     Wt Readings from Last 3 Encounters:  05/07/21 182 lb 3.2 oz (82.6 kg)  10/08/20 182 lb (82.6 kg)  09/16/20 177 lb (80.3 kg)     GEN: Well nourished, well developed in no acute distress HEENT: Normal NECK: No JVD; No carotid bruits LYMPHATICS: No lymphadenopathy CARDIAC: S1S2 noted,RRR, no murmurs, rubs, gallops RESPIRATORY:  Clear to auscultation without rales, wheezing or rhonchi  ABDOMEN: Soft, non-tender, non-distended, +bowel sounds, no guarding. EXTREMITIES: No edema, No cyanosis, no clubbing MUSCULOSKELETAL:  No deformity  SKIN: Warm and dry NEUROLOGIC:  Alert and oriented x 3, non-focal PSYCHIATRIC:  Normal affect, good insight  ASSESSMENT:    1. S/P patent foramen ovale closure   2. Prediabetes   3. Essential hypertension   4. Coronary artery disease involving native coronary artery of native heart without angina pectoris   5. Cryptogenic stroke (HCC)   6. PFO with atrial septal aneurysm   7. Mixed hyperlipidemia   8. Obesity (BMI 30-39.9)    PLAN:      She appears to be doing well from a cardiovascular standpoint.  I am going to  stop the Plavix as she has completed her 6 months course post PFO closure.  She will remain on her aspirin 81 mg daily.  She is planning colonoscopy.  She can hold her aspirin for 5 days prior to procedure and restart this medication at the discretion of her physician performing the procedure.  She understands that this will just be an interruption for her procedure but she will have to go back on her aspirin 81 mg daily.  The patient does not have any unstable cardiac conditions.  Upon evaluation today, she can achieve 4 METs or greater without anginal symptoms.  According to [facility] and AHA guidelines, she requires no further cardiac workup prior to her noncardiac surgery and should be at acceptable risk.  Our service is available as necessary in the perioperative period.  Hyperlipidemia - continue with current statin medication.  Hypertension-blood pressure is acceptable, continue with current antihypertensive regimen.  The patient understands the need to lose weight with diet and exercise. We have discussed specific strategies for this.  The patient is in agreement with the above plan. The patient left the office in stable condition.  The patient will follow up in   Medication Adjustments/Labs and Tests Ordered: Current medicines are reviewed at length with the patient today.  Concerns regarding medicines are outlined above.  No orders of the defined types were placed in this encounter.  No orders of the defined types were placed in this encounter.   Patient Instructions  Medication Instructions:  Your physician has recommended you make the following change in your medication:  STOP: Plavix *If you need a refill on your cardiac medications before your next appointment, please call your pharmacy*   Lab Work: None If you have labs (blood work)  drawn today and your tests are completely normal, you will receive your results only by: MyChart Message (if you have MyChart) OR A paper copy  in the mail If you have any lab test that is abnormal or we need to change your treatment, we will call you to review the results.   Testing/Procedures: None   Follow-Up: At [facility], you and your health needs are our priority.  As part of our continuing mission to provide you with exceptional heart care, we have created designated Provider Care Teams.  These Care Teams include your primary Cardiologist (physician) and Advanced Practice Providers (APPs -  Physician Assistants and Nurse Practitioners) who all work together to provide you with the care you need, when you need it.  We recommend signing up for the patient portal called "MyChart".  Sign up information is provided on this After Visit Summary.  MyChart is used to connect with patients for Virtual Visits (Telemedicine).  Patients are able to view lab/test results, encounter notes, upcoming appointments, etc.  Non-urgent messages can be sent to your provider as well.   To learn more about what you can do with MyChart, go to [email or web address].    Your next appointment:   1 year(s)  The format for your next appointment:   In Person  Provider:   Berniece Salines, DO     Other Instructions  Okay to hold Aspirin for procedure. From a cardiovascular stand point you are cleared for the colonoscopy.    Adopting a Healthy Lifestyle.  Know what a healthy weight is for you (roughly BMI <25) and aim to maintain this   Aim for 7+ servings of fruits and vegetables daily   65-80+ fluid ounces of water or unsweet tea for healthy kidneys   Limit to max 1 drink of alcohol per day; avoid smoking/tobacco   Limit animal fats in diet for cholesterol and heart health - choose grass fed whenever available   Avoid highly processed foods, and foods high in saturated/trans fats   Aim for low stress - take time to unwind and care for your mental health   Aim for 150 min of moderate intensity exercise weekly for heart health, and weights  twice weekly for bone health   Aim for 7-9 hours of sleep daily   When it comes to diets, agreement about the perfect plan isnt easy to find, even among the experts. Experts at the [employer] developed an idea known as the Healthy Eating Plate. Just imagine a plate divided into logical, healthy portions.   The emphasis is on diet quality:   Load up on vegetables and fruits - one-half of your plate: Aim for color and variety, and remember that potatoes dont count.   Go for whole grains - one-quarter of your plate: Whole wheat, barley, wheat berries, quinoa, oats, brown rice, and foods made with them. If you want pasta, go with whole wheat pasta.   Protein power - one-quarter of your plate: Fish, chicken, beans, and nuts are all healthy, versatile protein sources. Limit red meat.   The diet, however, does go beyond the plate, offering a few other suggestions.   Use healthy plant oils, such as olive, canola, soy, corn, sunflower and peanut. Check the labels, and avoid partially hydrogenated oil, which have unhealthy trans fats.   If youre thirsty, drink water. Coffee and tea are good in moderation, but skip sugary drinks and limit milk and dairy products to one or two daily  servings.   The type of carbohydrate in the diet is more important than the amount. Some sources of carbohydrates, such as vegetables, fruits, whole grains, and beans-are healthier than others.   Finally, stay active  Signed, Berniece Salines, DO  05/07/2021 11:49 AM    [place]

## 2021-05-07 NOTE — Patient Instructions (Addendum)
Medication Instructions:  Your physician has recommended you make the following change in your medication:  STOP: Plavix *If you need a refill on your cardiac medications before your next appointment, please call your pharmacy*   Lab Work: None If you have labs (blood work) drawn today and your tests are completely normal, you will receive your results only by: MyChart Message (if you have MyChart) OR A paper copy in the mail If you have any lab test that is abnormal or we need to change your treatment, we will call you to review the results.   Testing/Procedures: None   Follow-Up: At [facility], you and your health needs are our priority.  As part of our continuing mission to provide you with exceptional heart care, we have created designated Provider Care Teams.  These Care Teams include your primary Cardiologist (physician) and Advanced Practice Providers (APPs -  Physician Assistants and Nurse Practitioners) who all work together to provide you with the care you need, when you need it.  We recommend signing up for the patient portal called "MyChart".  Sign up information is provided on this After Visit Summary.  MyChart is used to connect with patients for Virtual Visits (Telemedicine).  Patients are able to view lab/test results, encounter notes, upcoming appointments, etc.  Non-urgent messages can be sent to your provider as well.   To learn more about what you can do with MyChart, go to [email or web address].    Your next appointment:   1 year(s)  The format for your next appointment:   In Person  Provider:   Thomasene Ripple, DO     Other Instructions  Okay to hold Aspirin for procedure. From a cardiovascular stand point you are cleared for the colonoscopy.

## 2021-05-08 ENCOUNTER — Telehealth: Payer: Self-pay

## 2021-05-08 NOTE — Telephone Encounter (Signed)
Patient was seen yesterday in Dr. Howell Rucks office and was cleared for procedure with Dr. Jennye Boroughs.  Forwarded a copy of Dr. Mallory Shirk note to GI doctor.

## 2021-05-08 NOTE — Telephone Encounter (Signed)
° °  Pre-operative Risk Assessment    Patient Name: Chloe Gray  DOB: October 06, 1950 MRN: [id number]      Request for Surgical Clearance    Procedure:   Colonoscopy  Date of Surgery:  Clearance 05/13/21                                 Surgeon:  Tollie Pizza [place] Surgeon's Group or Practice Name:  [facility] Phone number:  [phone] Fax number:  [phone]   Type of Clearance Requested:   - Medical  - Pharmacy:  Hold Aspirin because Plavix was stop 05/07/21 in [facility] with Dr. Servando Salina.   Type of Anesthesia:  Not Indicated   Additional requests/questions:  Please advise surgeon/provider what medications should be held. Please fax a copy of Clearance to the surgeon's office.  Signed, Ivin Booty   05/08/2021, 10:49 AM

## 2021-05-26 ENCOUNTER — Ambulatory Visit (INDEPENDENT_AMBULATORY_CARE_PROVIDER_SITE_OTHER): Payer: Medicare PPO

## 2021-05-26 DIAGNOSIS — I639 Cerebral infarction, unspecified: Secondary | ICD-10-CM | POA: Diagnosis not present

## 2021-05-26 LAB — CUP PACEART REMOTE DEVICE CHECK
Date Time Interrogation Session: 20230226230214
Implantable Pulse Generator Implant Date: 20210802

## 2021-06-02 NOTE — Progress Notes (Signed)
Carelink Summary Report / Loop Recorder 

## 2021-06-30 ENCOUNTER — Ambulatory Visit (INDEPENDENT_AMBULATORY_CARE_PROVIDER_SITE_OTHER): Payer: Medicare PPO

## 2021-06-30 DIAGNOSIS — I639 Cerebral infarction, unspecified: Secondary | ICD-10-CM | POA: Diagnosis not present

## 2021-06-30 LAB — CUP PACEART REMOTE DEVICE CHECK
Date Time Interrogation Session: 20230331230715
Implantable Pulse Generator Implant Date: 20210802

## 2021-07-14 NOTE — Progress Notes (Signed)
Carelink Summary Report / Loop Recorder 

## 2021-07-24 ENCOUNTER — Telehealth: Payer: Self-pay

## 2021-07-24 DIAGNOSIS — Z8774 Personal history of (corrected) congenital malformations of heart and circulatory system: Secondary | ICD-10-CM

## 2021-07-24 DIAGNOSIS — I253 Aneurysm of heart: Secondary | ICD-10-CM

## 2021-07-24 DIAGNOSIS — I639 Cerebral infarction, unspecified: Secondary | ICD-10-CM

## 2021-07-24 NOTE — Telephone Encounter (Signed)
Spoke with the patient's daughter who drives her to appointments. ?Scheduled 1 year echo and office visit 09/17/2021. ?She was grateful for call and agrees with plan.  ?

## 2021-07-31 LAB — CUP PACEART REMOTE DEVICE CHECK
Date Time Interrogation Session: 20230503231534
Implantable Pulse Generator Implant Date: 20210802

## 2021-08-02 ENCOUNTER — Encounter (HOSPITAL_BASED_OUTPATIENT_CLINIC_OR_DEPARTMENT_OTHER): Payer: Self-pay | Admitting: Emergency Medicine

## 2021-08-02 ENCOUNTER — Emergency Department (HOSPITAL_BASED_OUTPATIENT_CLINIC_OR_DEPARTMENT_OTHER)
Admission: EM | Admit: 2021-08-02 | Discharge: 2021-08-02 | Disposition: A | Discharge status: Home / Self Care | Payer: Medicare PPO | Attending: Emergency Medicine | Admitting: Emergency Medicine

## 2021-08-02 ENCOUNTER — Other Ambulatory Visit: Payer: Self-pay

## 2021-08-02 ENCOUNTER — Emergency Department (HOSPITAL_BASED_OUTPATIENT_CLINIC_OR_DEPARTMENT_OTHER): Payer: Medicare PPO

## 2021-08-02 DIAGNOSIS — Z91040 Latex allergy status: Secondary | ICD-10-CM | POA: Diagnosis not present

## 2021-08-02 DIAGNOSIS — S5002XA Contusion of left elbow, initial encounter: Secondary | ICD-10-CM

## 2021-08-02 DIAGNOSIS — Z79899 Other long term (current) drug therapy: Secondary | ICD-10-CM | POA: Insufficient documentation

## 2021-08-02 DIAGNOSIS — X58XXXA Exposure to other specified factors, initial encounter: Secondary | ICD-10-CM | POA: Diagnosis not present

## 2021-08-02 DIAGNOSIS — Y92512 Supermarket, store or market as the place of occurrence of the external cause: Secondary | ICD-10-CM | POA: Diagnosis not present

## 2021-08-02 DIAGNOSIS — S5001XA Contusion of right elbow, initial encounter: Secondary | ICD-10-CM | POA: Insufficient documentation

## 2021-08-02 DIAGNOSIS — Z7982 Long term (current) use of aspirin: Secondary | ICD-10-CM | POA: Diagnosis not present

## 2021-08-02 DIAGNOSIS — S59901A Unspecified injury of right elbow, initial encounter: Secondary | ICD-10-CM | POA: Diagnosis present

## 2021-08-02 LAB — COMPREHENSIVE METABOLIC PANEL
ALT: 19 U/L (ref 0–44)
AST: 21 U/L (ref 15–41)
Albumin: 4 g/dL (ref 3.5–5.0)
Alkaline Phosphatase: 86 U/L (ref 38–126)
Anion gap: 5 (ref 5–15)
BUN: 16 mg/dL (ref 8–23)
CO2: 24 mmol/L (ref 22–32)
Calcium: 9.3 mg/dL (ref 8.9–10.3)
Chloride: 102 mmol/L (ref 98–111)
Creatinine, Ser: 1.06 mg/dL — ABNORMAL HIGH (ref 0.44–1.00)
GFR, Estimated: 57 mL/min — ABNORMAL LOW (ref >60)
Glucose, Bld: 92 mg/dL (ref 70–99)
Potassium: 4.3 mmol/L (ref 3.5–5.1)
Sodium: 131 mmol/L — ABNORMAL LOW (ref 135–145)
Total Bilirubin: 0.3 mg/dL (ref 0.3–1.2)
Total Protein: 7 g/dL (ref 6.5–8.1)

## 2021-08-02 LAB — CBC WITH DIFFERENTIAL/PLATELET
Abs Immature Granulocytes: 0.06 10*3/uL (ref 0.00–0.07)
Basophils Absolute: 0.1 10*3/uL (ref 0.0–0.1)
Basophils Relative: 1 %
Eosinophils Absolute: 0.6 10*3/uL — ABNORMAL HIGH (ref 0.0–0.5)
Eosinophils Relative: 7 %
HCT: 36 % (ref 36.0–46.0)
Hemoglobin: 12.6 g/dL (ref 12.0–15.0)
Immature Granulocytes: 1 %
Lymphocytes Relative: 21 %
Lymphs Abs: 1.9 10*3/uL (ref 0.7–4.0)
MCH: 33.6 pg (ref 26.0–34.0)
MCHC: 35 g/dL (ref 30.0–36.0)
MCV: 96 fL (ref 80.0–100.0)
Monocytes Absolute: 0.9 10*3/uL (ref 0.1–1.0)
Monocytes Relative: 10 %
Neutro Abs: 5.5 10*3/uL (ref 1.7–7.7)
Neutrophils Relative %: 60 %
Platelets: 282 10*3/uL (ref 150–400)
RBC: 3.75 MIL/uL — ABNORMAL LOW (ref 3.87–5.11)
RDW: 12 % (ref 11.5–15.5)
WBC: 9.1 10*3/uL (ref 4.0–10.5)
nRBC: 0 % (ref 0.0–0.2)

## 2021-08-02 LAB — PROTIME-INR
INR: 0.9 (ref 0.8–1.2)
Prothrombin Time: 12.4 seconds (ref 11.4–15.2)

## 2021-08-02 MED ORDER — IBUPROFEN 600 MG PO TABS: 600.0000 mg | ORAL_TABLET | Freq: Four times a day (QID) | ORAL | 0 refills | Status: AC | PRN

## 2021-08-02 NOTE — ED Notes (Signed)
ED Provider at bedside. 

## 2021-08-02 NOTE — ED Triage Notes (Signed)
Presents with right elbow pain , obvious bruising . Denies injury to it. Denies fall. On baby aspirin daily . ?

## 2021-08-02 NOTE — ED Provider Notes (Signed)
?[facility] EMERGENCY DEPARTMENT ?Provider Note ? ? ?CSN: [id number] ?Arrival date & time: 08/02/21  1526 ? ?  ? ?History ? ?Chief Complaint  ?Patient presents with  ? Elbow Pain  ? ? ?Chloe Gray is a 71 y.o. female. ? ?Patient presents with pain, swelling and ecchymosis to her right elbow onset around 2 PM while she was in the drugstore.  Denies any fall or trauma.  States it is no longer hurting but there is extensive swelling and bruising to her posterior right elbow.  She takes aspirin but no other blood thinners.  She is able to range it without difficulty.  No fevers, chills, nausea or vomiting.  No focal weakness, numbness or tingling.  No other joint pain.  No history of orthopedic issues.  He has never had this problem before.  No history of gout. ? ?The history is provided by the patient and a relative.  ? ?  ? ?Home Medications ?Prior to Admission medications   ?Medication Sig Start Date End Date Taking? Authorizing Provider  ?amoxicillin (AMOXIL) 500 MG tablet Take 4 tablets by mouth 1 hour prior to dental appointment ?Patient not taking: Reported on 05/07/2021 10/21/20   Janetta Horahompson, Kathryn R, PA-C  ?albuterol (PROVENTIL) (2.5 MG/3ML) 0.083% nebulizer solution Take 2.5 mg by nebulization every 6 (six) hours as needed for wheezing or shortness of breath. For shortness of breath.    [provider]  ?ALPRAZolam Prudy Feeler(XANAX) 1 MG tablet Take 1 mg by mouth daily as needed for anxiety.    [provider]  ?amLODipine (NORVASC) 10 MG tablet Take 10 mg by mouth daily.    [provider]  ?aspirin EC 81 MG tablet Take 1 tablet (81 mg total) by mouth daily. Swallow whole. 09/05/20   Tonny Bollmanooper, Michael, MD  ?buPROPion (WELLBUTRIN XL) 150 MG 24 hr tablet Take 1 tablet (150 mg total) by mouth daily. 09/16/20   Tonny Bollmanooper, Michael, MD  ?citalopram (CELEXA) 20 MG tablet Take 20 mg by mouth daily. 07/13/19   [provider]  ?cyanocobalamin (,VITAMIN B-12,) 1000 MCG/ML injection Inject 100 mcg  into the muscle every 30 (thirty) days.    [provider]  ?famotidine (PEPCID) 20 MG tablet Take 20 mg by mouth at bedtime. 07/13/20   [provider]  ?furosemide (LASIX) 20 MG tablet Take 20 mg by mouth daily as needed for fluid or edema. 09/24/17   [provider]  ?lisinopril (ZESTRIL) 40 MG tablet Take 40 mg by mouth daily. 01/08/19   [provider]  ?montelukast (SINGULAIR) 10 MG tablet Take 10 mg by mouth daily. ?Patient not taking: Reported on 05/07/2021 01/06/19   [provider]  ?rosuvastatin (CRESTOR) 10 MG tablet Take 10 mg by mouth at bedtime.    [provider]  ?sodium bicarbonate 650 MG tablet Take 1,300 mg by mouth 2 (two) times daily. 08/30/17   [provider]  ?traZODone (DESYREL) 100 MG tablet Take 100 mg by mouth at bedtime as needed for sleep.    [provider]  ?   ? ?Allergies    ?Latex   ? ?Review of Systems   ?Review of Systems  ?Constitutional:  Negative for activity change, appetite change and fever.  ?HENT:  Negative for congestion and rhinorrhea.   ?Respiratory:  Negative for cough, chest tightness and shortness of breath.   ?Cardiovascular:  Negative for chest pain.  ?Gastrointestinal:  Negative for abdominal pain, nausea and vomiting.  ?Genitourinary:  Negative for dysuria and  hematuria.  ?Musculoskeletal:  Positive for arthralgias and myalgias.  ?Skin:  Positive for wound.  ?Neurological:  Negative for dizziness, weakness and headaches.  ? all other systems are negative except as noted in the HPI and PMH.  ? ?Physical Exam ?Updated Vital Signs ?BP 132/72   Pulse 80   Temp 98.3 ?F (36.8 ?C) (Oral)   Resp 18   Ht 5\' 5"  (1.651 m)   Wt 77.1 kg   SpO2 97%   BMI 28.29 kg/m?  ?Physical Exam ?Vitals and nursing note reviewed.  ?Constitutional:   ?   General: She is not in acute distress. ?   Appearance: She is well-developed.  ?HENT:  ?   Head: Normocephalic and atraumatic.  ?   Mouth/Throat:  ?   Pharynx: No  oropharyngeal exudate.  ?Eyes:  ?   Conjunctiva/sclera: Conjunctivae normal.  ?   Pupils: Pupils are equal, round, and reactive to light.  ?Neck:  ?   Comments: No meningismus. ?Cardiovascular:  ?   Rate and Rhythm: Normal rate and regular rhythm.  ?   Heart sounds: Normal heart sounds. No murmur heard. ?Pulmonary:  ?   Effort: Pulmonary effort is normal. No respiratory distress.  ?   Breath sounds: Normal breath sounds.  ?Chest:  ?   Chest wall: No tenderness.  ?Abdominal:  ?   Palpations: Abdomen is soft.  ?   Tenderness: There is no abdominal tenderness. There is no guarding or rebound.  ?Musculoskeletal:     ?   General: Swelling and tenderness present. No signs of injury. Normal range of motion.  ?   Cervical back: Normal range of motion and neck supple.  ?   Comments: Large hematoma and ecchymosis to right olecranon bursa and posterior elbow.  Flexion and extension are intact.  Pronation and supination are intact.  Intact radial pulse.  No overlying warmth or erythema  ?Skin: ?   General: Skin is warm.  ?Neurological:  ?   Mental Status: She is alert and oriented to person, place, and time.  ?   Cranial Nerves: No cranial nerve deficit.  ?   Motor: No abnormal muscle tone.  ?   Coordination: Coordination normal.  ?   Comments:  5/5 strength throughout. CN 2-12 intact.Equal grip strength.   ?Psychiatric:     ?   Behavior: Behavior normal.  ? ? ?ED Results / Procedures / Treatments   ?Labs ?(all labs ordered are listed, but only abnormal results are displayed) ?Labs Reviewed  ?CBC WITH DIFFERENTIAL/PLATELET - Abnormal; Notable for the following components:  ?    Result Value  ? RBC 3.75 (*)   ? Eosinophils Absolute 0.6 (*)   ? All other components within normal limits  ?COMPREHENSIVE METABOLIC PANEL - Abnormal; Notable for the following components:  ? Sodium 131 (*)   ? Creatinine, Ser 1.06 (*)   ? GFR, Estimated 57 (*)   ? All other components within normal limits  ?PROTIME-INR  ? ? ?EKG ?None ? ?Radiology ?DG  Elbow Complete Right ? ?Result Date: 08/02/2021 ?CLINICAL DATA:  RIGHT elbow bruising and swelling today. No known injury. EXAM: RIGHT ELBOW - COMPLETE 4 VIEW COMPARISON:  None Available. FINDINGS: There is no evidence of acute fracture, subluxation or dislocation. No joint effusion is noted. Moderate soft tissue swelling overlying the olecranon is noted. IMPRESSION: Moderate posterior soft tissue swelling. No acute bony abnormality or joint effusion. Electronically Signed   By: 10/02/2021 M.D.   On: 08/02/2021  16:20  ? ?CT Elbow Right Wo Contrast ? ?Result Date: 08/02/2021 ?CLINICAL DATA:  Elbow trauma ecchymosis, swelling, hematoma over olecranon bursa. No known injury EXAM: CT OF THE UPPER RIGHT EXTREMITY WITHOUT CONTRAST TECHNIQUE: Multidetector CT imaging of the upper right extremity was performed according to the standard protocol. RADIATION DOSE REDUCTION: This exam was performed according to the departmental dose-optimization program which includes automated exposure control, adjustment of the mA and/or kV according to patient size and/or use of iterative reconstruction technique. COMPARISON:  X-ray 08/02/2021 FINDINGS: Bones/Joint/Cartilage No acute fracture. No dislocation. No significant arthropathy of the elbow joint. No elbow joint effusion. Mild enthesopathic changes at the lateral humeral epicondyle. No bone erosion or periosteal elevation. Ligaments Suboptimally assessed by CT. Muscles and Tendons Musculotendinous structures appear within normal limits by CT. Distal triceps tendon appears intact. Soft tissues Hyperdense collection within the superficial soft tissues of the posterior elbow overlying the olecranon process measuring approximately 3.5 x 1.9 x 2.9 cm. There is surrounding ill-defined fluid within the soft tissues posteriorly extending into the proximal elbow, also measuring above fluid density. IMPRESSION: 1. Focal hyperdense collection within the superficial soft tissues of the posterior  elbow overlying the olecranon process measuring up to 3.5 cm with appearance most suggestive of a hematoma. Surrounding ill-defined hyperdense fluid within the adjacent soft tissues, also likely blood products. 2.

## 2021-08-02 NOTE — Discharge Instructions (Signed)
Use ice, elevate, take the anti-inflammatories as prescribed.  Wear the Ace wrap loosely to help compress the area of swelling.  Follow-up with Dr. Carola Frost in the office for recheck.  Return to the ED for worsening pain, weakness, numbness, tingling, any other concerns ?

## 2021-08-02 NOTE — ED Notes (Signed)
Ace wrap applied and Pt given ice pack to use as needed. ?

## 2021-08-04 ENCOUNTER — Ambulatory Visit (INDEPENDENT_AMBULATORY_CARE_PROVIDER_SITE_OTHER): Payer: Medicare PPO

## 2021-08-04 DIAGNOSIS — I639 Cerebral infarction, unspecified: Secondary | ICD-10-CM

## 2021-08-26 NOTE — Progress Notes (Signed)
Carelink Summary Report / Loop Recorder 

## 2021-09-08 ENCOUNTER — Ambulatory Visit (INDEPENDENT_AMBULATORY_CARE_PROVIDER_SITE_OTHER): Payer: Medicare PPO

## 2021-09-08 DIAGNOSIS — I639 Cerebral infarction, unspecified: Secondary | ICD-10-CM

## 2021-09-09 LAB — CUP PACEART REMOTE DEVICE CHECK
Date Time Interrogation Session: 20230605231548
Implantable Pulse Generator Implant Date: 20210802

## 2021-09-16 NOTE — Progress Notes (Unsigned)
[facility]   MULTIDISCIPLINARY HEART VALVE CLINIC                                     Cardiology Office Note:    Date:  09/17/2021   ID:  Chloe Gray, DOB 07/16/1950, MRN [id number]  PCP:  Jerrye Bushy, FNP  [facility] Cardiologist:  Thomasene Ripple, DO  [facility] Electrophysiologist:  None   Referring MD: Hal Morales, NP   Chief Complaint  Patient presents with   Follow-up    1 year s/p PFO closure    History of Present Illness:    Chloe Gray is a 71 y.o. female with a hx of PFO s/p closure 09/16/20. Also hx of multiple CVAs, anxiety, HTN, GERD, OSA, and CAD. She was initially referred to Dr. Excell Seltzer 08/2019 for evaluation of transcatheter PFO closure. At that time, she had a history of multiple strokes with residual deficits with memory, speech, and gait instability.  She had loop recorder implantation and also TEE demonstrating a moderate to large PFO with positive bubble study.  After discussion of PFO closure, plan was to continue monitoring her loop for possible AF as Dr. Excell Seltzer felt there was a high likelihood that she had paroxysmal atrial fibrillation as a cause of her strokes.   She was last seen by the structural heart team 09/16/2020 at which time the plan was to proceed with PFO closure. Her loop recordings were reviewed with no evidence of PAF and no indication to continue long term AC for CVA prevention.   She underwent closure 09/16/20 with 40mm Amplatzer occluder device and tolerated the procedure well. Recommendations were to continue uninterrupted DAPT with Aspirin 81mg  daily and Clopidogrel 75mg  daily for a minimum of 6 months.   She was last seen by her primary cardiologist 05/07/21 and was doing very well. Today she is here alone and denies chest pain, palpitations, SOB, dizziness, new neuro changes, or syncope.   Past Medical History:  Diagnosis Date   Anxiety    Arthritis    Asthma    Cerebral aneurysm without rupture 10/14/2017    Cerebrovascular accident (CVA) (HCC) 07/11/2019   Coronary artery disease involving native coronary artery of native heart without angina pectoris 10/10/2019   Cryptogenic stroke (HCC) 10/10/2019   Depression    states well controlled   Essential hypertension 07/11/2019   GERD (gastroesophageal reflux disease)    H/O hiatal hernia    Hypercholesterolemia    Hypertension    clearance note with OV Dr 10/12/2019, EKG on chart 03/09/11   MCI (mild cognitive impairment) 10/14/2017   Mixed hyperlipidemia 07/11/2019   OSA (obstructive sleep apnea) 07/11/2019   Osteoarthritis of right knee 05/01/2011   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   Pneumonia    Shortness of breath    with asthma   Sleep apnea    Stroke [facility])    Thalamic infarct, 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   Vitamin B 12 deficiency     Past Surgical History:  Procedure Laterality Date   COLONOSCOPY     KNEE ARTHROSCOPY     PATENT FORAMEN OVALE(PFO) CLOSURE N/A 09/16/2020   Procedure: PATENT FORAMEN OVALE (PFO) CLOSURE;  Surgeon: 10/16/2017, MD;  Location: [facility] INVASIVE CV LAB;  Service: Cardiovascular;  Laterality: N/A;   TOTAL KNEE ARTHROPLASTY  04/30/2011   Procedure: TOTAL KNEE ARTHROPLASTY;  Surgeon: [facility], MD;  Location: [facility] ORS;  Service: Orthopedics;  Laterality: Right;  Femoral nerve block   TUBAL LIGATION     UMBILICAL HERNIA REPAIR      Current Medications: Current Meds  Medication Sig   albuterol (PROVENTIL) (2.5 MG/3ML) 0.083% nebulizer solution Take 2.5 mg by nebulization every 6 (six) hours as needed for wheezing or shortness of breath. For shortness of breath.   ALPRAZolam (XANAX) 1 MG tablet Take 1 mg by mouth daily as needed for anxiety.   amLODipine (NORVASC) 10 MG tablet Take 10 mg by mouth daily.   amoxicillin (AMOXIL) 500 MG tablet Take 4 tablets by mouth 1 hour prior to dental appointment   aspirin EC 81 MG tablet Take 1 tablet (81 mg total) by mouth daily. Swallow whole.   buPROPion (WELLBUTRIN  XL) 150 MG 24 hr tablet Take 1 tablet (150 mg total) by mouth daily.   citalopram (CELEXA) 20 MG tablet Take 20 mg by mouth daily.   cyanocobalamin (,VITAMIN B-12,) 1000 MCG/ML injection Inject 100 mcg into the muscle every 30 (thirty) days.   famotidine (PEPCID) 20 MG tablet Take 20 mg by mouth at bedtime.   furosemide (LASIX) 20 MG tablet Take 20 mg by mouth daily as needed for fluid or edema.   ibuprofen (ADVIL) 600 MG tablet Take 1 tablet (600 mg total) by mouth every 6 (six) hours as needed for moderate pain.   lisinopril (ZESTRIL) 40 MG tablet Take 40 mg by mouth daily.   montelukast (SINGULAIR) 10 MG tablet Take 10 mg by mouth daily.   rosuvastatin (CRESTOR) 10 MG tablet Take 10 mg by mouth at bedtime.   sodium bicarbonate 650 MG tablet Take 1,300 mg by mouth 2 (two) times daily.   traZODone (DESYREL) 100 MG tablet Take 100 mg by mouth at bedtime as needed for sleep.     Allergies:   Latex   Social History   Socioeconomic History   Marital status: Married    Spouse name: Not on file   Number of children: Not on file   Years of education: Not on file   Highest education level: Not on file  Occupational History   Not on file  Tobacco Use   Smoking status: Never   Smokeless tobacco: Never  Vaping Use   Vaping Use: Never used  Substance and Sexual Activity   Alcohol use: No   Drug use: No   Sexual activity: Not on file  Other Topics Concern   Not on file  Social History Narrative   Not on file   Social Determinants of Health   Financial Resource Strain: Not on file  Food Insecurity: Not on file  Transportation Needs: Not on file  Physical Activity: Not on file  Stress: Not on file  Social Connections: Not on file     Family History: The patient's family history includes Hypertension in her brother, father, mother, sister, sister, and sister; Stroke in her mother.  ROS:   Please see the history of present illness.    All other systems reviewed and are  negative.  EKGs/Labs/Other Studies Reviewed:    The following studies were reviewed today:   Limited echo with bubble 09/17/20:     LAAO closure 09/16/20:  Successful transcatheter PFO closure using a 12 mm Amplatzer septal occluder device   Recommend: Limited 2D echo prior to DC ASA/clopidogrel x 6 months, then long-term ASA 81 mg  EKG:  EKG is not ordered today.   Recent Labs: 08/02/2021: ALT 19;  BUN 16; Creatinine, Ser 1.06; Hemoglobin 12.6; Platelets 282; Potassium 4.3; Sodium 131   Recent Lipid Panel    Component Value Date/Time   CHOL 146 10/14/2017 1033   TRIG 134 10/14/2017 1033   HDL 53 10/14/2017 1033   CHOLHDL 2.8 10/14/2017 1033   LDLCALC 66 10/14/2017 1033   Physical Exam:    VS:  BP 100/70 (BP Location: Left Arm, Patient Position: Sitting, Cuff Size: Normal)   Pulse 78   Ht 5\' 5"  (1.651 m)   Wt 173 lb (78.5 kg)   SpO2 98%   BMI 28.79 kg/m     Wt Readings from Last 3 Encounters:  09/17/21 173 lb (78.5 kg)  08/02/21 170 lb (77.1 kg)  05/07/21 182 lb 3.2 oz (82.6 kg)    General: Well developed, well nourished, NAD Skin: Warm, dry, intact  Lungs:Clear to ausculation bilaterally. Breathing is unlabored. Cardiovascular: RRR with S1 S2. No murmurs Neuro: Alert and oriented.  Psych: Responds to questions appropriately with normal affect.    ASSESSMENT/PLAN:    PFO closure: Underwent PFO closure with 12 mm Amplatzer septal occluder device on 09/16/20 with Dr. 09/18/20. She has done well and has already transitioned off Plavix. She will no longer need SBE prophylaxis. Limited echo with bubble today shows  Loop recorder with no evidence of AF thus far.    CVA: No new neuro changes.   HTN: Stable, 100/70. No changes today.   HLD: Last LDL, 66 from 09/2017. See Dr. 10/2017 04/2022.   Medication Adjustments/Labs and Tests Ordered: Current medicines are reviewed at length with the patient today.  Concerns regarding medicines are outlined above.  No orders of  the defined types were placed in this encounter.  No orders of the defined types were placed in this encounter.   Patient Instructions  Medication Instructions:  Your physician has recommended you make the following change in your medication:  STOP AMOXICILLIN   *If you need a refill on your cardiac medications before your next appointment, please call your pharmacy*   Lab Work: NONE If you have labs (blood work) drawn today and your tests are completely normal, you will receive your results only by: MyChart Message (if you have MyChart) OR A paper copy in the mail If you have any lab test that is abnormal or we need to change your treatment, we will call you to review the results.   Testing/Procedures: NONE   Follow-Up: At [facility], you and your health needs are our priority.  As part of our continuing mission to provide you with exceptional heart care, we have created designated Provider Care Teams.  These Care Teams include your primary Cardiologist (physician) and Advanced Practice Providers (APPs -  Physician Assistants and Nurse Practitioners) who all work together to provide you with the care you need, when you need it.  We recommend signing up for the patient portal called "MyChart".  Sign up information is provided on this After Visit Summary.  MyChart is used to connect with patients for Virtual Visits (Telemedicine).  Patients are able to view lab/test results, encounter notes, upcoming appointments, etc.  Non-urgent messages can be sent to your provider as well.   To learn more about what you can do with MyChart, go to [facility].    Your next appointment:   KEEP SCHEDULE FOLLOW-UP   Important Information About Sugar         Signed, [email or web address], NP  09/17/2021 10:51 AM    [facility] 

## 2021-09-17 ENCOUNTER — Ambulatory Visit (INDEPENDENT_AMBULATORY_CARE_PROVIDER_SITE_OTHER): Payer: Medicare PPO | Admitting: Cardiology

## 2021-09-17 ENCOUNTER — Ambulatory Visit (HOSPITAL_COMMUNITY): Payer: Medicare PPO | Attending: Cardiology

## 2021-09-17 VITALS — BP 100/70 | HR 78 | Ht 65.0 in | Wt 173.0 lb

## 2021-09-17 DIAGNOSIS — E7800 Pure hypercholesterolemia, unspecified: Secondary | ICD-10-CM

## 2021-09-17 DIAGNOSIS — Z8774 Personal history of (corrected) congenital malformations of heart and circulatory system: Secondary | ICD-10-CM

## 2021-09-17 DIAGNOSIS — I253 Aneurysm of heart: Secondary | ICD-10-CM

## 2021-09-17 DIAGNOSIS — I639 Cerebral infarction, unspecified: Secondary | ICD-10-CM | POA: Insufficient documentation

## 2021-09-17 DIAGNOSIS — I10 Essential (primary) hypertension: Secondary | ICD-10-CM | POA: Diagnosis not present

## 2021-09-17 DIAGNOSIS — Q2112 Patent foramen ovale: Secondary | ICD-10-CM | POA: Insufficient documentation

## 2021-09-17 LAB — ECHOCARDIOGRAM LIMITED BUBBLE STUDY
Area-P 1/2: 4.19 cm2
S' Lateral: 2.1 cm

## 2021-09-17 NOTE — Patient Instructions (Signed)
Medication Instructions:  Your physician has recommended you make the following change in your medication:  STOP AMOXICILLIN   *If you need a refill on your cardiac medications before your next appointment, please call your pharmacy*   Lab Work: NONE If you have labs (blood work) drawn today and your tests are completely normal, you will receive your results only by: MyChart Message (if you have MyChart) OR A paper copy in the mail If you have any lab test that is abnormal or we need to change your treatment, we will call you to review the results.   Testing/Procedures: NONE   Follow-Up: At [facility], you and your health needs are our priority.  As part of our continuing mission to provide you with exceptional heart care, we have created designated Provider Care Teams.  These Care Teams include your primary Cardiologist (physician) and Advanced Practice Providers (APPs -  Physician Assistants and Nurse Practitioners) who all work together to provide you with the care you need, when you need it.  We recommend signing up for the patient portal called "MyChart".  Sign up information is provided on this After Visit Summary.  MyChart is used to connect with patients for Virtual Visits (Telemedicine).  Patients are able to view lab/test results, encounter notes, upcoming appointments, etc.  Non-urgent messages can be sent to your provider as well.   To learn more about what you can do with MyChart, go to [email or web address].    Your next appointment:   KEEP SCHEDULE FOLLOW-UP   Important Information About Sugar

## 2021-09-18 ENCOUNTER — Other Ambulatory Visit: Payer: Self-pay | Admitting: Cardiology

## 2021-09-18 DIAGNOSIS — I639 Cerebral infarction, unspecified: Secondary | ICD-10-CM

## 2021-09-18 DIAGNOSIS — Q2112 Patent foramen ovale: Secondary | ICD-10-CM

## 2021-09-25 NOTE — Progress Notes (Signed)
Carelink Summary Report / Loop Recorder 

## 2021-10-09 ENCOUNTER — Other Ambulatory Visit: Payer: Self-pay | Admitting: Cardiovascular Disease

## 2021-10-09 LAB — CUP PACEART REMOTE DEVICE CHECK
Date Time Interrogation Session: 20230708230605
Implantable Pulse Generator Implant Date: 20210802

## 2021-10-13 ENCOUNTER — Ambulatory Visit (INDEPENDENT_AMBULATORY_CARE_PROVIDER_SITE_OTHER): Payer: Medicare PPO

## 2021-10-13 DIAGNOSIS — I639 Cerebral infarction, unspecified: Secondary | ICD-10-CM | POA: Diagnosis not present

## 2021-11-13 NOTE — Progress Notes (Signed)
Carelink Summary Report / Loop Recorder 

## 2021-11-17 ENCOUNTER — Ambulatory Visit (INDEPENDENT_AMBULATORY_CARE_PROVIDER_SITE_OTHER): Payer: Medicare PPO

## 2021-11-17 DIAGNOSIS — I639 Cerebral infarction, unspecified: Secondary | ICD-10-CM | POA: Diagnosis not present

## 2021-11-18 LAB — CUP PACEART REMOTE DEVICE CHECK
Date Time Interrogation Session: 20230820230939
Implantable Pulse Generator Implant Date: 20210802

## 2021-11-24 ENCOUNTER — Other Ambulatory Visit: Payer: Self-pay | Admitting: Cardiovascular Disease

## 2021-12-14 NOTE — Progress Notes (Signed)
Carelink Summary Report / Loop Recorder 

## 2021-12-22 ENCOUNTER — Ambulatory Visit (INDEPENDENT_AMBULATORY_CARE_PROVIDER_SITE_OTHER): Payer: Medicare PPO

## 2021-12-22 DIAGNOSIS — I639 Cerebral infarction, unspecified: Secondary | ICD-10-CM

## 2021-12-23 LAB — CUP PACEART REMOTE DEVICE CHECK
Date Time Interrogation Session: 20230922230446
Implantable Pulse Generator Implant Date: 20210802

## 2021-12-31 NOTE — Progress Notes (Signed)
Carelink Summary Report / Loop Recorder 

## 2022-01-23 LAB — CUP PACEART REMOTE DEVICE CHECK
Date Time Interrogation Session: 20231025230818
Implantable Pulse Generator Implant Date: 20210802

## 2022-01-26 ENCOUNTER — Ambulatory Visit (INDEPENDENT_AMBULATORY_CARE_PROVIDER_SITE_OTHER): Payer: Medicare PPO

## 2022-01-26 DIAGNOSIS — I639 Cerebral infarction, unspecified: Secondary | ICD-10-CM | POA: Diagnosis not present

## 2022-02-28 NOTE — Progress Notes (Signed)
Carelink Summary Report / Loop Recorder 

## 2022-03-02 ENCOUNTER — Ambulatory Visit (INDEPENDENT_AMBULATORY_CARE_PROVIDER_SITE_OTHER): Payer: Medicare PPO

## 2022-03-02 DIAGNOSIS — I639 Cerebral infarction, unspecified: Secondary | ICD-10-CM | POA: Diagnosis not present

## 2022-03-02 LAB — CUP PACEART REMOTE DEVICE CHECK
Date Time Interrogation Session: 20231203230939
Implantable Pulse Generator Implant Date: 20210802

## 2022-04-06 ENCOUNTER — Ambulatory Visit (INDEPENDENT_AMBULATORY_CARE_PROVIDER_SITE_OTHER): Payer: Medicare PPO

## 2022-04-06 DIAGNOSIS — I639 Cerebral infarction, unspecified: Secondary | ICD-10-CM | POA: Diagnosis not present

## 2022-04-07 LAB — CUP PACEART REMOTE DEVICE CHECK
Date Time Interrogation Session: 20240107231402
Implantable Pulse Generator Implant Date: 20210802

## 2022-04-09 NOTE — Progress Notes (Signed)
Carelink Summary Report / Loop Recorder 

## 2022-05-10 LAB — CUP PACEART REMOTE DEVICE CHECK
Date Time Interrogation Session: 20240209230847
Implantable Pulse Generator Implant Date: 20210802

## 2022-05-11 ENCOUNTER — Ambulatory Visit: Payer: Medicare PPO

## 2022-05-11 DIAGNOSIS — I639 Cerebral infarction, unspecified: Secondary | ICD-10-CM

## 2022-05-11 NOTE — Progress Notes (Signed)
Carelink Summary Report / Loop Recorder 

## 2022-05-18 ENCOUNTER — Telehealth: Payer: Self-pay

## 2022-05-18 NOTE — Telephone Encounter (Signed)
Called main number, spoke with pt's daughter, Al Corpus. Asked if they could com e at 2pm, she was agreeable.

## 2022-05-19 ENCOUNTER — Encounter: Payer: Self-pay | Admitting: Cardiology

## 2022-05-19 ENCOUNTER — Ambulatory Visit: Payer: Medicare PPO | Attending: Cardiology | Admitting: Cardiology

## 2022-05-19 VITALS — BP 106/74 | HR 90 | Ht 64.0 in | Wt 171.6 lb

## 2022-05-19 DIAGNOSIS — E559 Vitamin D deficiency, unspecified: Secondary | ICD-10-CM

## 2022-05-19 DIAGNOSIS — G4733 Obstructive sleep apnea (adult) (pediatric): Secondary | ICD-10-CM

## 2022-05-19 DIAGNOSIS — Z8774 Personal history of (corrected) congenital malformations of heart and circulatory system: Secondary | ICD-10-CM | POA: Diagnosis not present

## 2022-05-19 DIAGNOSIS — R0609 Other forms of dyspnea: Secondary | ICD-10-CM

## 2022-05-19 DIAGNOSIS — I2510 Atherosclerotic heart disease of native coronary artery without angina pectoris: Secondary | ICD-10-CM | POA: Diagnosis not present

## 2022-05-19 DIAGNOSIS — I10 Essential (primary) hypertension: Secondary | ICD-10-CM

## 2022-05-19 DIAGNOSIS — Z8673 Personal history of transient ischemic attack (TIA), and cerebral infarction without residual deficits: Secondary | ICD-10-CM

## 2022-05-19 DIAGNOSIS — I639 Cerebral infarction, unspecified: Secondary | ICD-10-CM

## 2022-05-19 NOTE — Progress Notes (Signed)
Cardiology Office Note:    Date:  05/20/2022   ID:  Chloe Gray, DOB 28-Feb-1951, MRN [id number]  PCP:  Chloe Bushy, FNP  Cardiologist:  Chloe Ripple, DO  Electrophysiologist:  None   Referring MD: Chloe Bushy, FNP   " I am doing file"  History of Present Illness:    Chloe Gray is a 72 y.o. female with a hx of most recent CVA in December 2020, PFO status post closure with a 12 mm Amplatzer septal closure device on September 16, 2020 by Dr. Excell Gray, diastolic heart failure.    Since I saw the patient in February 2023 she had follow-up with our structural heart team.  Her echocardiogram which was done at that time showed residual/positive agitated saline study.  With plans for repeated in a year.  She is here today with her husband.  She she reports significant fatigue at times no shortness of breath no chest pain.    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   Depression    states well controlled   Essential hypertension 07/11/2019   GERD (gastroesophageal reflux disease)    H/O hiatal hernia    Hypercholesterolemia    Hypertension    clearance note with OV Dr Chloe Gray, EKG on chart 03/09/11   MCI (mild cognitive impairment) 10/14/2017   Mixed hyperlipidemia 07/11/2019   OSA (obstructive sleep apnea) 07/11/2019   Osteoarthritis of right knee 05/01/2011   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   Pneumonia    Shortness of breath    with asthma   Sleep apnea    Stroke [facility])    Thalamic infarct, 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   Vitamin B 12 deficiency     Past Surgical History:  Procedure Laterality Date   COLONOSCOPY     KNEE ARTHROSCOPY     PATENT FORAMEN OVALE(PFO) CLOSURE N/A 09/16/2020   Procedure:  PATENT FORAMEN OVALE (PFO) CLOSURE;  Surgeon: Chloe Bollman, MD;  Location: [facility] INVASIVE CV LAB;  Service: Cardiovascular;  Laterality: N/A;   TOTAL KNEE ARTHROPLASTY  04/30/2011   Procedure: TOTAL KNEE ARTHROPLASTY;  Surgeon: Chloe Docker, MD;  Location: [facility] ORS;  Service: Orthopedics;  Laterality: Right;  Femoral nerve block   TUBAL LIGATION     UMBILICAL HERNIA REPAIR      Current Medications: Current Meds  Medication Sig   albuterol (PROVENTIL) (2.5 MG/3ML) 0.083% nebulizer solution Take 2.5 mg by nebulization every 6 (six) hours as needed for wheezing or shortness of breath. For shortness of breath.   ALPRAZolam (XANAX) 1 MG tablet Take 1 mg by mouth daily as needed for anxiety.   amLODipine (NORVASC) 10 MG tablet Take 10 mg by mouth daily.   aspirin EC 81 MG tablet Take 1 tablet (81 mg total) by mouth daily. Swallow whole.   buPROPion (WELLBUTRIN XL) 150 MG 24 hr tablet Take 1 tablet (150 mg total) by mouth daily.   citalopram (CELEXA) 20 MG tablet Take 20 mg by mouth daily.   cyanocobalamin (,VITAMIN B-12,) 1000 MCG/ML injection Inject 100 mcg into the muscle every 30 (thirty) days.   famotidine (PEPCID) 20 MG tablet Take 20 mg by mouth at bedtime.   furosemide (LASIX) 20 MG tablet Take 20  mg by mouth daily as needed for fluid or edema.   ibuprofen (ADVIL) 600 MG tablet Take 1 tablet (600 mg total) by mouth every 6 (six) hours as needed for moderate pain.   lisinopril (ZESTRIL) 40 MG tablet Take 40 mg by mouth daily.   montelukast (SINGULAIR) 10 MG tablet Take 10 mg by mouth as needed.   rosuvastatin (CRESTOR) 10 MG tablet Take 10 mg by mouth at bedtime.   sodium bicarbonate 650 MG tablet Take 1,300 mg by mouth 2 (two) times daily.   traZODone (DESYREL) 100 MG tablet Take 100 mg by mouth at bedtime as needed for sleep.     Allergies:   Latex   Social History   Socioeconomic History   Marital status: Married    Spouse name: Not on file   Number of children: Not on file    Years of education: Not on file   Highest education level: Not on file  Occupational History   Not on file  Tobacco Use   Smoking status: Never   Smokeless tobacco: Never  Vaping Use   Vaping Use: Never used  Substance and Sexual Activity   Alcohol use: No   Drug use: No   Sexual activity: Not on file  Other Topics Concern   Not on file  Social History Narrative   Not on file   Social Determinants of Health   Financial Resource Strain: Not on file  Food Insecurity: Not on file  Transportation Needs: Not on file  Physical Activity: Not on file  Stress: Not on file  Social Connections: Not on file     Family History: The patient's family history includes Hypertension in her brother, father, mother, sister, sister, and sister; Stroke in her mother.  ROS:   Review of Systems  Constitution: Negative for decreased appetite, fever and weight gain.  HENT: Negative for congestion, ear discharge, hoarse voice and sore throat.   Eyes: Negative for discharge, redness, vision loss in right eye and visual halos.  Cardiovascular: Negative for chest pain, dyspnea on exertion, leg swelling, orthopnea and palpitations.  Respiratory: Negative for cough, hemoptysis, shortness of breath and snoring.   Endocrine: Negative for heat intolerance and polyphagia.  Hematologic/Lymphatic: Negative for bleeding problem. Does not bruise/bleed easily.  Skin: Negative for flushing, nail changes, rash and suspicious lesions.  Musculoskeletal: Negative for arthritis, joint pain, muscle cramps, myalgias, neck pain and stiffness.  Gastrointestinal: Negative for abdominal pain, bowel incontinence, diarrhea and excessive appetite.  Genitourinary: Negative for decreased libido, genital sores and incomplete emptying.  Neurological: Negative for brief paralysis, focal weakness, headaches and loss of balance.  Psychiatric/Behavioral: Negative for altered mental status, depression and suicidal ideas.   Allergic/Immunologic: Negative for HIV exposure and persistent infections.    EKGs/Labs/Other Studies Reviewed:    The following studies were reviewed today:   EKG:  The ekg ordered today demonstrates    TTE 10/2020 IMPRESSIONS     1. Left ventricular ejection fraction, by estimation, is 60 to 65%. The  left ventricle has normal function. The left ventricle has no regional  wall motion abnormalities. There is moderate concentric left ventricular  hypertrophy. Left ventricular  diastolic parameters are consistent with Grade I diastolic dysfunction  (impaired relaxation). The average left ventricular global longitudinal  strain is -10.2 %. The global longitudinal strain is abnormal.   2. Right ventricular systolic function is normal. The right ventricular  size is normal. There is normal pulmonary artery systolic pressure.   3. OCCLUDER  AMPLATZER SEPTAL is well visualized on 4CA without  doppler shunt.   4. The mitral valve is normal in structure. No evidence of mitral valve  regurgitation. No evidence of mitral stenosis.   5. The aortic valve is tricuspid. Aortic valve regurgitation is not  visualized. No aortic stenosis is present.   6. The inferior vena cava is normal in size with greater than 50%  respiratory variability, suggesting right atrial pressure of 3 mmHg.   FINDINGS   Left Ventricle: Left ventricular ejection fraction, by estimation, is 60  to 65%. The left ventricle has normal function. The left ventricle has no  regional wall motion abnormalities. The average left ventricular global  longitudinal strain is -10.2 %.  The global longitudinal strain is abnormal. The left ventricular internal  cavity size was normal in size. There is moderate concentric left  ventricular hypertrophy. Left ventricular diastolic parameters are  consistent with Grade I diastolic dysfunction  (impaired relaxation). Normal left ventricular filling pressure.   Right Ventricle:  The right ventricular size is normal. No increase in  right ventricular wall thickness. Right ventricular systolic function is  normal. There is normal pulmonary artery systolic pressure. The tricuspid  regurgitant velocity is 2.73 m/s, and   with an assumed right atrial pressure of 3 mmHg, the estimated right  ventricular systolic pressure is 32.8 mmHg.   Left Atrium: Left atrial size was normal in size.   Right Atrium: Right atrial size was normal in size.   Pericardium: There is no evidence of pericardial effusion.   Mitral Valve: The mitral valve is normal in structure. No evidence of  mitral valve regurgitation. No evidence of mitral valve stenosis.   Tricuspid Valve: The tricuspid valve is normal in structure. Tricuspid  valve regurgitation is mild . No evidence of tricuspid stenosis.   Aortic Valve: The aortic valve is tricuspid. Aortic valve regurgitation is  not visualized. No aortic stenosis is present.   Pulmonic Valve: The pulmonic valve was normal in structure. Pulmonic valve  regurgitation is not visualized. No evidence of pulmonic stenosis.   Aorta: The aortic arch was not well visualized and the aortic root and  ascending aorta are structurally normal, with no evidence of dilitation.   Venous: The pulmonary veins were not well visualized. The inferior vena  cava is normal in size with greater than 50% respiratory variability,  suggesting right atrial pressure of 3 mmHg.   IAS/Shunts: No atrial level shunt detected by color flow Doppler.   Recent Labs: 08/02/2021: ALT 19; BUN 16; Creatinine, Ser 1.06; Potassium 4.3; Sodium 131 05/19/2022: Hemoglobin 12.5; Platelets 321  Recent Lipid Panel    Component Value Date/Time   CHOL 146 10/14/2017 1033   TRIG 134 10/14/2017 1033   HDL 53 10/14/2017 1033   CHOLHDL 2.8 10/14/2017 1033   LDLCALC 66 10/14/2017 1033    Physical Exam:    VS:  BP 106/74   Pulse 90   Ht 5\' 4"  (1.626 m)   Wt 77.8 kg   SpO2 95%   BMI  29.46 kg/m     Wt Readings from Last 3 Encounters:  05/19/22 77.8 kg  09/17/21 78.5 kg  08/02/21 77.1 kg     GEN: Well nourished, well developed in no acute distress HEENT: Normal NECK: No JVD; No carotid bruits LYMPHATICS: No lymphadenopathy CARDIAC: S1S2 noted,RRR, no murmurs, rubs, gallops RESPIRATORY:  Clear to auscultation without rales, wheezing or rhonchi  ABDOMEN: Soft, non-tender, non-distended, +bowel sounds, no guarding. EXTREMITIES: No  edema, No cyanosis, no clubbing MUSCULOSKELETAL:  No deformity  SKIN: Warm and dry NEUROLOGIC:  Alert and oriented x 3, non-focal PSYCHIATRIC:  Normal affect, good insight  ASSESSMENT:    1. S/P patent foramen ovale closure   2. Vitamin D deficiency   3. DOE (dyspnea on exertion)   4. Coronary artery disease involving native coronary artery of native heart without angina pectoris   5. Cryptogenic stroke (HCC)   6. Primary hypertension   7. OSA (obstructive sleep apnea)   8. History of CVA (cerebrovascular accident)     PLAN:    Will schedule her repeat echocardiogram which will be a echocardiogram with agitated saline/bubble study.  This will be scheduled after September 18, 2022 at our [place] office.  Hyperlipidemia - continue with current statin medication.  Hypertension-blood pressure is acceptable, continue with current antihypertensive regimen.  The patient understands the need to lose weight with diet and exercise. We have discussed specific strategies for this.  The patient is in agreement with the above plan. The patient left the office in stable condition.  The patient will follow up in   Medication Adjustments/Labs and Tests Ordered: Current medicines are reviewed at length with the patient today.  Concerns regarding medicines are outlined above.  Orders Placed This Encounter  Procedures   VITAMIN D 25 Hydroxy (Vit-D Deficiency, Fractures)   CBC with Differential/Platelet   EKG 12-Lead   ECHOCARDIOGRAM LIMITED  BUBBLE STUDY   No orders of the defined types were placed in this encounter.   Patient Instructions  Medication Instructions:  Your physician recommends that you continue on your current medications as directed. Please refer to the Current Medication list given to you today.  *If you need a refill on your cardiac medications before your next appointment, please call your pharmacy*   Lab Work: Your physician recommends that you have labs drawn today: Vit D, CBC If you have labs (blood work) drawn today and your tests are completely normal, you will receive your results only by: MyChart Message (if you have MyChart) OR A paper copy in the mail If you have any lab test that is abnormal or we need to change your treatment, we will call you to review the results.   Testing/Procedures: Your physician has requested that you have an echocardiogram after June 21st. Echocardiography is a painless test that uses sound waves to create images of your heart. It provides your doctor with information about the size and shape of your heart and how well your heart's chambers and valves are working. This procedure takes approximately one hour. There are no restrictions for this procedure. Please do NOT wear cologne, perfume, aftershave, or lotions (deodorant is allowed). Please arrive 15 minutes prior to your appointment time.    Follow-Up: At [facility], you and your health needs are our priority.  As part of our continuing mission to provide you with exceptional heart care, we have created designated Provider Care Teams.  These Care Teams include your primary Cardiologist (physician) and Advanced Practice Providers (APPs -  Physician Assistants and Nurse Practitioners) who all work together to provide you with the care you need, when you need it.  We recommend signing up for the patient portal called "MyChart".  Sign up information is provided on this After Visit Summary.  MyChart is used to  connect with patients for Virtual Visits (Telemedicine).  Patients are able to view lab/test results, encounter notes, upcoming appointments, etc.  Non-urgent messages can be sent  to your provider as well.   To learn more about what you can do with MyChart, go to [email or web address].    Your next appointment:   1 year(s)  Provider:   Thomasene Ripple, DO     Other Instructions Mediterranean Diet A Mediterranean diet refers to food and lifestyle choices that are based on the traditions of countries located on the [place]. It focuses on eating more fruits, vegetables, whole grains, beans, nuts, seeds, and heart-healthy fats, and eating less dairy, meat, eggs, and processed foods with added sugar, salt, and fat. This way of eating has been shown to help prevent certain conditions and improve outcomes for people who have chronic diseases, like kidney disease and heart disease. What are tips for following this plan? Reading food labels Check the serving size of packaged foods. For foods such as rice and pasta, the serving size refers to the amount of cooked product, not dry. Check the total fat in packaged foods. Avoid foods that have saturated fat or trans fats. Check the ingredient list for added sugars, such as corn syrup. Shopping  Buy a variety of foods that offer a balanced diet, including: Fresh fruits and vegetables (produce). Grains, beans, nuts, and seeds. Some of these may be available in unpackaged forms or large amounts (in bulk). Fresh seafood. Poultry and eggs. Low-fat dairy products. Buy whole ingredients instead of prepackaged foods. Buy fresh fruits and vegetables in-season from local farmers markets. Buy plain frozen fruits and vegetables. If you do not have access to quality fresh seafood, buy precooked frozen shrimp or canned fish, such as tuna, salmon, or sardines. Stock your pantry so you always have certain foods on hand, such as olive oil, canned tuna, canned  tomatoes, rice, pasta, and beans. Cooking Cook foods with extra-virgin olive oil instead of using butter or other vegetable oils. Have meat as a side dish, and have vegetables or grains as your main dish. This means having meat in small portions or adding small amounts of meat to foods like pasta or stew. Use beans or vegetables instead of meat in common dishes like chili or lasagna. Experiment with different cooking methods. Try roasting, broiling, steaming, and sauting vegetables. Add frozen vegetables to soups, stews, pasta, or rice. Add nuts or seeds for added healthy fats and plant protein at each meal. You can add these to yogurt, salads, or vegetable dishes. Marinate fish or vegetables using olive oil, lemon juice, garlic, and fresh herbs. Meal planning Plan to eat one vegetarian meal one day each week. Try to work up to two vegetarian meals, if possible. Eat seafood two or more times a week. Have healthy snacks readily available, such as: Vegetable sticks with hummus. Greek yogurt. Fruit and nut trail mix. Eat balanced meals throughout the week. This includes: Fruit: 2-3 servings a day. Vegetables: 4-5 servings a day. Low-fat dairy: 2 servings a day. Fish, poultry, or lean meat: 1 serving a day. Beans and legumes: 2 or more servings a week. Nuts and seeds: 1-2 servings a day. Whole grains: 6-8 servings a day. Extra-virgin olive oil: 3-4 servings a day. Limit red meat and sweets to only a few servings a month. Lifestyle  Cook and eat meals together with your family, when possible. Drink enough fluid to keep your urine pale yellow. Be physically active every day. This includes: Aerobic exercise like running or swimming. Leisure activities like gardening, walking, or housework. Get 7-8 hours of sleep each night. If recommended by your health care provider,  drink red wine in moderation. This means 1 glass a day for nonpregnant women and 2 glasses a day for men. A glass of wine  equals 5 oz (150 mL). What foods should I eat? Fruits Apples. Apricots. Avocado. Berries. Bananas. Cherries. Dates. Figs. Grapes. Lemons. Melon. Oranges. Peaches. Plums. Pomegranate. Vegetables Artichokes. Beets. Broccoli. Cabbage. Carrots. Eggplant. Green beans. Chard. Kale. Spinach. Onions. Leeks. Peas. Squash. Tomatoes. Peppers. Radishes. Grains Whole-grain pasta. Brown rice. Bulgur wheat. Polenta. Couscous. Whole-wheat bread. Orpah Cobb. Meats and other proteins Beans. Almonds. Sunflower seeds. Pine nuts. Peanuts. Cod. Salmon. Scallops. Shrimp. Tuna. Tilapia. Clams. Oysters. Eggs. Poultry without skin. Dairy Low-fat milk. Cheese. Greek yogurt. Fats and oils Extra-virgin olive oil. Avocado oil. Grapeseed oil. Beverages Water. Red wine. Herbal tea. Sweets and desserts Greek yogurt with honey. Baked apples. Poached pears. Trail mix. Seasonings and condiments Basil. Cilantro. Coriander. Cumin. Mint. Parsley. Sage. Rosemary. Tarragon. Garlic. Oregano. Thyme. Pepper. Balsamic vinegar. Tahini. Hummus. Tomato sauce. Olives. Mushrooms. The items listed above may not be a complete list of foods and beverages you can eat. Contact a dietitian for more information. What foods should I limit? This is a list of foods that should be eaten rarely or only on special occasions. Fruits Fruit canned in syrup. Vegetables Deep-fried potatoes (french fries). Grains Prepackaged pasta or rice dishes. Prepackaged cereal with added sugar. Prepackaged snacks with added sugar. Meats and other proteins Beef. Pork. Lamb. Poultry with skin. Hot dogs. Tomasa Blase. Dairy Ice cream. Sour cream. Whole milk. Fats and oils Butter. Canola oil. Vegetable oil. Beef fat (tallow). Lard. Beverages Juice. Sugar-sweetened soft drinks. Beer. Liquor and spirits. Sweets and desserts Cookies. Cakes. Pies. Candy. Seasonings and condiments Mayonnaise. Pre-made sauces and marinades. The items listed above may not be a  complete list of foods and beverages you should limit. Contact a dietitian for more information. Summary The Mediterranean diet includes both food and lifestyle choices. Eat a variety of fresh fruits and vegetables, beans, nuts, seeds, and whole grains. Limit the amount of red meat and sweets that you eat. If recommended by your health care provider, drink red wine in moderation. This means 1 glass a day for nonpregnant women and 2 glasses a day for men. A glass of wine equals 5 oz (150 mL). This information is not intended to replace advice given to you by your health care provider. Make sure you discuss any questions you have with your health care provider. Document Revised: 04/21/2019 Document Reviewed: 02/16/2019 Elsevier Patient Education  2023 Elsevier Inc.    Adopting a Healthy Lifestyle.  Know what a healthy weight is for you (roughly BMI <25) and aim to maintain this   Aim for 7+ servings of fruits and vegetables daily   65-80+ fluid ounces of water or unsweet tea for healthy kidneys   Limit to max 1 drink of alcohol per day; avoid smoking/tobacco   Limit animal fats in diet for cholesterol and heart health - choose grass fed whenever available   Avoid highly processed foods, and foods high in saturated/trans fats   Aim for low stress - take time to unwind and care for your mental health   Aim for 150 min of moderate intensity exercise weekly for heart health, and weights twice weekly for bone health   Aim for 7-9 hours of sleep daily   When it comes to diets, agreement about the perfect plan isnt easy to find, even among the experts. Experts at the [facility] of [employer] developed an idea known  as the Healthy Eating Plate. Just imagine a plate divided into logical, healthy portions.   The emphasis is on diet quality:   Load up on vegetables and fruits - one-half of your plate: Aim for color and variety, and remember that potatoes dont count.   Go for whole  grains - one-quarter of your plate: Whole wheat, barley, wheat berries, quinoa, oats, brown rice, and foods made with them. If you want pasta, go with whole wheat pasta.   Protein power - one-quarter of your plate: Fish, chicken, beans, and nuts are all healthy, versatile protein sources. Limit red meat.   The diet, however, does go beyond the plate, offering a few other suggestions.   Use healthy plant oils, such as olive, canola, soy, corn, sunflower and peanut. Check the labels, and avoid partially hydrogenated oil, which have unhealthy trans fats.   If youre thirsty, drink water. Coffee and tea are good in moderation, but skip sugary drinks and limit milk and dairy products to one or two daily servings.   The type of carbohydrate in the diet is more important than the amount. Some sources of carbohydrates, such as vegetables, fruits, whole grains, and beans-are healthier than others.   Finally, stay active  Signed, Chloe Ripple, DO  05/20/2022 5:31 PM    [facility]

## 2022-05-19 NOTE — Patient Instructions (Addendum)
Medication Instructions:  Your physician recommends that you continue on your current medications as directed. Please refer to the Current Medication list given to you today.  *If you need a refill on your cardiac medications before your next appointment, please call your pharmacy*   Lab Work: Your physician recommends that you have labs drawn today: Vit D, CBC If you have labs (blood work) drawn today and your tests are completely normal, you will receive your results only by: [place] (if you have MyChart) OR A paper copy in the mail If you have any lab test that is abnormal or we need to change your treatment, we will call you to review the results.   Testing/Procedures: Your physician has requested that you have an echocardiogram after June 21st. Echocardiography is a painless test that uses sound waves to create images of your heart. It provides your doctor with information about the size and shape of your heart and how well your heart's chambers and valves are working. This procedure takes approximately one hour. There are no restrictions for this procedure. Please do NOT wear cologne, perfume, aftershave, or lotions (deodorant is allowed). Please arrive 15 minutes prior to your appointment time.    Follow-Up: At [facility], you and your health needs are our priority.  As part of our continuing mission to provide you with exceptional heart care, we have created designated Provider Care Teams.  These Care Teams include your primary Cardiologist (physician) and Advanced Practice Providers (APPs -  Physician Assistants and Nurse Practitioners) who all work together to provide you with the care you need, when you need it.  We recommend signing up for the patient portal called "MyChart".  Sign up information is provided on this After Visit Summary.  MyChart is used to connect with patients for Virtual Visits (Telemedicine).  Patients are able to view lab/test results,  encounter notes, upcoming appointments, etc.  Non-urgent messages can be sent to your provider as well.   To learn more about what you can do with MyChart, go to [email or web address].    Your next appointment:   1 year(s)  Provider:   Berniece Salines, DO     Other Instructions Mediterranean Diet A Mediterranean diet refers to food and lifestyle choices that are based on the traditions of countries located on the [place]. It focuses on eating more fruits, vegetables, whole grains, beans, nuts, seeds, and heart-healthy fats, and eating less dairy, meat, eggs, and processed foods with added sugar, salt, and fat. This way of eating has been shown to help prevent certain conditions and improve outcomes for people who have chronic diseases, like kidney disease and heart disease. What are tips for following this plan? Reading food labels Check the serving size of packaged foods. For foods such as rice and pasta, the serving size refers to the amount of cooked product, not dry. Check the total fat in packaged foods. Avoid foods that have saturated fat or trans fats. Check the ingredient list for added sugars, such as corn syrup. Shopping  Buy a variety of foods that offer a balanced diet, including: Fresh fruits and vegetables (produce). Grains, beans, nuts, and seeds. Some of these may be available in unpackaged forms or large amounts (in bulk). Fresh seafood. Poultry and eggs. Low-fat dairy products. Buy whole ingredients instead of prepackaged foods. Buy fresh fruits and vegetables in-season from local farmers markets. Buy plain frozen fruits and vegetables. If you do not have access to quality fresh seafood, buy  precooked frozen shrimp or canned fish, such as tuna, salmon, or sardines. Stock your pantry so you always have certain foods on hand, such as olive oil, canned tuna, canned tomatoes, rice, pasta, and beans. Cooking Cook foods with extra-virgin olive oil instead of using  butter or other vegetable oils. Have meat as a side dish, and have vegetables or grains as your main dish. This means having meat in small portions or adding small amounts of meat to foods like pasta or stew. Use beans or vegetables instead of meat in common dishes like chili or lasagna. Experiment with different cooking methods. Try roasting, broiling, steaming, and sauting vegetables. Add frozen vegetables to soups, stews, pasta, or rice. Add nuts or seeds for added healthy fats and plant protein at each meal. You can add these to yogurt, salads, or vegetable dishes. Marinate fish or vegetables using olive oil, lemon juice, garlic, and fresh herbs. Meal planning Plan to eat one vegetarian meal one day each week. Try to work up to two vegetarian meals, if possible. Eat seafood two or more times a week. Have healthy snacks readily available, such as: Vegetable sticks with hummus. Greek yogurt. Fruit and nut trail mix. Eat balanced meals throughout the week. This includes: Fruit: 2-3 servings a day. Vegetables: 4-5 servings a day. Low-fat dairy: 2 servings a day. Fish, poultry, or lean meat: 1 serving a day. Beans and legumes: 2 or more servings a week. Nuts and seeds: 1-2 servings a day. Whole grains: 6-8 servings a day. Extra-virgin olive oil: 3-4 servings a day. Limit red meat and sweets to only a few servings a month. Lifestyle  Cook and eat meals together with your family, when possible. Drink enough fluid to keep your urine pale yellow. Be physically active every day. This includes: Aerobic exercise like running or swimming. Leisure activities like gardening, walking, or housework. Get 7-8 hours of sleep each night. If recommended by your health care provider, drink red wine in moderation. This means 1 glass a day for nonpregnant women and 2 glasses a day for men. A glass of wine equals 5 oz (150 mL). What foods should I eat? Fruits Apples. Apricots. Avocado. Berries.  Bananas. Cherries. Dates. Figs. Grapes. Lemons. Melon. Oranges. Peaches. Plums. Pomegranate. Vegetables Artichokes. Beets. Broccoli. Cabbage. Carrots. Eggplant. Green beans. Chard. Kale. Spinach. Onions. Leeks. Peas. Squash. Tomatoes. Peppers. Radishes. Grains Whole-grain pasta. Brown rice. Bulgur wheat. Polenta. Couscous. Whole-wheat bread. [place]. Meats and other proteins Beans. Almonds. Sunflower seeds. Pine nuts. Peanuts. [place]. Salmon. Scallops. Shrimp. [place]. Tilapia. Clams. Oysters. Eggs. Poultry without skin. Dairy Low-fat milk. Cheese. Greek yogurt. Fats and oils Extra-virgin olive oil. Avocado oil. Grapeseed oil. Beverages Water. Red wine. Herbal tea. Sweets and desserts Greek yogurt with honey. Baked apples. Poached pears. Trail mix. Seasonings and condiments Basil. Cilantro. Coriander. Cumin. Mint. Parsley. Sage. Rosemary. Tarragon. Garlic. Oregano. Thyme. Pepper. Balsamic vinegar. Tahini. Hummus. Tomato sauce. Olives. Mushrooms. The items listed above may not be a complete list of foods and beverages you can eat. Contact a dietitian for more information. What foods should I limit? This is a list of foods that should be eaten rarely or only on special occasions. Fruits Fruit canned in syrup. Vegetables Deep-fried potatoes (french fries). Grains Prepackaged pasta or rice dishes. Prepackaged cereal with added sugar. Prepackaged snacks with added sugar. Meats and other proteins Beef. Pork. Lamb. Poultry with skin. Hot dogs. Berniece Salines. Dairy Ice cream. Sour cream. Whole milk. Fats and oils Butter. Canola oil. Vegetable oil. Beef fat (  tallow). Lard. Beverages Juice. Sugar-sweetened soft drinks. Beer. Liquor and spirits. Sweets and desserts Cookies. Cakes. Pies. Candy. Seasonings and condiments Mayonnaise. Pre-made sauces and marinades. The items listed above may not be a complete list of foods and beverages you should limit. Contact a dietitian for more  information. Summary The Mediterranean diet includes both food and lifestyle choices. Eat a variety of fresh fruits and vegetables, beans, nuts, seeds, and whole grains. Limit the amount of red meat and sweets that you eat. If recommended by your health care provider, drink red wine in moderation. This means 1 glass a day for nonpregnant women and 2 glasses a day for men. A glass of wine equals 5 oz (150 mL). This information is not intended to replace advice given to you by your health care provider. Make sure you discuss any questions you have with your health care provider. Document Revised: 04/21/2019 Document Reviewed: 02/16/2019 Elsevier Patient Education  [place].

## 2022-05-20 LAB — CBC WITH DIFFERENTIAL/PLATELET
Basophils Absolute: 0.1 10*3/uL (ref 0.0–0.2)
Basos: 1 %
EOS (ABSOLUTE): 0.5 10*3/uL — ABNORMAL HIGH (ref 0.0–0.4)
Eos: 6 %
Hematocrit: 36.2 % (ref 34.0–46.6)
Hemoglobin: 12.5 g/dL (ref 11.1–15.9)
Immature Grans (Abs): 0 10*3/uL (ref 0.0–0.1)
Immature Granulocytes: 0 %
Lymphocytes Absolute: 1.6 10*3/uL (ref 0.7–3.1)
Lymphs: 18 %
MCH: 32.8 pg (ref 26.6–33.0)
MCHC: 34.5 g/dL (ref 31.5–35.7)
MCV: 95 fL (ref 79–97)
Monocytes Absolute: 0.7 10*3/uL (ref 0.1–0.9)
Monocytes: 8 %
Neutrophils Absolute: 6.3 10*3/uL (ref 1.4–7.0)
Neutrophils: 67 %
Platelets: 321 10*3/uL (ref 150–450)
RBC: 3.81 x10E6/uL (ref 3.77–5.28)
RDW: 11.8 % (ref 11.7–15.4)
WBC: 9.3 10*3/uL (ref 3.4–10.8)

## 2022-05-20 LAB — VITAMIN D 25 HYDROXY (VIT D DEFICIENCY, FRACTURES): Vit D, 25-Hydroxy: 55.1 ng/mL (ref 30.0–100.0)

## 2022-06-12 ENCOUNTER — Telehealth: Payer: Self-pay | Admitting: Cardiology

## 2022-06-12 NOTE — Telephone Encounter (Signed)
Follow Up:      Patient said she saw her lab results on My Chart. She said she did not understand it. She would like for someone to call and explain to her please.

## 2022-06-12 NOTE — Telephone Encounter (Signed)
Call to patient and notated Labs are stable.  She ask if her Vit D level is OK and noted it was good and improved from last time.  She is very appreciative of the office calling her back.

## 2022-06-15 ENCOUNTER — Ambulatory Visit (INDEPENDENT_AMBULATORY_CARE_PROVIDER_SITE_OTHER): Payer: Medicare PPO

## 2022-06-15 DIAGNOSIS — I639 Cerebral infarction, unspecified: Secondary | ICD-10-CM

## 2022-06-16 LAB — CUP PACEART REMOTE DEVICE CHECK
Date Time Interrogation Session: 20240317231348
Implantable Pulse Generator Implant Date: 20210802

## 2022-06-24 NOTE — Progress Notes (Signed)
Carelink Summary Report / Loop Recorder 

## 2022-07-20 ENCOUNTER — Ambulatory Visit: Payer: Medicare PPO

## 2022-07-20 LAB — CUP PACEART REMOTE DEVICE CHECK
Date Time Interrogation Session: 20240419230642
Implantable Pulse Generator Implant Date: 20210802

## 2022-07-23 ENCOUNTER — Ambulatory Visit (INDEPENDENT_AMBULATORY_CARE_PROVIDER_SITE_OTHER): Payer: Medicare PPO

## 2022-07-23 DIAGNOSIS — I639 Cerebral infarction, unspecified: Secondary | ICD-10-CM

## 2022-07-27 NOTE — Progress Notes (Signed)
Carelink Summary Report / Loop Recorder 

## 2022-08-18 NOTE — Progress Notes (Signed)
Carelink Summary Report / Loop Recorder 

## 2022-08-20 LAB — CUP PACEART REMOTE DEVICE CHECK
Date Time Interrogation Session: 20240522230300
Implantable Pulse Generator Implant Date: 20210802

## 2022-08-25 ENCOUNTER — Ambulatory Visit (INDEPENDENT_AMBULATORY_CARE_PROVIDER_SITE_OTHER): Payer: Medicare PPO

## 2022-08-25 DIAGNOSIS — I639 Cerebral infarction, unspecified: Secondary | ICD-10-CM | POA: Diagnosis not present

## 2022-09-03 NOTE — Progress Notes (Unsigned)
[facility]   MULTIDISCIPLINARY HEART VALVE CLINIC                                     Cardiology Office Note:    Date:  09/03/2022   ID:  [name], DOB 06-19-1950, MRN [id number]  PCP:  Jerrye Bushy, FNP  [facility] Cardiologist:  Thomasene Ripple, DO  [facility] Electrophysiologist:  None   Referring MD: Jerrye Bushy, FNP   No chief complaint on file. ***  History of Present Illness:    Chloe Gray is a 72 y.o. female with a hx of PFO s/p closure 09/16/20. Also hx of multiple CVAs, anxiety, HTN, GERD, OSA, and CAD. She was initially referred to Dr. Excell Seltzer 08/2019 for evaluation of transcatheter PFO closure. At that time, she had a history of multiple strokes with residual deficits with memory, speech, and gait instability.  She had loop recorder implantation and also TEE demonstrating a moderate to large PFO with positive bubble study.  After discussion of PFO closure, plan was to continue monitoring her loop for possible AF as Dr. Excell Seltzer felt there was a high likelihood that she had paroxysmal atrial fibrillation as a cause of her strokes.    She was last seen by the structural heart team 09/16/2020 at which time the plan was to proceed with PFO closure. Her loop recordings were reviewed with no evidence of PAF and no indication to continue long term AC for CVA prevention.    She underwent closure 09/16/20 with 12mm Amplatzer occluder device and tolerated the procedure well. Recommendations were to continue uninterrupted DAPT with Aspirin 81mg  daily and Clopidogrel 75mg  daily for a minimum of 6 months.    She was last seen by her primary cardiologist 05/07/21 and was doing very well. Today she is here alone and denies chest pain, palpitations, SOB, dizziness, new neuro changes, or syncope.     PFO closure: Underwent PFO closure with 12 mm Amplatzer septal occluder device on 09/16/20 with Dr. Excell Seltzer. She has done well and has already transitioned off Plavix. She will  be continued on ASA 81mg  QD. She will no longer need SBE prophylaxis. Limited echo with bubble today shows small residual L to R shunt with positive bubble. This was reviewed with Dr. Excell Seltzer with plans for repeat limited echo with bubble in one year. This has been scheduled and confirmed with the patient. Plan for return visit with me 08/2022   CVA: No new neuro changes. Loop recorder with no evidence of AF thus far.     HTN: Stable, 100/70. No changes today.    HLD: Last LDL, 66 from 09/2017. See Dr. Servando Salina 04/2022.    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   Depression    states well controlled   Essential hypertension 07/11/2019   GERD (gastroesophageal reflux disease)    H/O hiatal hernia    Hypercholesterolemia    Hypertension    clearance note with OV Dr Lorin Picket, EKG on chart 03/09/11   MCI (mild cognitive impairment) 10/14/2017   Mixed hyperlipidemia 07/11/2019   OSA (obstructive sleep apnea) 07/11/2019  Osteoarthritis of right knee 05/01/2011   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   Pneumonia    Shortness of breath    with asthma   Sleep apnea    Stroke [facility])    Thalamic infarct, 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   Vitamin B 12 deficiency     Past Surgical History:  Procedure Laterality Date   COLONOSCOPY     KNEE ARTHROSCOPY     PATENT FORAMEN OVALE(PFO) CLOSURE N/A 09/16/2020   Procedure: PATENT FORAMEN OVALE (PFO) CLOSURE;  Surgeon: Tonny Bollman, MD;  Location: [facility] INVASIVE CV LAB;  Service: Cardiovascular;  Laterality: N/A;   TOTAL KNEE ARTHROPLASTY  04/30/2011   Procedure: TOTAL KNEE ARTHROPLASTY;  Surgeon: Javier Docker, MD;  Location: [facility] ORS;  Service: Orthopedics;  Laterality: Right;  Femoral  nerve block   TUBAL LIGATION     UMBILICAL HERNIA REPAIR      Current Medications: No outpatient medications have been marked as taking for the 09/07/22 encounter (Appointment) with [facility].     Allergies:   Latex   Social History   Socioeconomic History   Marital status: Married    Spouse name: Not on file   Number of children: Not on file   Years of education: Not on file   Highest education level: Not on file  Occupational History   Not on file  Tobacco Use   Smoking status: Never   Smokeless tobacco: Never  Vaping Use   Vaping Use: Never used  Substance and Sexual Activity   Alcohol use: No   Drug use: No   Sexual activity: Not on file  Other Topics Concern   Not on file  Social History Narrative   Not on file   Social Determinants of Health   Financial Resource Strain: Not on file  Food Insecurity: Not on file  Transportation Needs: Not on file  Physical Activity: Not on file  Stress: Not on file  Social Connections: Not on file     Family History: The patient's family history includes Hypertension in her brother, father, mother, sister, sister, and sister; Stroke in her mother.  ROS:   Please see the history of present illness.    All other systems reviewed and are negative.  EKGs/Labs/Other Studies Reviewed:    The following studies were reviewed today:  Limited echo with bubble 09/17/20:   1. Left ventricular ejection fraction, by estimation, is 60 to 65%. The  left ventricle has normal function. The left ventricle has no regional  wall motion abnormalities. There is moderate concentric left ventricular  hypertrophy.   2. S/P PFO closure device. Small amount of bubbles pass right to left  within two cardiac cycles at baseline, with an increased amount of bubbles  passing in a similar time with Valsalva. Evidence of atrial level shunting  detected by color flow Doppler.  Agitated saline contrast bubble study was positive  with shunting observed  within 3-6 cardiac cycles suggestive of interatrial shunt.   Conclusion(s)/Recommendation(s): Small residual left to right shunt (image  36) and positive agitated saline contrast study shows right to left shunt.  Together this suggests small residual shunt across PFO.      LAAO closure 09/16/20:   Successful transcatheter PFO closure using a 12 mm Amplatzer septal occluder device   Recommend: Limited 2D echo prior to DC ASA/clopidogrel x 6 months, then long-term ASA 81 mg    EKG:  EKG is not ordered today.    Recent Labs: 05/19/2022:  Hemoglobin 12.5; Platelets 321  Recent Lipid Panel    Component Value Date/Time   CHOL 146 10/14/2017 1033   TRIG 134 10/14/2017 1033   HDL 53 10/14/2017 1033   CHOLHDL 2.8 10/14/2017 1033   LDLCALC 66 10/14/2017 1033     Risk Assessment/Calculations:   {Does this patient have ATRIAL FIBRILLATION?:[phone]}   Physical Exam:    VS:  There were no vitals taken for this visit.    Wt Readings from Last 3 Encounters:  05/19/22 171 lb 9.6 oz (77.8 kg)  09/17/21 173 lb (78.5 kg)  08/02/21 170 lb (77.1 kg)    General: Well developed, well nourished, NAD Skin: Warm, dry, intact  Head: Normocephalic, atraumatic, sclera non-icteric, no xanthomas, clear, moist mucus membranes. Neck: Negative for carotid bruits. No JVD Lungs:Clear to ausculation bilaterally. No wheezes, rales, or rhonchi. Breathing is unlabored. Cardiovascular: RRR with S1 S2. No murmurs, rubs, gallops, or LV heave appreciated. Abdomen: Soft, non-tender, non-distended with normoactive bowel sounds. No hepatomegaly, No rebound/guarding. No obvious abdominal masses. MSK: Strength and tone appear normal for age. 5/5 in all extremities Extremities: No edema. No clubbing or cyanosis. DP/PT pulses 2+ bilaterally Neuro: Alert and oriented. No focal deficits. No facial asymmetry. MAE spontaneously. Psych: Responds to questions appropriately with normal affect.       ASSESSMENT:    No diagnosis found. PLAN:    In order of problems listed above:       {Are you ordering a CV Procedure (e.g. stress test, cath, DCCV, TEE, etc)?   Press F2        :[id number]}    Medication Adjustments/Labs and Tests Ordered: Current medicines are reviewed at length with the patient today.  Concerns regarding medicines are outlined above.  No orders of the defined types were placed in this encounter.  No orders of the defined types were placed in this encounter.   There are no Patient Instructions on file for this visit.   Signed, Georgie Chard, NP  09/03/2022 3:06 PM     [facility]

## 2022-09-07 ENCOUNTER — Ambulatory Visit (HOSPITAL_COMMUNITY): Payer: Medicare PPO | Attending: Internal Medicine

## 2022-09-07 ENCOUNTER — Ambulatory Visit (INDEPENDENT_AMBULATORY_CARE_PROVIDER_SITE_OTHER): Payer: Medicare PPO | Admitting: Cardiology

## 2022-09-07 VITALS — BP 130/80 | HR 73 | Ht 65.0 in | Wt 174.4 lb

## 2022-09-07 DIAGNOSIS — I10 Essential (primary) hypertension: Secondary | ICD-10-CM | POA: Insufficient documentation

## 2022-09-07 DIAGNOSIS — Q2112 Patent foramen ovale: Secondary | ICD-10-CM

## 2022-09-07 DIAGNOSIS — I639 Cerebral infarction, unspecified: Secondary | ICD-10-CM | POA: Insufficient documentation

## 2022-09-07 DIAGNOSIS — E7800 Pure hypercholesterolemia, unspecified: Secondary | ICD-10-CM | POA: Diagnosis present

## 2022-09-07 DIAGNOSIS — Z8774 Personal history of (corrected) congenital malformations of heart and circulatory system: Secondary | ICD-10-CM | POA: Diagnosis not present

## 2022-09-07 DIAGNOSIS — I253 Aneurysm of heart: Secondary | ICD-10-CM | POA: Diagnosis present

## 2022-09-07 LAB — ECHOCARDIOGRAM LIMITED BUBBLE STUDY
Area-P 1/2: 4.96 cm2
S' Lateral: 2.7 cm

## 2022-09-07 NOTE — Patient Instructions (Signed)
Medication Instructions:  Your physician recommends that you continue on your current medications as directed. Please refer to the Current Medication list given to you today.  *If you need a refill on your cardiac medications before your next appointment, please call your pharmacy*   Lab Work: NONE If you have labs (blood work) drawn today and your tests are completely normal, you will receive your results only by: MyChart Message (if you have MyChart) OR A paper copy in the mail If you have any lab test that is abnormal or we need to change your treatment, we will call you to review the results.   Testing/Procedures: NONE   Follow-Up: At [facility], you and your health needs are our priority.  As part of our continuing mission to provide you with exceptional heart care, we have created designated Provider Care Teams.  These Care Teams include your primary Cardiologist (physician) and Advanced Practice Providers (APPs -  Physician Assistants and Nurse Practitioners) who all work together to provide you with the care you need, when you need it.  We recommend signing up for the patient portal called "MyChart".  Sign up information is provided on this After Visit Summary.  MyChart is used to connect with patients for Virtual Visits (Telemedicine).  Patients are able to view lab/test results, encounter notes, upcoming appointments, etc.  Non-urgent messages can be sent to your provider as well.   To learn more about what you can do with MyChart, go to [email or web address].    Your next appointment:   FOLLOW-UP WITH DR. Servando Salina

## 2022-09-18 NOTE — Progress Notes (Signed)
Carelink Summary Report / Loop Recorder 

## 2022-09-22 LAB — CUP PACEART REMOTE DEVICE CHECK
Date Time Interrogation Session: 20240624230248
Implantable Pulse Generator Implant Date: 20210802

## 2022-09-28 ENCOUNTER — Telehealth: Payer: Self-pay | Admitting: Cardiology

## 2022-09-28 ENCOUNTER — Ambulatory Visit (INDEPENDENT_AMBULATORY_CARE_PROVIDER_SITE_OTHER): Payer: Medicare PPO

## 2022-09-28 DIAGNOSIS — I639 Cerebral infarction, unspecified: Secondary | ICD-10-CM | POA: Diagnosis not present

## 2022-09-28 NOTE — Telephone Encounter (Signed)
Daughter returned RN's call. 

## 2022-09-28 NOTE — Telephone Encounter (Signed)
Left voicemail message for pt daughter to call the office.

## 2022-09-29 NOTE — Telephone Encounter (Signed)
Spoke with daughter regarding echo results.  She states understanding.

## 2022-10-13 ENCOUNTER — Other Ambulatory Visit (HOSPITAL_COMMUNITY): Payer: Medicare PPO

## 2022-10-15 NOTE — Progress Notes (Signed)
Carelink Summary Report / Loop Recorder 

## 2022-11-02 ENCOUNTER — Ambulatory Visit (INDEPENDENT_AMBULATORY_CARE_PROVIDER_SITE_OTHER): Payer: Medicare PPO

## 2022-11-02 DIAGNOSIS — I639 Cerebral infarction, unspecified: Secondary | ICD-10-CM | POA: Diagnosis not present

## 2022-11-16 NOTE — Progress Notes (Signed)
Carelink Summary Report / Loop Recorder 

## 2022-12-07 ENCOUNTER — Ambulatory Visit (INDEPENDENT_AMBULATORY_CARE_PROVIDER_SITE_OTHER): Payer: Medicare PPO

## 2022-12-07 DIAGNOSIS — I639 Cerebral infarction, unspecified: Secondary | ICD-10-CM | POA: Diagnosis not present

## 2022-12-07 LAB — CUP PACEART REMOTE DEVICE CHECK
Date Time Interrogation Session: 20240906230637
Implantable Pulse Generator Implant Date: 20210802

## 2022-12-24 NOTE — Progress Notes (Signed)
Carelink Summary Report / Loop Recorder 

## 2023-01-11 ENCOUNTER — Ambulatory Visit (INDEPENDENT_AMBULATORY_CARE_PROVIDER_SITE_OTHER): Payer: Medicare PPO

## 2023-01-11 DIAGNOSIS — I639 Cerebral infarction, unspecified: Secondary | ICD-10-CM

## 2023-01-12 LAB — CUP PACEART REMOTE DEVICE CHECK
Date Time Interrogation Session: 20241013230813
Implantable Pulse Generator Implant Date: 20210802

## 2023-01-21 ENCOUNTER — Telehealth: Payer: Self-pay | Admitting: Cardiology

## 2023-01-21 NOTE — Telephone Encounter (Signed)
Pt's daughter is calling because [facility] wants to do an MRI and needs to know what kind of implant she has. Please give daughter a call

## 2023-01-22 NOTE — Telephone Encounter (Signed)
Returned call to daughter.  Advised that Pt has a Linq2.  This device is MRI conditional.  Letter sent via MyChart with MRI device clearance.

## 2023-01-26 NOTE — Progress Notes (Signed)
Carelink Summary Report / Loop Recorder 

## 2023-02-15 ENCOUNTER — Ambulatory Visit (INDEPENDENT_AMBULATORY_CARE_PROVIDER_SITE_OTHER): Payer: Medicare PPO

## 2023-02-15 DIAGNOSIS — I639 Cerebral infarction, unspecified: Secondary | ICD-10-CM | POA: Diagnosis not present

## 2023-02-15 LAB — CUP PACEART REMOTE DEVICE CHECK
Date Time Interrogation Session: 20241117230311
Implantable Pulse Generator Implant Date: 20210802

## 2023-03-11 NOTE — Progress Notes (Signed)
Carelink Summary Report / Loop Recorder 

## 2023-03-22 ENCOUNTER — Ambulatory Visit: Payer: Medicare PPO

## 2023-03-22 DIAGNOSIS — I639 Cerebral infarction, unspecified: Secondary | ICD-10-CM

## 2023-03-22 LAB — CUP PACEART REMOTE DEVICE CHECK
Date Time Interrogation Session: 20241222230031
Implantable Pulse Generator Implant Date: 20210802

## 2023-04-04 DIAGNOSIS — Z1231 Encounter for screening mammogram for malignant neoplasm of breast: Secondary | ICD-10-CM | POA: Diagnosis not present

## 2023-04-07 DIAGNOSIS — Z1231 Encounter for screening mammogram for malignant neoplasm of breast: Secondary | ICD-10-CM | POA: Diagnosis not present

## 2023-04-08 DIAGNOSIS — Z6830 Body mass index (BMI) 30.0-30.9, adult: Secondary | ICD-10-CM | POA: Diagnosis not present

## 2023-04-08 DIAGNOSIS — E782 Mixed hyperlipidemia: Secondary | ICD-10-CM | POA: Diagnosis not present

## 2023-04-08 DIAGNOSIS — I10 Essential (primary) hypertension: Secondary | ICD-10-CM | POA: Diagnosis not present

## 2023-04-08 DIAGNOSIS — Z0000 Encounter for general adult medical examination without abnormal findings: Secondary | ICD-10-CM | POA: Diagnosis not present

## 2023-04-08 DIAGNOSIS — Z1339 Encounter for screening examination for other mental health and behavioral disorders: Secondary | ICD-10-CM | POA: Diagnosis not present

## 2023-04-08 DIAGNOSIS — Z79899 Other long term (current) drug therapy: Secondary | ICD-10-CM | POA: Diagnosis not present

## 2023-04-08 DIAGNOSIS — M109 Gout, unspecified: Secondary | ICD-10-CM | POA: Diagnosis not present

## 2023-04-08 DIAGNOSIS — Z1331 Encounter for screening for depression: Secondary | ICD-10-CM | POA: Diagnosis not present

## 2023-04-26 ENCOUNTER — Ambulatory Visit (INDEPENDENT_AMBULATORY_CARE_PROVIDER_SITE_OTHER): Payer: Medicare PPO

## 2023-04-26 DIAGNOSIS — I639 Cerebral infarction, unspecified: Secondary | ICD-10-CM | POA: Diagnosis not present

## 2023-04-26 LAB — CUP PACEART REMOTE DEVICE CHECK
Date Time Interrogation Session: 20250126230417
Implantable Pulse Generator Implant Date: 20210802

## 2023-04-29 NOTE — Progress Notes (Signed)
[facility] Summary Report / Loop Recorder

## 2023-05-21 ENCOUNTER — Ambulatory Visit: Payer: Medicare PPO | Admitting: Cardiology

## 2023-05-25 ENCOUNTER — Encounter: Payer: Self-pay | Admitting: Cardiology

## 2023-05-25 ENCOUNTER — Ambulatory Visit: Payer: Medicare Other | Attending: Cardiology | Admitting: Cardiology

## 2023-05-25 VITALS — BP 100/74 | HR 72 | Ht 64.0 in | Wt 168.6 lb

## 2023-05-25 DIAGNOSIS — Q2112 Patent foramen ovale: Secondary | ICD-10-CM | POA: Insufficient documentation

## 2023-05-25 DIAGNOSIS — I2510 Atherosclerotic heart disease of native coronary artery without angina pectoris: Secondary | ICD-10-CM | POA: Insufficient documentation

## 2023-05-25 DIAGNOSIS — I639 Cerebral infarction, unspecified: Secondary | ICD-10-CM | POA: Diagnosis present

## 2023-05-25 DIAGNOSIS — I10 Essential (primary) hypertension: Secondary | ICD-10-CM | POA: Diagnosis not present

## 2023-05-25 DIAGNOSIS — I253 Aneurysm of heart: Secondary | ICD-10-CM | POA: Diagnosis present

## 2023-05-25 NOTE — Progress Notes (Signed)
 Cardiology Office Note:    Date:  05/25/2023   ID:  Chloe Gray, DOB November 21, 1950, MRN [id number]  PCP:  [facility]  Cardiologist:  Thomasene Ripple, DO  Electrophysiologist:  None   Referring MD: Jerrye Bushy, FNP   " I am doing file"  History of Present Illness:    Chloe Gray is a 73 y.o. female with a hx of most recent CVA in December 2020, PFO status post closure with a 12 mm Amplatzer septal closure device on September 16, 2020 by Dr. Excell Seltzer, diastolic heart failure.    Since her visit with me she has seen our structural clinic.  She is doing well from a cardiovascular standpoint.  She offers no specific complaints.  She is here today with her daughter.  She she reports no significant complainants at this time.     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   Depression    states well controlled   Essential hypertension 07/11/2019   GERD (gastroesophageal reflux disease)    H/O hiatal hernia    Hypercholesterolemia    Hypertension    clearance note with OV Dr Lorin Picket, EKG on chart 03/09/11   MCI (mild cognitive impairment) 10/14/2017   Mixed hyperlipidemia 07/11/2019   OSA (obstructive sleep apnea) 07/11/2019   Osteoarthritis of right knee 05/01/2011   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   Pneumonia    Shortness of breath    with asthma   Sleep apnea    Stroke [facility])    Thalamic infarct, 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   Vitamin B 12 deficiency     Past Surgical History:  Procedure Laterality Date   COLONOSCOPY     KNEE ARTHROSCOPY     PATENT FORAMEN OVALE(PFO) CLOSURE N/A 09/16/2020   Procedure: PATENT FORAMEN OVALE (PFO) CLOSURE;  Surgeon: Tonny Bollman, MD;  Location: [facility]  INVASIVE CV LAB;  Service: Cardiovascular;  Laterality: N/A;   TOTAL KNEE ARTHROPLASTY  04/30/2011   Procedure: TOTAL KNEE ARTHROPLASTY;  Surgeon: Javier Docker, MD;  Location: [facility] ORS;  Service: Orthopedics;  Laterality: Right;  Femoral nerve block   TUBAL LIGATION     UMBILICAL HERNIA REPAIR      Current Medications: Current Meds  Medication Sig   albuterol (PROVENTIL) (2.5 MG/3ML) 0.083% nebulizer solution Take 2.5 mg by nebulization every 6 (six) hours as needed for wheezing or shortness of breath. For shortness of breath.   ALPRAZolam (XANAX) 1 MG tablet Take 1 mg by mouth daily as needed for anxiety.   amLODipine (NORVASC) 10 MG tablet Take 10 mg by mouth daily.   aspirin EC 81 MG tablet Take 1 tablet (81 mg total) by mouth daily. Swallow whole.   buPROPion (WELLBUTRIN XL) 150 MG 24 hr tablet Take 1 tablet (150 mg total) by mouth daily.   citalopram (CELEXA) 20 MG tablet Take 20 mg by mouth daily.   cyanocobalamin (,VITAMIN B-12,) 1000 MCG/ML injection Inject 100 mcg into the muscle every 30 (thirty) days.   famotidine (PEPCID) 20 MG tablet Take 20 mg by mouth at bedtime.   furosemide (LASIX) 20 MG tablet Take 20 mg by mouth daily as needed for fluid or edema.   ibuprofen (ADVIL) 600 MG  tablet Take 1 tablet (600 mg total) by mouth every 6 (six) hours as needed for moderate pain.   lisinopril (ZESTRIL) 40 MG tablet Take 40 mg by mouth daily.   montelukast (SINGULAIR) 10 MG tablet Take 10 mg by mouth as needed.   rosuvastatin (CRESTOR) 10 MG tablet Take 10 mg by mouth at bedtime.   sodium bicarbonate 650 MG tablet Take 1,300 mg by mouth 2 (two) times daily.   traZODone (DESYREL) 100 MG tablet Take 100 mg by mouth at bedtime as needed for sleep.     Allergies:   Latex   Social History   Socioeconomic History   Marital status: Married    Spouse name: Not on file   Number of children: Not on file   Years of education: Not on file   Highest education level: Not on file   Occupational History   Not on file  Tobacco Use   Smoking status: Never   Smokeless tobacco: Never  Vaping Use   Vaping status: Never Used  Substance and Sexual Activity   Alcohol use: No   Drug use: No   Sexual activity: Not on file  Other Topics Concern   Not on file  Social History Narrative   Not on file   Social Drivers of Health   Financial Resource Strain: Not on file  Food Insecurity: Not on file  Transportation Needs: Not on file  Physical Activity: Not on file  Stress: Not on file  Social Connections: Not on file     Family History: The patient's family history includes Hypertension in her brother, father, mother, sister, sister, and sister; Stroke in her mother.  ROS:   Review of Systems  Constitution: Negative for decreased appetite, fever and weight gain.  HENT: Negative for congestion, ear discharge, hoarse voice and sore throat.   Eyes: Negative for discharge, redness, vision loss in right eye and visual halos.  Cardiovascular: Negative for chest pain, dyspnea on exertion, leg swelling, orthopnea and palpitations.  Respiratory: Negative for cough, hemoptysis, shortness of breath and snoring.   Endocrine: Negative for heat intolerance and polyphagia.  Hematologic/Lymphatic: Negative for bleeding problem. Does not bruise/bleed easily.  Skin: Negative for flushing, nail changes, rash and suspicious lesions.  Musculoskeletal: Negative for arthritis, joint pain, muscle cramps, myalgias, neck pain and stiffness.  Gastrointestinal: Negative for abdominal pain, bowel incontinence, diarrhea and excessive appetite.  Genitourinary: Negative for decreased libido, genital sores and incomplete emptying.  Neurological: Negative for brief paralysis, focal weakness, headaches and loss of balance.  Psychiatric/Behavioral: Negative for altered mental status, depression and suicidal ideas.  Allergic/Immunologic: Negative for HIV exposure and persistent infections.     EKGs/Labs/Other Studies Reviewed:    The following studies were reviewed today:   EKG:  The ekg ordered today demonstrates sinus rhythm, heart rate 72 bpm with possible left atrial enlargement compared to prior EKG no significant change.   TTE 10/2020 IMPRESSIONS   1. Left ventricular ejection fraction, by estimation, is 60 to 65%. The left ventricle has normal function. The left ventricle has no regional wall motion abnormalities. There is moderate concentric left ventricular hypertrophy. Left ventricular  diastolic parameters are consistent with Grade I diastolic dysfunction (impaired relaxation). The average left ventricular global longitudinal strain is -10.2 %. The global longitudinal strain is abnormal.   2. Right ventricular systolic function is normal. The right ventricular  size is normal. There is normal pulmonary artery systolic pressure.   3. OCCLUDER AMPLATZER SEPTAL is well visualized  on 4CA without  doppler shunt.   4. The mitral valve is normal in structure. No evidence of mitral valve  regurgitation. No evidence of mitral stenosis.   5. The aortic valve is tricuspid. Aortic valve regurgitation is not  visualized. No aortic stenosis is present.   6. The inferior vena cava is normal in size with greater than 50%  respiratory variability, suggesting right atrial pressure of 3 mmHg.   FINDINGS   Left Ventricle: Left ventricular ejection fraction, by estimation, is 60  to 65%. The left ventricle has normal function. The left ventricle has no  regional wall motion abnormalities. The average left ventricular global  longitudinal strain is -10.2 %.  The global longitudinal strain is abnormal. The left ventricular internal  cavity size was normal in size. There is moderate concentric left  ventricular hypertrophy. Left ventricular diastolic parameters are  consistent with Grade I diastolic dysfunction  (impaired relaxation). Normal left ventricular filling pressure.    Right Ventricle: The right ventricular size is normal. No increase in  right ventricular wall thickness. Right ventricular systolic function is  normal. There is normal pulmonary artery systolic pressure. The tricuspid  regurgitant velocity is 2.73 m/s, and   with an assumed right atrial pressure of 3 mmHg, the estimated right  ventricular systolic pressure is 32.8 mmHg.   Left Atrium: Left atrial size was normal in size.   Right Atrium: Right atrial size was normal in size.   Pericardium: There is no evidence of pericardial effusion.   Mitral Valve: The mitral valve is normal in structure. No evidence of  mitral valve regurgitation. No evidence of mitral valve stenosis.   Tricuspid Valve: The tricuspid valve is normal in structure. Tricuspid  valve regurgitation is mild . No evidence of tricuspid stenosis.   Aortic Valve: The aortic valve is tricuspid. Aortic valve regurgitation is  not visualized. No aortic stenosis is present.   Pulmonic Valve: The pulmonic valve was normal in structure. Pulmonic valve  regurgitation is not visualized. No evidence of pulmonic stenosis.   Aorta: The aortic arch was not well visualized and the aortic root and  ascending aorta are structurally normal, with no evidence of dilitation.   Venous: The pulmonary veins were not well visualized. The inferior vena  cava is normal in size with greater than 50% respiratory variability,  suggesting right atrial pressure of 3 mmHg.   IAS/Shunts: No atrial level shunt detected by color flow Doppler.   Recent Labs: No results found for requested labs within last 365 days.  Recent Lipid Panel    Component Value Date/Time   CHOL 146 10/14/2017 1033   TRIG 134 10/14/2017 1033   HDL 53 10/14/2017 1033   CHOLHDL 2.8 10/14/2017 1033   LDLCALC 66 10/14/2017 1033    Physical Exam:    VS:  BP 100/74 (BP Location: Right Arm, Patient Position: Sitting, Cuff Size: Normal)   Pulse 72   Ht 5\' 4"  (1.626 m)    Wt 168 lb 9.6 oz (76.5 kg)   SpO2 95%   BMI 28.94 kg/m     Wt Readings from Last 3 Encounters:  05/25/23 168 lb 9.6 oz (76.5 kg)  09/07/22 174 lb 6.4 oz (79.1 kg)  05/19/22 171 lb 9.6 oz (77.8 kg)     GEN: Well nourished, well developed in no acute distress HEENT: Normal NECK: No JVD; No carotid bruits LYMPHATICS: No lymphadenopathy CARDIAC: S1S2 noted,RRR, no murmurs, rubs, gallops RESPIRATORY:  Clear to auscultation without rales, wheezing  or rhonchi  ABDOMEN: Soft, non-tender, non-distended, +bowel sounds, no guarding. EXTREMITIES: No edema, No cyanosis, no clubbing MUSCULOSKELETAL:  No deformity  SKIN: Warm and dry NEUROLOGIC:  Alert and oriented x 3, non-focal PSYCHIATRIC:  Normal affect, good insight  ASSESSMENT:    1. Essential hypertension   2. Coronary artery disease involving native coronary artery of native heart without angina pectoris   3. Cryptogenic stroke (HCC)   4. PFO with atrial septal aneurysm     PLAN:    Most recent echocardiogram was in June 2024.  Will need a repeat echocardiogram in July 2025.  Hyperlipidemia - continue with current statin medication.  Hypertension-blood pressure is acceptable, continue with current antihypertensive regimen.  The patient understands the need to lose weight with diet and exercise. We have discussed specific strategies for this.  The patient is in agreement with the above plan. The patient left the office in stable condition.  The patient will follow up in   Medication Adjustments/Labs and Tests Ordered: Current medicines are reviewed at length with the patient today.  Concerns regarding medicines are outlined above.  Orders Placed This Encounter  Procedures   EKG 12-Lead   No orders of the defined types were placed in this encounter.   Patient Instructions  Medication Instructions:  Your physician recommends that you continue on your current medications as directed. Please refer to the Current Medication  list given to you today.  *If you need a refill on your cardiac medications before your next appointment, please call your pharmacy*   Follow-Up: At [facility], you and your health needs are our priority.  As part of our continuing mission to provide you with exceptional heart care, we have created designated Provider Care Teams.  These Care Teams include your primary Cardiologist (physician) and Advanced Practice Providers (APPs -  Physician Assistants and Nurse Practitioners) who all work together to provide you with the care you need, when you need it.   Your next appointment:   1 year(s)  Provider:   Thomasene Ripple, DO      Adopting a Healthy Lifestyle.  Know what a healthy weight is for you (roughly BMI <25) and aim to maintain this   Aim for 7+ servings of fruits and vegetables daily   65-80+ fluid ounces of water or unsweet tea for healthy kidneys   Limit to max 1 drink of alcohol per day; avoid smoking/tobacco   Limit animal fats in diet for cholesterol and heart health - choose grass fed whenever available   Avoid highly processed foods, and foods high in saturated/trans fats   Aim for low stress - take time to unwind and care for your mental health   Aim for 150 min of moderate intensity exercise weekly for heart health, and weights twice weekly for bone health   Aim for 7-9 hours of sleep daily   When it comes to diets, agreement about the perfect plan isnt easy to find, even among the experts. Experts at the [facility] of [employer] developed an idea known as the Healthy Eating Plate. Just imagine a plate divided into logical, healthy portions.   The emphasis is on diet quality:   Load up on vegetables and fruits - one-half of your plate: Aim for color and variety, and remember that potatoes dont count.   Go for whole grains - one-quarter of your plate: Whole wheat, barley, wheat berries, quinoa, oats, brown rice, and foods made with them. If you  want  pasta, go with whole wheat pasta.   Protein power - one-quarter of your plate: Fish, chicken, beans, and nuts are all healthy, versatile protein sources. Limit red meat.   The diet, however, does go beyond the plate, offering a few other suggestions.   Use healthy plant oils, such as olive, canola, soy, corn, sunflower and peanut. Check the labels, and avoid partially hydrogenated oil, which have unhealthy trans fats.   If youre thirsty, drink water. Coffee and tea are good in moderation, but skip sugary drinks and limit milk and dairy products to one or two daily servings.   The type of carbohydrate in the diet is more important than the amount. Some sources of carbohydrates, such as vegetables, fruits, whole grains, and beans-are healthier than others.   Finally, stay active  Signed, Thomasene Ripple, DO  05/25/2023 2:42 PM    [facility]

## 2023-05-25 NOTE — Patient Instructions (Signed)
 Medication Instructions:  Your physician recommends that you continue on your current medications as directed. Please refer to the Current Medication list given to you today.  *If you need a refill on your cardiac medications before your next appointment, please call your pharmacy*  Follow-Up: At [facility], you and your health needs are our priority.  As part of our continuing mission to provide you with exceptional heart care, we have created designated Provider Care Teams.  These Care Teams include your primary Cardiologist (physician) and Advanced Practice Providers (APPs -  Physician Assistants and Nurse Practitioners) who all work together to provide you with the care you need, when you need it.   Your next appointment:   1 year(s)  Provider:   Thomasene Ripple, DO

## 2023-05-31 ENCOUNTER — Ambulatory Visit (INDEPENDENT_AMBULATORY_CARE_PROVIDER_SITE_OTHER): Payer: Medicare PPO

## 2023-05-31 DIAGNOSIS — I639 Cerebral infarction, unspecified: Secondary | ICD-10-CM | POA: Diagnosis not present

## 2023-06-01 LAB — CUP PACEART REMOTE DEVICE CHECK
Date Time Interrogation Session: 20250302230312
Implantable Pulse Generator Implant Date: 20210802

## 2023-06-04 NOTE — Progress Notes (Signed)
 Carelink Summary Report / Loop Recorder

## 2023-07-01 NOTE — Progress Notes (Signed)
 Carelink Summary Report / Loop Recorder

## 2023-07-01 NOTE — Addendum Note (Signed)
 Addended by: Geralyn Flash D on: 07/01/2023 02:53 PM   Modules accepted: Orders

## 2023-07-05 ENCOUNTER — Ambulatory Visit (INDEPENDENT_AMBULATORY_CARE_PROVIDER_SITE_OTHER): Payer: Medicare PPO

## 2023-07-05 DIAGNOSIS — I639 Cerebral infarction, unspecified: Secondary | ICD-10-CM

## 2023-07-06 LAB — CUP PACEART REMOTE DEVICE CHECK
Date Time Interrogation Session: 20250406230049
Implantable Pulse Generator Implant Date: 20210802

## 2023-08-09 ENCOUNTER — Ambulatory Visit (INDEPENDENT_AMBULATORY_CARE_PROVIDER_SITE_OTHER): Payer: Medicare PPO

## 2023-08-09 DIAGNOSIS — I639 Cerebral infarction, unspecified: Secondary | ICD-10-CM

## 2023-08-09 LAB — CUP PACEART REMOTE DEVICE CHECK
Date Time Interrogation Session: 20250511233326
Implantable Pulse Generator Implant Date: 20210802

## 2023-08-11 ENCOUNTER — Ambulatory Visit: Payer: Self-pay | Admitting: Cardiology

## 2023-08-18 NOTE — Addendum Note (Signed)
 Addended by: Edra Govern D on: 08/18/2023 10:13 AM   Modules accepted: Orders

## 2023-08-18 NOTE — Progress Notes (Signed)
 Carelink Summary Report / Loop Recorder

## 2023-09-09 ENCOUNTER — Ambulatory Visit (INDEPENDENT_AMBULATORY_CARE_PROVIDER_SITE_OTHER): Payer: Self-pay

## 2023-09-09 DIAGNOSIS — I639 Cerebral infarction, unspecified: Secondary | ICD-10-CM | POA: Diagnosis not present

## 2023-09-14 ENCOUNTER — Ambulatory Visit: Payer: Self-pay | Admitting: Cardiology

## 2023-09-27 NOTE — Progress Notes (Signed)
 Carelink Summary Report / Loop Recorder

## 2023-10-11 ENCOUNTER — Ambulatory Visit: Payer: Self-pay

## 2023-10-11 DIAGNOSIS — I639 Cerebral infarction, unspecified: Secondary | ICD-10-CM

## 2023-10-11 LAB — CUP PACEART REMOTE DEVICE CHECK
Date Time Interrogation Session: 20250713230119
Implantable Pulse Generator Implant Date: 20210802

## 2023-10-12 ENCOUNTER — Ambulatory Visit: Payer: Self-pay | Admitting: Cardiology

## 2023-11-01 NOTE — Progress Notes (Signed)
 Carelink Summary Report / Loop Recorder

## 2023-11-01 NOTE — Addendum Note (Signed)
 Addended by: VICCI SELLER A on: 11/01/2023 10:46 AM   Modules accepted: Orders

## 2023-11-11 ENCOUNTER — Ambulatory Visit (INDEPENDENT_AMBULATORY_CARE_PROVIDER_SITE_OTHER): Payer: Self-pay

## 2023-11-11 DIAGNOSIS — I639 Cerebral infarction, unspecified: Secondary | ICD-10-CM

## 2023-11-11 LAB — CUP PACEART REMOTE DEVICE CHECK
Date Time Interrogation Session: 20250813231029
Implantable Pulse Generator Implant Date: 20210802

## 2023-11-13 ENCOUNTER — Ambulatory Visit: Payer: Self-pay | Admitting: Cardiology

## 2023-12-13 ENCOUNTER — Ambulatory Visit (INDEPENDENT_AMBULATORY_CARE_PROVIDER_SITE_OTHER): Payer: Self-pay

## 2023-12-13 DIAGNOSIS — I639 Cerebral infarction, unspecified: Secondary | ICD-10-CM

## 2023-12-14 LAB — CUP PACEART REMOTE DEVICE CHECK
Date Time Interrogation Session: 20250914230604
Implantable Pulse Generator Implant Date: 20210802

## 2023-12-20 ENCOUNTER — Ambulatory Visit: Payer: Self-pay | Admitting: Cardiology

## 2023-12-20 NOTE — Progress Notes (Signed)
 Remote Loop Recorder Transmission

## 2023-12-23 NOTE — Progress Notes (Signed)
 Remote Loop Recorder Transmission

## 2024-01-05 NOTE — Progress Notes (Signed)
 Remote Loop Recorder Transmission

## 2024-01-13 ENCOUNTER — Ambulatory Visit: Payer: Self-pay

## 2024-01-13 DIAGNOSIS — I639 Cerebral infarction, unspecified: Secondary | ICD-10-CM

## 2024-01-13 LAB — CUP PACEART REMOTE DEVICE CHECK
Date Time Interrogation Session: 20251015230926
Implantable Pulse Generator Implant Date: 20210802

## 2024-01-19 NOTE — Progress Notes (Signed)
 Remote Loop Recorder Transmission

## 2024-01-24 ENCOUNTER — Ambulatory Visit: Payer: Self-pay | Admitting: Cardiology

## 2024-02-15 ENCOUNTER — Ambulatory Visit: Attending: Cardiology

## 2024-02-15 DIAGNOSIS — I639 Cerebral infarction, unspecified: Secondary | ICD-10-CM | POA: Diagnosis not present

## 2024-02-16 ENCOUNTER — Ambulatory Visit: Payer: Self-pay | Admitting: Cardiology

## 2024-02-16 LAB — CUP PACEART REMOTE DEVICE CHECK
Date Time Interrogation Session: 20251117230833
Implantable Pulse Generator Implant Date: 20210802

## 2024-02-18 NOTE — Progress Notes (Signed)
 Remote Loop Recorder Transmission

## 2024-03-17 ENCOUNTER — Ambulatory Visit

## 2024-03-17 DIAGNOSIS — I639 Cerebral infarction, unspecified: Secondary | ICD-10-CM

## 2024-03-17 LAB — CUP PACEART REMOTE DEVICE CHECK
Date Time Interrogation Session: 20251218230501
Implantable Pulse Generator Implant Date: 20210802

## 2024-03-20 NOTE — Progress Notes (Signed)
 Remote Loop Recorder Transmission

## 2024-03-24 ENCOUNTER — Ambulatory Visit: Payer: Self-pay | Admitting: Cardiology

## 2024-04-17 ENCOUNTER — Ambulatory Visit: Attending: Cardiology

## 2024-04-17 DIAGNOSIS — I639 Cerebral infarction, unspecified: Secondary | ICD-10-CM | POA: Diagnosis not present

## 2024-04-18 ENCOUNTER — Ambulatory Visit: Payer: Self-pay | Admitting: Cardiology

## 2024-04-18 LAB — CUP PACEART REMOTE DEVICE CHECK
Date Time Interrogation Session: 20260118230244
Implantable Pulse Generator Implant Date: 20210802

## 2024-04-21 NOTE — Progress Notes (Signed)
 Remote Loop Recorder Transmission

## 2024-05-18 ENCOUNTER — Ambulatory Visit

## 2024-06-18 ENCOUNTER — Ambulatory Visit

## 2024-07-19 ENCOUNTER — Ambulatory Visit

## 2024-08-19 ENCOUNTER — Ambulatory Visit

## 2024-09-19 ENCOUNTER — Ambulatory Visit

## 2024-10-20 ENCOUNTER — Ambulatory Visit

## 2024-11-20 ENCOUNTER — Ambulatory Visit

## 2024-12-21 ENCOUNTER — Ambulatory Visit

## 2025-01-21 ENCOUNTER — Ambulatory Visit

## 2025-02-21 ENCOUNTER — Ambulatory Visit

## 2025-03-24 ENCOUNTER — Ambulatory Visit

## 2025-04-24 ENCOUNTER — Ambulatory Visit
